# Patient Record
Sex: Male | Born: 1940 | Race: White | Hispanic: No | Marital: Married | Smoking: Never smoker
Health system: Southern US, Community
[De-identification: ages and names within clinical notes are randomized; demographics above are authoritative.]

## PROBLEM LIST (undated history)

## (undated) DIAGNOSIS — I2510 Atherosclerotic heart disease of native coronary artery without angina pectoris: Secondary | ICD-10-CM

## (undated) DIAGNOSIS — C4491 Basal cell carcinoma of skin, unspecified: Secondary | ICD-10-CM

## (undated) DIAGNOSIS — K469 Unspecified abdominal hernia without obstruction or gangrene: Secondary | ICD-10-CM

## (undated) DIAGNOSIS — E785 Hyperlipidemia, unspecified: Secondary | ICD-10-CM

## (undated) DIAGNOSIS — I10 Essential (primary) hypertension: Secondary | ICD-10-CM

## (undated) DIAGNOSIS — K649 Unspecified hemorrhoids: Secondary | ICD-10-CM

## (undated) DIAGNOSIS — M1990 Unspecified osteoarthritis, unspecified site: Secondary | ICD-10-CM

## (undated) DIAGNOSIS — E119 Type 2 diabetes mellitus without complications: Secondary | ICD-10-CM

## (undated) HISTORY — PX: COLONOSCOPY: SHX174

## (undated) HISTORY — PX: HERNIA REPAIR: SHX51

## (undated) HISTORY — PX: BASAL CELL CARCINOMA EXCISION: SHX1214

## (undated) HISTORY — DX: Essential (primary) hypertension: I10

## (undated) HISTORY — DX: Type 2 diabetes mellitus without complications: E11.9

## (undated) HISTORY — DX: Hyperlipidemia, unspecified: E78.5

## (undated) HISTORY — PX: HYDROCELE EXCISION / REPAIR: SUR1145

## (undated) SURGERY — CORONARY ARTERY BYPASS GRAFTING (CABG)
Anesthesia: General | Site: Chest

---

## 2009-09-24 ENCOUNTER — Ambulatory Visit: Payer: Self-pay | Admitting: Gastroenterology

## 2014-10-17 ENCOUNTER — Other Ambulatory Visit: Payer: Self-pay | Admitting: Family Medicine

## 2015-01-14 DIAGNOSIS — I10 Essential (primary) hypertension: Secondary | ICD-10-CM | POA: Insufficient documentation

## 2015-01-14 DIAGNOSIS — E1169 Type 2 diabetes mellitus with other specified complication: Secondary | ICD-10-CM | POA: Insufficient documentation

## 2015-01-22 ENCOUNTER — Other Ambulatory Visit: Payer: Self-pay | Admitting: Family Medicine

## 2015-01-22 ENCOUNTER — Ambulatory Visit (INDEPENDENT_AMBULATORY_CARE_PROVIDER_SITE_OTHER): Payer: Medicare Other | Admitting: Family Medicine

## 2015-01-22 ENCOUNTER — Encounter: Payer: Self-pay | Admitting: Family Medicine

## 2015-01-22 VITALS — BP 160/74 | HR 64 | Temp 97.9°F | Ht 70.2 in | Wt 197.0 lb

## 2015-01-22 DIAGNOSIS — Z0000 Encounter for general adult medical examination without abnormal findings: Secondary | ICD-10-CM

## 2015-01-22 DIAGNOSIS — E119 Type 2 diabetes mellitus without complications: Secondary | ICD-10-CM

## 2015-01-22 DIAGNOSIS — I10 Essential (primary) hypertension: Secondary | ICD-10-CM

## 2015-01-22 DIAGNOSIS — E785 Hyperlipidemia, unspecified: Secondary | ICD-10-CM

## 2015-01-22 DIAGNOSIS — Z23 Encounter for immunization: Secondary | ICD-10-CM

## 2015-01-22 LAB — MICROSCOPIC EXAMINATION: Renal Epithel, UA: NONE SEEN /hpf

## 2015-01-22 LAB — URINALYSIS, ROUTINE W REFLEX MICROSCOPIC
BILIRUBIN UA: NEGATIVE
Glucose, UA: NEGATIVE
Leukocytes, UA: NEGATIVE
Nitrite, UA: NEGATIVE
PROTEIN UA: NEGATIVE
Specific Gravity, UA: 1.03 (ref 1.005–1.030)
UUROB: 0.2 mg/dL (ref 0.2–1.0)
pH, UA: 5 (ref 5.0–7.5)

## 2015-01-22 LAB — BAYER DCA HB A1C WAIVED: HB A1C: 6.7 % (ref <7.0)

## 2015-01-22 MED ORDER — PIOGLITAZONE HCL 45 MG PO TABS: 45.0000 mg | ORAL_TABLET | Freq: Every day | ORAL | Status: DC

## 2015-01-22 MED ORDER — GLIPIZIDE-METFORMIN HCL 2.5-500 MG PO TABS
2.0000 | ORAL_TABLET | Freq: Two times a day (BID) | ORAL | Status: DC
Start: 2015-01-22 — End: 2015-08-02

## 2015-01-22 MED ORDER — EZETIMIBE-SIMVASTATIN 10-40 MG PO TABS: 1.0000 | ORAL_TABLET | Freq: Every day | ORAL | Status: DC

## 2015-01-22 MED ORDER — QUINAPRIL HCL 40 MG PO TABS: 40.0000 mg | ORAL_TABLET | Freq: Every day | ORAL | Status: DC

## 2015-01-22 MED ORDER — AMLODIPINE BESYLATE 5 MG PO TABS: 5.0000 mg | ORAL_TABLET | Freq: Every day | ORAL | Status: DC

## 2015-01-22 NOTE — Assessment & Plan Note (Signed)
Poor control will increase amlodipine from 2.5 to 5 recheck 1 month blood pressure

## 2015-01-22 NOTE — Progress Notes (Signed)
BP 160/74 mmHg  Pulse 64  Temp(Src) 97.9 F (36.6 C)  Ht 5' 10.2" (1.783 m)  Wt 197 lb (89.359 kg)  BMI 28.11 kg/m2  SpO2 99%   Subjective:    Patient ID: Adam Frost, male    DOB: 1940-11-01, 74 y.o.   MRN: [id number]  HPI: Adam Frost is a 74 y.o. male  Chief Complaint  Patient presents with  . Annual Exam   patient also follow-up diabetes which is been doing well no complaints no hypoglycemic spells not checking blood sugars at home. Hypercholesterol doing well no side effects from medications takes faithfully Blood pressure stopped metoprolol pulse is come up some has not been checking blood pressure at home the blood pressure noted to be high here. Takes his other medicines faithfully without problems.  Note some hemorrhoids intermittently with some bright red blood with bleeding very slight  Relevant past medical, surgical, family and social history reviewed and updated as indicated. Interim medical history since our last visit reviewed. Allergies and medications reviewed and updated.  Review of Systems  Constitutional: Negative.   HENT: Negative.   Eyes: Negative.   Respiratory: Negative.   Cardiovascular: Negative.   Gastrointestinal: Negative.   Endocrine: Negative.   Genitourinary: Negative.   Musculoskeletal: Negative.   Skin: Negative.   Allergic/Immunologic: Negative.   Neurological: Negative.   Hematological: Negative.   Psychiatric/Behavioral: Negative.     Per HPI unless specifically indicated above     Objective:    BP 160/74 mmHg  Pulse 64  Temp(Src) 97.9 F (36.6 C)  Ht 5' 10.2" (1.783 m)  Wt 197 lb (89.359 kg)  BMI 28.11 kg/m2  SpO2 99%  Wt Readings from Last 3 Encounters:  01/22/15 197 lb (89.359 kg)  10/01/14 197 lb (89.359 kg)    Physical Exam  Constitutional: He is oriented to person, place, and time. He appears well-developed and well-nourished.  HENT:  Head: Normocephalic and atraumatic.  Right Ear: External ear normal.   Left Ear: External ear normal.  Eyes: Conjunctivae and EOM are normal. Pupils are equal, round, and reactive to light.  Neck: Normal range of motion. Neck supple.  Cardiovascular: Normal rate, regular rhythm, normal heart sounds and intact distal pulses.   Pulmonary/Chest: Effort normal and breath sounds normal.  Abdominal: Soft. Bowel sounds are normal. There is no splenomegaly or hepatomegaly.  Genitourinary: Rectum normal and penis normal. Prostate is enlarged.  Inflamed hemorrhoidal skin tag with bleeding point identified externally  Musculoskeletal: Normal range of motion.  Neurological: He is alert and oriented to person, place, and time. He has normal reflexes.  Skin: No rash noted. No erythema.  Psychiatric: He has a normal mood and affect. His behavior is normal. Judgment and thought content normal.    No results found for this or any previous visit.    Assessment & Plan:   Problem List Items Addressed This Visit      Cardiovascular and Mediastinum   Hypertension    Poor control will increase amlodipine from 2.5 to 5 recheck 1 month blood pressure      Relevant Medications   quinapril (ACCUPRIL) 40 MG tablet   ezetimibe-simvastatin (VYTORIN) 10-40 MG per tablet   amLODipine (NORVASC) 5 MG tablet   Other Relevant Orders   CBC with Differential/Platelet   Comprehensive metabolic panel   TSH   Urinalysis, Routine w reflex microscopic (not at [facility])     Endocrine   Diabetes mellitus without complication   Relevant Medications  quinapril (ACCUPRIL) 40 MG tablet   pioglitazone (ACTOS) 45 MG tablet   glipiZIDE-metformin (METAGLIP) 2.5-500 MG per tablet   Other Relevant Orders   Bayer DCA Hb A1c Waived     Other   Hyperlipidemia    The current medical regimen is effective;  continue present plan and medications.       Relevant Medications   quinapril (ACCUPRIL) 40 MG tablet   ezetimibe-simvastatin (VYTORIN) 10-40 MG per tablet   amLODipine (NORVASC) 5 MG tablet    Other Relevant Orders   Lipid panel   Comprehensive metabolic panel   TSH    Other Visit Diagnoses    Immunization due    -  Primary    Relevant Orders    Flu Vaccine QUAD 36+ mos PF IM (Fluarix & Fluzone Quad PF) (Completed)    PE (physical exam), annual        Relevant Orders    Lipid panel    CBC with Differential/Platelet    Comprehensive metabolic panel    TSH    Urinalysis, Routine w reflex microscopic (not at [facility])    PSA        Follow up plan: Return in about 4 weeks (around 02/19/2015), or if symptoms worsen or fail to improve, for BP check BMP.

## 2015-01-22 NOTE — Assessment & Plan Note (Signed)
The current medical regimen is effective;  continue present plan and medications.  

## 2015-01-23 ENCOUNTER — Encounter: Payer: Self-pay | Admitting: Family Medicine

## 2015-01-23 LAB — CBC WITH DIFFERENTIAL/PLATELET
BASOS: 1 %
Basophils Absolute: 0 10*3/uL (ref 0.0–0.2)
EOS (ABSOLUTE): 0.1 10*3/uL (ref 0.0–0.4)
EOS: 2 %
HEMATOCRIT: 44.1 % (ref 37.5–51.0)
Hemoglobin: 14.5 g/dL (ref 12.6–17.7)
Immature Grans (Abs): 0 10*3/uL (ref 0.0–0.1)
Immature Granulocytes: 0 %
LYMPHS ABS: 0.7 10*3/uL (ref 0.7–3.1)
Lymphs: 20 %
MCH: 30.7 pg (ref 26.6–33.0)
MCHC: 32.9 g/dL (ref 31.5–35.7)
MCV: 93 fL (ref 79–97)
MONOS ABS: 0.3 10*3/uL (ref 0.1–0.9)
Monocytes: 10 %
Neutrophils Absolute: 2.2 10*3/uL (ref 1.4–7.0)
Neutrophils: 67 %
Platelets: 151 10*3/uL (ref 150–379)
RBC: 4.73 x10E6/uL (ref 4.14–5.80)
RDW: 14.2 % (ref 12.3–15.4)
WBC: 3.3 10*3/uL — ABNORMAL LOW (ref 3.4–10.8)

## 2015-01-23 LAB — COMPREHENSIVE METABOLIC PANEL
ALK PHOS: 73 IU/L (ref 39–117)
ALT: 13 IU/L (ref 0–44)
AST: 20 IU/L (ref 0–40)
Albumin/Globulin Ratio: 1.6 (ref 1.1–2.5)
Albumin: 4.1 g/dL (ref 3.5–4.8)
BUN/Creatinine Ratio: 14 (ref 10–22)
BUN: 13 mg/dL (ref 8–27)
Bilirubin Total: 0.9 mg/dL (ref 0.0–1.2)
CO2: 25 mmol/L (ref 18–29)
Calcium: 9.4 mg/dL (ref 8.6–10.2)
Chloride: 99 mmol/L (ref 97–108)
Creatinine, Ser: 0.93 mg/dL (ref 0.76–1.27)
GFR calc Af Amer: 92 mL/min/{1.73_m2} (ref >59)
GFR, EST NON AFRICAN AMERICAN: 79 mL/min/{1.73_m2} (ref >59)
GLOBULIN, TOTAL: 2.5 g/dL (ref 1.5–4.5)
Glucose: 119 mg/dL — ABNORMAL HIGH (ref 65–99)
Potassium: 4 mmol/L (ref 3.5–5.2)
SODIUM: 140 mmol/L (ref 134–144)
Total Protein: 6.6 g/dL (ref 6.0–8.5)

## 2015-01-23 LAB — LIPID PANEL
CHOLESTEROL TOTAL: 137 mg/dL (ref 100–199)
Chol/HDL Ratio: 3 ratio units (ref 0.0–5.0)
HDL: 45 mg/dL (ref >39)
LDL Calculated: 76 mg/dL (ref 0–99)
Triglycerides: 78 mg/dL (ref 0–149)
VLDL CHOLESTEROL CAL: 16 mg/dL (ref 5–40)

## 2015-01-23 LAB — TSH: TSH: 2.17 u[IU]/mL (ref 0.450–4.500)

## 2015-01-23 LAB — PSA: PROSTATE SPECIFIC AG, SERUM: 0.4 ng/mL (ref 0.0–4.0)

## 2015-02-17 ENCOUNTER — Encounter: Payer: Self-pay | Admitting: Family Medicine

## 2015-02-17 ENCOUNTER — Ambulatory Visit (INDEPENDENT_AMBULATORY_CARE_PROVIDER_SITE_OTHER): Payer: Medicare Other | Admitting: Family Medicine

## 2015-02-17 VITALS — BP 103/62 | HR 65 | Temp 98.7°F | Ht 71.0 in | Wt 198.0 lb

## 2015-02-17 DIAGNOSIS — Z1211 Encounter for screening for malignant neoplasm of colon: Secondary | ICD-10-CM

## 2015-02-17 DIAGNOSIS — I10 Essential (primary) hypertension: Secondary | ICD-10-CM

## 2015-02-17 LAB — MICROALBUMIN, URINE WAIVED
Creatinine, Urine Waived: 300 mg/dL (ref 10–300)
Microalb, Ur Waived: 150 mg/L — ABNORMAL HIGH (ref 0–19)

## 2015-02-17 NOTE — Progress Notes (Signed)
BP 103/62 mmHg  Pulse 65  Temp(Src) 98.7 F (37.1 C)  Ht 5\' 11"  (1.803 m)  Wt 198 lb (89.812 kg)  BMI 27.63 kg/m2  SpO2 99%   Subjective:    Patient ID: Adam Frost, male    DOB: 1940-08-08, 74 y.o.   MRN: [id number]  HPI: Adam Frost is a 74 y.o. male  Chief Complaint  Patient presents with  . Hypertension   patient doing well with change of amlodipine from 2.5 mg a day to 5 mg a day.  No low blood pressure spells, no lightheadedness or dizzy. Taking medications faithfully no ankle swelling. Diabetes remains doing well with no complaints or problems no change with change in medicine. Lipids also remained doing well with no complaints or problems with medicines. Relevant past medical, surgical, family and social history reviewed and updated as indicated. Interim medical history since our last visit reviewed. Allergies and medications reviewed and updated.  Review of Systems  Constitutional: Negative.   Respiratory: Negative.   Cardiovascular: Negative.     Per HPI unless specifically indicated above     Objective:    BP 103/62 mmHg  Pulse 65  Temp(Src) 98.7 F (37.1 C)  Ht 5\' 11"  (1.803 m)  Wt 198 lb (89.812 kg)  BMI 27.63 kg/m2  SpO2 99%  Wt Readings from Last 3 Encounters:  02/17/15 198 lb (89.812 kg)  01/22/15 197 lb (89.359 kg)  10/01/14 197 lb (89.359 kg)    Physical Exam  Constitutional: He is oriented to person, place, and time. He appears well-developed and well-nourished. No distress.  HENT:  Head: Normocephalic and atraumatic.  Right Ear: Hearing normal.  Left Ear: Hearing normal.  Nose: Nose normal.  Eyes: Conjunctivae and lids are normal. Right eye exhibits no discharge. Left eye exhibits no discharge. No scleral icterus.  Cardiovascular: Normal rate, regular rhythm and normal heart sounds.   Pulmonary/Chest: Effort normal and breath sounds normal. No respiratory distress.  Musculoskeletal: Normal range of motion.  Neurological: He is  alert and oriented to person, place, and time.  Skin: Skin is intact. No rash noted.  Psychiatric: He has a normal mood and affect. His speech is normal and behavior is normal. Judgment and thought content normal. Cognition and memory are normal.    Results for orders placed or performed in visit on 01/22/15  Microscopic Examination  Result Value Ref Range   WBC, UA 0-5 0 -  5 /hpf   RBC, UA 0-2 0 -  2 /hpf   Epithelial Cells (non renal) 0-10 0 - 10 /hpf   Renal Epithel, UA None seen None seen /hpf   Casts Present (A) None seen /lpf   Cast Type Granular casts N/A   Mucus, UA Present Not Estab.   Bacteria, UA Few None seen/Few  Bayer DCA Hb A1c Waived  Result Value Ref Range   Bayer DCA Hb A1c Waived 6.7 <7.0 %  Lipid panel  Result Value Ref Range   Cholesterol, Total 137 100 - 199 mg/dL   Triglycerides 78 0 - 149 mg/dL   HDL 45 >39 mg/dL   VLDL Cholesterol Cal 16 5 - 40 mg/dL   LDL Calculated 76 0 - 99 mg/dL   Chol/HDL Ratio 3.0 0.0 - 5.0 ratio units  CBC with Differential/Platelet  Result Value Ref Range   WBC 3.3 (L) 3.4 - 10.8 x10E3/uL   RBC 4.73 4.14 - 5.80 x10E6/uL   Hemoglobin 14.5 12.6 - 17.7 g/dL  Hematocrit 44.1 37.5 - 51.0 %   MCV 93 79 - 97 fL   MCH 30.7 26.6 - 33.0 pg   MCHC 32.9 31.5 - 35.7 g/dL   RDW 14.2 12.3 - 15.4 %   Platelets 151 150 - 379 x10E3/uL   Neutrophils 67 %   Lymphs 20 %   Monocytes 10 %   Eos 2 %   Basos 1 %   Neutrophils Absolute 2.2 1.4 - 7.0 x10E3/uL   Lymphocytes Absolute 0.7 0.7 - 3.1 x10E3/uL   Monocytes Absolute 0.3 0.1 - 0.9 x10E3/uL   EOS (ABSOLUTE) 0.1 0.0 - 0.4 x10E3/uL   Basophils Absolute 0.0 0.0 - 0.2 x10E3/uL   Immature Granulocytes 0 %   Immature Grans (Abs) 0.0 0.0 - 0.1 x10E3/uL  Comprehensive metabolic panel  Result Value Ref Range   Glucose 119 (H) 65 - 99 mg/dL   BUN 13 8 - 27 mg/dL   Creatinine, Ser 0.93 0.76 - 1.27 mg/dL   GFR calc non Af Amer 79 >59 mL/min/1.73   GFR calc Af Amer 92 >59 mL/min/1.73    BUN/Creatinine Ratio 14 10 - 22   Sodium 140 134 - 144 mmol/L   Potassium 4.0 3.5 - 5.2 mmol/L   Chloride 99 97 - 108 mmol/L   CO2 25 18 - 29 mmol/L   Calcium 9.4 8.6 - 10.2 mg/dL   Total Protein 6.6 6.0 - 8.5 g/dL   Albumin 4.1 3.5 - 4.8 g/dL   Globulin, Total 2.5 1.5 - 4.5 g/dL   Albumin/Globulin Ratio 1.6 1.1 - 2.5   Bilirubin Total 0.9 0.0 - 1.2 mg/dL   Alkaline Phosphatase 73 39 - 117 IU/L   AST 20 0 - 40 IU/L   ALT 13 0 - 44 IU/L  TSH  Result Value Ref Range   TSH 2.170 0.450 - 4.500 uIU/mL  Urinalysis, Routine w reflex microscopic (not at [facility])  Result Value Ref Range   Specific Gravity, UA 1.030 1.005 - 1.030   pH, UA 5.0 5.0 - 7.5   Color, UA Yellow Yellow   Appearance Ur Clear Clear   Leukocytes, UA Negative Negative   Protein, UA Negative Negative/Trace   Glucose, UA Negative Negative   Ketones, UA Trace (A) Negative   RBC, UA Trace (A) Negative   Bilirubin, UA Negative Negative   Urobilinogen, Ur 0.2 0.2 - 1.0 mg/dL   Nitrite, UA Negative Negative   Microscopic Examination See below:   PSA  Result Value Ref Range   Prostate Specific Ag, Serum 0.4 0.0 - 4.0 ng/mL      Assessment & Plan:   Problem List Items Addressed This Visit      Cardiovascular and Mediastinum   Essential hypertension    The current medical regimen is effective;  continue present plan and medications.        Other Visit Diagnoses    Essential hypertension, benign    -  Primary    Relevant Orders    Basic metabolic panel    Microalbumin, Urine Waived    Colon cancer screening        Relevant Orders    Ambulatory referral to Gastroenterology        Follow up plan: Return in about 2 months (around 04/19/2015), or if symptoms worsen or fail to improve, for Recheck medication medical problems and hemoglobin A1c.

## 2015-02-17 NOTE — Assessment & Plan Note (Signed)
The current medical regimen is effective;  continue present plan and medications.  

## 2015-02-18 ENCOUNTER — Encounter: Payer: Self-pay | Admitting: Family Medicine

## 2015-02-18 LAB — BASIC METABOLIC PANEL
BUN/Creatinine Ratio: 15 (ref 10–22)
BUN: 15 mg/dL (ref 8–27)
CALCIUM: 9.6 mg/dL (ref 8.6–10.2)
CO2: 24 mmol/L (ref 18–29)
CREATININE: 1 mg/dL (ref 0.76–1.27)
Chloride: 101 mmol/L (ref 97–106)
GFR calc Af Amer: 84 mL/min/{1.73_m2} (ref >59)
GFR, EST NON AFRICAN AMERICAN: 73 mL/min/{1.73_m2} (ref >59)
Glucose: 180 mg/dL — ABNORMAL HIGH (ref 65–99)
Potassium: 4 mmol/L (ref 3.5–5.2)
Sodium: 139 mmol/L (ref 136–144)

## 2015-02-25 ENCOUNTER — Telehealth: Payer: Self-pay

## 2015-02-25 NOTE — Telephone Encounter (Signed)
Called patient in regards to colocancer screening. No answer.

## 2015-04-04 DIAGNOSIS — E785 Hyperlipidemia, unspecified: Secondary | ICD-10-CM | POA: Insufficient documentation

## 2015-04-16 ENCOUNTER — Encounter: Payer: Self-pay | Admitting: *Deleted

## 2015-04-17 ENCOUNTER — Ambulatory Visit
Admission: RE | Admit: 2015-04-17 | Discharge: 2015-04-17 | Disposition: A | Discharge status: Home / Self Care | Payer: Medicare Other | Source: Ambulatory Visit | Attending: Gastroenterology | Admitting: Gastroenterology

## 2015-04-17 ENCOUNTER — Encounter: Payer: Self-pay | Admitting: *Deleted

## 2015-04-17 ENCOUNTER — Encounter
Admission: RE | Disposition: A | Discharge status: Home / Self Care | Payer: Self-pay | Source: Ambulatory Visit | Attending: Gastroenterology

## 2015-04-17 DIAGNOSIS — Z1211 Encounter for screening for malignant neoplasm of colon: Secondary | ICD-10-CM | POA: Insufficient documentation

## 2015-04-17 DIAGNOSIS — D128 Benign neoplasm of rectum: Secondary | ICD-10-CM | POA: Insufficient documentation

## 2015-04-17 DIAGNOSIS — I10 Essential (primary) hypertension: Secondary | ICD-10-CM | POA: Diagnosis not present

## 2015-04-17 DIAGNOSIS — Z79899 Other long term (current) drug therapy: Secondary | ICD-10-CM | POA: Insufficient documentation

## 2015-04-17 DIAGNOSIS — Z7982 Long term (current) use of aspirin: Secondary | ICD-10-CM | POA: Insufficient documentation

## 2015-04-17 DIAGNOSIS — E119 Type 2 diabetes mellitus without complications: Secondary | ICD-10-CM | POA: Diagnosis not present

## 2015-04-17 DIAGNOSIS — Z800 Family history of malignant neoplasm of digestive organs: Secondary | ICD-10-CM | POA: Insufficient documentation

## 2015-04-17 DIAGNOSIS — E785 Hyperlipidemia, unspecified: Secondary | ICD-10-CM | POA: Diagnosis not present

## 2015-04-17 HISTORY — PX: COLONOSCOPY WITH PROPOFOL: SHX5780

## 2015-04-17 HISTORY — DX: Unspecified abdominal hernia without obstruction or gangrene: K46.9

## 2015-04-17 HISTORY — DX: Unspecified hemorrhoids: K64.9

## 2015-04-17 LAB — GLUCOSE, CAPILLARY: GLUCOSE-CAPILLARY: 163 mg/dL — AB (ref 65–99)

## 2015-04-17 SURGERY — COLONOSCOPY WITH PROPOFOL
Anesthesia: General

## 2015-04-17 MED ORDER — SODIUM CHLORIDE 0.9 % IV SOLN: INTRAVENOUS | Status: DC

## 2015-04-17 MED ORDER — SODIUM CHLORIDE 0.9 % IV SOLN
INTRAVENOUS | Status: DC
Start: 2015-04-17 — End: 2015-04-17
  Administered 2015-04-17: 08:00:00 via INTRAVENOUS

## 2015-04-17 MED ORDER — FENTANYL CITRATE (PF) 100 MCG/2ML IJ SOLN
INTRAMUSCULAR | Status: AC
  Filled 2015-04-17: qty 4

## 2015-04-17 MED ORDER — MIDAZOLAM HCL 5 MG/5ML IJ SOLN
INTRAMUSCULAR | Status: DC | PRN
  Administered 2015-04-17 (×3): 2 mg via INTRAVENOUS
  Administered 2015-04-17: 1 mg via INTRAVENOUS

## 2015-04-17 MED ORDER — PROMETHAZINE HCL 25 MG/ML IJ SOLN
INTRAMUSCULAR | Status: AC
  Filled 2015-04-17: qty 1

## 2015-04-17 MED ORDER — MIDAZOLAM HCL 5 MG/5ML IJ SOLN
INTRAMUSCULAR | Status: AC
  Filled 2015-04-17: qty 10

## 2015-04-17 MED ORDER — FENTANYL CITRATE (PF) 100 MCG/2ML IJ SOLN
INTRAMUSCULAR | Status: DC | PRN
  Administered 2015-04-17 (×2): 50 ug via INTRAVENOUS

## 2015-04-17 NOTE — Op Note (Signed)
[facility] Patient Name: Adam Frost Procedure Date: 04/17/2015 8:12 AM MRN: [id number] Account #: [id number] Date of Birth: 1940-12-26 Admit Type: Outpatient Age: 74 Room: [facility] ENDO ROOM 4 Gender: Male Note Status: Finalized Procedure:         Colonoscopy Indications:       Screening patient at increased risk: Family history of                     colorectal cancer in multiple 1st-degree relatives Providers:         Lupita Dawn. Candace Cruise, MD Referring MD:      Guadalupe Maple, MD (Referring MD) Medicines:         Midazolam 7 mg IV, Fentanyl 100 micrograms IV Complications:     No immediate complications. Procedure:         Pre-Anesthesia Assessment:                    - Prior to the procedure, a History and Physical was                     performed, and patient medications, allergies and                     sensitivities were reviewed. The patient's tolerance of                     previous anesthesia was reviewed.                    - The risks and benefits of the procedure and the sedation                     options and risks were discussed with the patient. All                     questions were answered and informed consent was obtained.                    - After reviewing the risks and benefits, the patient was                     deemed in satisfactory condition to undergo the procedure.                    After obtaining informed consent, the colonoscope was                     passed under direct vision. Throughout the procedure, the                     patient's blood pressure, pulse, and oxygen saturations                     were monitored continuously. The Olympus CF-Q160AL                     colonoscope (S#. [id number]) was introduced through the anus                     and advanced to the the cecum, identified by appendiceal                     orifice and ileocecal valve. The colonoscopy was performed  with difficulty  due to restricted mobility of the colon.                     Successful completion of the procedure was aided by using                     manual pressure. The patient tolerated the procedure well.                     The quality of the bowel preparation was fair. Findings:      A small polyp was found in the rectum. The polyp was sessile. The polyp       was removed with a cold snare. Resection and retrieval were complete.      The exam was otherwise without abnormality. Impression:        - One small polyp in the rectum. Resected and retrieved.                    - The examination was otherwise normal. Recommendation:    - Discharge patient to home.                    - Await pathology results.                    - Repeat colonoscopy in 5 years for surveillance based on                     pathology results.                    - The findings and recommendations were discussed with the                     patient. Procedure Code(s): --- Professional ---                    [phone], Colonoscopy, flexible; with removal of tumor(s),                     polyp(s), or other lesion(s) by snare technique Diagnosis Code(s): --- Professional ---                    Z80.0, Family history of malignant neoplasm of digestive                     organs                    K62.1, Rectal polyp CPT copyright 2014 American Medical Association. All rights reserved. The codes documented in this report are preliminary and upon coder review may  be revised to meet current compliance requirements. Hulen Luster, MD 04/17/2015 8:40:43 AM This report has been signed electronically. Number of Addenda: 0 Note Initiated On: 04/17/2015 8:12 AM Scope Withdrawal Time: 0 hours 11 minutes 34 seconds  Total Procedure Duration: 0 hours 17 minutes 47 seconds       [facility]

## 2015-04-17 NOTE — H&P (Signed)
    Primary Care Physician:  Golden Pop, MD Primary Gastroenterologist:  Dr. Candace Cruise  Pre-Procedure History & Physical: HPI:  Adam Frost is a 73 y.o. male is here for an colonoscopy.   Past Medical History  Diagnosis Date  . Diabetes mellitus without complication ([place])   . Hyperlipidemia   . Abdominal hernia   . Hemorrhoid   . Hypertension     Past Surgical History  Procedure Laterality Date  . Hernia repair    . Hydrocele excision / repair    . Colonoscopy      Prior to Admission medications   Medication Sig Start Date End Date Taking? Authorizing Provider  amLODipine (NORVASC) 5 MG tablet Take 1 tablet (5 mg total) by mouth daily. 01/22/15  Yes Guadalupe Maple, MD  ezetimibe-simvastatin (VYTORIN) 10-40 MG per tablet Take 1 tablet by mouth daily. 01/22/15  Yes Guadalupe Maple, MD  glipiZIDE-metformin (METAGLIP) 2.5-500 MG per tablet Take 2 tablets by mouth 2 (two) times daily before a meal. 01/22/15  Yes Guadalupe Maple, MD  pioglitazone (ACTOS) 45 MG tablet Take 1 tablet (45 mg total) by mouth daily. 01/22/15  Yes Guadalupe Maple, MD  quinapril (ACCUPRIL) 40 MG tablet Take 1 tablet (40 mg total) by mouth at bedtime. 01/22/15  Yes Guadalupe Maple, MD  aspirin EC 81 MG tablet Take 81 mg by mouth daily.    Historical Provider, MD    Allergies as of 04/09/2015  . (No Known Allergies)    Family History  Problem Relation Age of Onset  . Heart disease Mother   . Cancer Brother     colon    Social History   Social History  . Marital Status: Married    Spouse Name: N/A  . Number of Children: N/A  . Years of Education: N/A   Occupational History  . Not on file.   Social History Main Topics  . Smoking status: Never Smoker   . Smokeless tobacco: Never Used  . Alcohol Use: No  . Drug Use: No  . Sexual Activity: Not on file   Other Topics Concern  . Not on file   Social History Narrative    Review of Systems: See HPI, otherwise negative ROS  Physical Exam: BP  142/63 mmHg  Pulse 57  Temp(Src) 97.9 F (36.6 C) (Tympanic)  Resp 12  Ht 5\' 9"  (1.753 m)  Wt 90.719 kg (200 lb)  BMI 29.52 kg/m2  SpO2 100% General:   Alert,  pleasant and cooperative in NAD Head:  Normocephalic and atraumatic. Neck:  Supple; no masses or thyromegaly. Lungs:  Clear throughout to auscultation.    Heart:  Regular rate and rhythm. Abdomen:  Soft, nontender and nondistended. Normal bowel sounds, without guarding, and without rebound.   Neurologic:  Alert and  oriented x4;  grossly normal neurologically.  Impression/Plan: Adam Frost is here for an colonoscopy to be performed for family hx of colon cancer.  Risks, benefits, limitations, and alternatives regarding  colonoscopy have been reviewed with the patient.  Questions have been answered.  All parties agreeable.   Jashae Wiggs, Lupita Dawn, MD  04/17/2015, 8:13 AM

## 2015-04-18 LAB — SURGICAL PATHOLOGY

## 2015-04-19 ENCOUNTER — Encounter: Payer: Self-pay | Admitting: Gastroenterology

## 2015-04-24 ENCOUNTER — Encounter: Payer: Self-pay | Admitting: Family Medicine

## 2015-04-24 ENCOUNTER — Ambulatory Visit (INDEPENDENT_AMBULATORY_CARE_PROVIDER_SITE_OTHER): Payer: Medicare Other | Admitting: Family Medicine

## 2015-04-24 VITALS — BP 134/77 | HR 51 | Temp 97.5°F | Ht 70.2 in | Wt 200.0 lb

## 2015-04-24 DIAGNOSIS — I10 Essential (primary) hypertension: Secondary | ICD-10-CM

## 2015-04-24 DIAGNOSIS — E785 Hyperlipidemia, unspecified: Secondary | ICD-10-CM

## 2015-04-24 DIAGNOSIS — Z23 Encounter for immunization: Secondary | ICD-10-CM | POA: Diagnosis not present

## 2015-04-24 DIAGNOSIS — E119 Type 2 diabetes mellitus without complications: Secondary | ICD-10-CM | POA: Diagnosis not present

## 2015-04-24 LAB — BAYER DCA HB A1C WAIVED: HB A1C: 6.6 % (ref <7.0)

## 2015-04-24 NOTE — Assessment & Plan Note (Signed)
The current medical regimen is effective;  continue present plan and medications.  

## 2015-04-24 NOTE — Progress Notes (Signed)
BP 134/77 mmHg  Pulse 51  Temp(Src) 97.5 F (36.4 C)  Ht 5' 10.2" (1.783 m)  Wt 200 lb (90.719 kg)  BMI 28.54 kg/m2  SpO2 100%   Subjective:    Patient ID: Adam Frost, male    DOB: Oct 18, 1940, 74 y.o.   MRN: [id number]  HPI: Adam Frost is a 74 y.o. male  Chief Complaint  Patient presents with  . Diabetes  . Hyperlipidemia  . Hypertension   She doing well no hypoglycemic spells no fluid retention no side effects from medications Blood pressure cholesterol doing well again with no side effects and taking medicines faithfully  Relevant past medical, surgical, family and social history reviewed and updated as indicated. Interim medical history since our last visit reviewed. Allergies and medications reviewed and updated.  Review of Systems  Constitutional: Negative.   Respiratory: Negative.   Cardiovascular: Negative.     Per HPI unless specifically indicated above     Objective:    BP 134/77 mmHg  Pulse 51  Temp(Src) 97.5 F (36.4 C)  Ht 5' 10.2" (1.783 m)  Wt 200 lb (90.719 kg)  BMI 28.54 kg/m2  SpO2 100%  Wt Readings from Last 3 Encounters:  04/24/15 200 lb (90.719 kg)  04/17/15 200 lb (90.719 kg)  02/17/15 198 lb (89.812 kg)    Physical Exam  Constitutional: He is oriented to person, place, and time. He appears well-developed and well-nourished. No distress.  HENT:  Head: Normocephalic and atraumatic.  Right Ear: Hearing normal.  Left Ear: Hearing normal.  Nose: Nose normal.  Eyes: Conjunctivae and lids are normal. Right eye exhibits no discharge. Left eye exhibits no discharge. No scleral icterus.  Cardiovascular: Normal rate, regular rhythm and normal heart sounds.   Pulmonary/Chest: Effort normal and breath sounds normal. No respiratory distress.  Musculoskeletal: Normal range of motion.  Neurological: He is alert and oriented to person, place, and time.  Skin: Skin is intact. No rash noted.  Psychiatric: He has a normal mood and affect. His  speech is normal and behavior is normal. Judgment and thought content normal. Cognition and memory are normal.    Results for orders placed or performed during the hospital encounter of 04/17/15  Glucose, capillary  Result Value Ref Range   Glucose-Capillary 163 (H) 65 - 99 mg/dL   Comment 1 Document in Chart   Surgical pathology  Result Value Ref Range   SURGICAL PATHOLOGY      Surgical Pathology CASE: [id number] PATIENT: Adam Frost Surgical Pathology Report     SPECIMEN SUBMITTED: A. Rectum polyp, cold snare  CLINICAL HISTORY: None provided  PRE-OPERATIVE DIAGNOSIS: FM HX colon CA  POST-OPERATIVE DIAGNOSIS: Polyp     DIAGNOSIS: A. RECTUM POLYP; COLD SNARE: - TUBULAR ADENOMA. - NEGATIVE FOR HIGH-GRADE DYSPLASIA AND MALIGNANCY.   GROSS DESCRIPTION:  A. Labeled: cold snare rectal polyp  Tissue fragment(s): 3  Size: 0.1-0.6 cm  Description: pink-tan fragment  Entirely submitted in one cassette(s).    Final Diagnosis performed by Delorse Lek, MD.  Electronically signed 04/18/2015 12:17:48PM    The electronic signature indicates that the named Attending Pathologist has evaluated the specimen  Technical component performed at [facility], [address] [place], [place] [address] Lab: [phone] Dir: Darrick Penna. Evette Doffing, MD  Professional component performed at [facility], [facility], [address], [place], [place] [address] Lab: [phone] Dir: Dellia Nims. Reuel Derby, MD        Assessment & Plan:   Problem List Items  Addressed This Visit      Cardiovascular and Mediastinum   Essential hypertension    The current medical regimen is effective;  continue present plan and medications.         Endocrine   Diabetes mellitus without complication ([place]) - Primary    The current medical regimen is effective;  continue present plan and medications.       Relevant Orders   Bayer DCA Hb A1c Waived     Other   Hyperlipidemia     The current medical regimen is effective;  continue present plan and medications.        Other Visit Diagnoses    Immunization due        Relevant Orders    Pneumococcal polysaccharide vaccine 23-valent greater than or equal to 2yo subcutaneous/IM (Completed)        Follow up plan: Return in about 3 months (around 07/23/2015) for BMP, lipids, ALT, AST and hemoglobin A1c.Marland Kitchen

## 2015-06-17 ENCOUNTER — Encounter: Payer: Self-pay | Admitting: Family Medicine

## 2015-08-02 ENCOUNTER — Other Ambulatory Visit: Payer: Self-pay | Admitting: Family Medicine

## 2015-08-06 ENCOUNTER — Ambulatory Visit: Payer: Medicare Other | Admitting: Family Medicine

## 2015-08-11 ENCOUNTER — Ambulatory Visit (INDEPENDENT_AMBULATORY_CARE_PROVIDER_SITE_OTHER): Payer: Medicare Other | Admitting: Family Medicine

## 2015-08-11 ENCOUNTER — Encounter: Payer: Self-pay | Admitting: Family Medicine

## 2015-08-11 VITALS — BP 120/66 | HR 52 | Temp 98.2°F | Ht 70.2 in | Wt 198.0 lb

## 2015-08-11 DIAGNOSIS — I10 Essential (primary) hypertension: Secondary | ICD-10-CM | POA: Diagnosis not present

## 2015-08-11 DIAGNOSIS — E785 Hyperlipidemia, unspecified: Secondary | ICD-10-CM | POA: Diagnosis not present

## 2015-08-11 DIAGNOSIS — E119 Type 2 diabetes mellitus without complications: Secondary | ICD-10-CM | POA: Diagnosis not present

## 2015-08-11 LAB — LP+ALT+AST PICCOLO, WAIVED
ALT (SGPT) Piccolo, Waived: 13 U/L (ref 10–47)
AST (SGOT) PICCOLO, WAIVED: 22 U/L (ref 11–38)
CHOL/HDL RATIO PICCOLO,WAIVE: 2.8 mg/dL
CHOLESTEROL PICCOLO, WAIVED: 136 mg/dL (ref <200)
HDL CHOL PICCOLO, WAIVED: 48 mg/dL — AB (ref >59)
LDL Chol Calc Piccolo Waived: 74 mg/dL (ref <100)
TRIGLYCERIDES PICCOLO,WAIVED: 66 mg/dL (ref <150)
VLDL Chol Calc Piccolo,Waive: 13 mg/dL (ref <30)

## 2015-08-11 LAB — BAYER DCA HB A1C WAIVED: HB A1C (BAYER DCA - WAIVED): 6.7 % (ref <7.0)

## 2015-08-11 NOTE — Assessment & Plan Note (Signed)
The current medical regimen is effective;  continue present plan and medications.  

## 2015-08-11 NOTE — Progress Notes (Signed)
   BP 120/66 mmHg  Pulse 52  Temp(Src) 98.2 F (36.8 C)  Ht 5' 10.2" (1.783 m)  Wt 198 lb (89.812 kg)  BMI 28.25 kg/m2  SpO2 97%   Subjective:    Patient ID: Adam Frost, male    DOB: [birth year], 75 y.o.   MRN: [id number]  HPI: Adam Frost is a 75 y.o. male  Chief Complaint  Patient presents with  . Diabetes  . Hyperlipidemia  . Hypertension   Patient doing well with medications takes faithfully with no side effects noted low blood sugar spells. Blood pressure good control, Cholesterol good control, Diabetes good control didn't check blood sugar much but does well. Wants a new glucose meter  Relevant past medical, surgical, family and social history reviewed and updated as indicated. Interim medical history since our last visit reviewed. Allergies and medications reviewed and updated.  Review of Systems  Constitutional: Negative.   Respiratory: Negative.   Cardiovascular: Negative.     Per HPI unless specifically indicated above     Objective:    BP 120/66 mmHg  Pulse 52  Temp(Src) 98.2 F (36.8 C)  Ht 5' 10.2" (1.783 m)  Wt 198 lb (89.812 kg)  BMI 28.25 kg/m2  SpO2 97%  Wt Readings from Last 3 Encounters:  08/11/15 198 lb (89.812 kg)  04/24/15 200 lb (90.719 kg)  04/17/15 200 lb (90.719 kg)    Physical Exam  Constitutional: He is oriented to person, place, and time. He appears well-developed and well-nourished. No distress.  HENT:  Head: Normocephalic and atraumatic.  Right Ear: Hearing normal.  Left Ear: Hearing normal.  Nose: Nose normal.  Eyes: Conjunctivae and lids are normal. Right eye exhibits no discharge. Left eye exhibits no discharge. No scleral icterus.  Cardiovascular: Normal rate, regular rhythm and normal heart sounds.   Pulmonary/Chest: Effort normal and breath sounds normal. No respiratory distress.  Musculoskeletal: Normal range of motion.  Neurological: He is alert and oriented to person, place, and time.  Skin: Skin is intact.  No rash noted.  Psychiatric: He has a normal mood and affect. His speech is normal and behavior is normal. Judgment and thought content normal. Cognition and memory are normal.    Results for orders placed or performed in visit on 04/24/15  Bayer DCA Hb A1c Waived  Result Value Ref Range   Bayer DCA Hb A1c Waived 6.6 <7.0 %      Assessment & Plan:   Problem List Items Addressed This Visit      Cardiovascular and Mediastinum   Essential hypertension - Primary    The current medical regimen is effective;  continue present plan and medications.       Relevant Orders   Basic metabolic panel     Endocrine   Diabetes mellitus without complication ([place])    The current medical regimen is effective;  continue present plan and medications.       Relevant Orders   Bayer DCA Hb A1c Waived     Other   Hyperlipidemia    The current medical regimen is effective;  continue present plan and medications.       Relevant Orders   LP+ALT+AST [place], Waived       Follow up plan: Return in about 3 months (around 11/10/2015) for a1c.

## 2015-08-12 ENCOUNTER — Encounter: Payer: Self-pay | Admitting: Family Medicine

## 2015-08-12 LAB — BASIC METABOLIC PANEL
BUN/Creatinine Ratio: 16 (ref 10–24)
BUN: 14 mg/dL (ref 8–27)
CALCIUM: 9.3 mg/dL (ref 8.6–10.2)
CHLORIDE: 101 mmol/L (ref 96–106)
CO2: 24 mmol/L (ref 18–29)
Creatinine, Ser: 0.86 mg/dL (ref 0.76–1.27)
GFR calc non Af Amer: 84 mL/min/{1.73_m2} (ref >59)
GFR, EST AFRICAN AMERICAN: 97 mL/min/{1.73_m2} (ref >59)
Glucose: 99 mg/dL (ref 65–99)
POTASSIUM: 4.3 mmol/L (ref 3.5–5.2)
Sodium: 141 mmol/L (ref 134–144)

## 2015-08-28 LAB — HM DIABETES EYE EXAM

## 2015-11-11 ENCOUNTER — Encounter: Payer: Self-pay | Admitting: Family Medicine

## 2015-11-11 ENCOUNTER — Other Ambulatory Visit: Payer: Self-pay | Admitting: Family Medicine

## 2015-11-11 ENCOUNTER — Ambulatory Visit (INDEPENDENT_AMBULATORY_CARE_PROVIDER_SITE_OTHER): Payer: Medicare Other | Admitting: Family Medicine

## 2015-11-11 VITALS — BP 134/71 | HR 50 | Temp 98.0°F | Wt 200.0 lb

## 2015-11-11 DIAGNOSIS — Z23 Encounter for immunization: Secondary | ICD-10-CM | POA: Diagnosis not present

## 2015-11-11 DIAGNOSIS — E119 Type 2 diabetes mellitus without complications: Secondary | ICD-10-CM

## 2015-11-11 LAB — BAYER DCA HB A1C WAIVED: HB A1C (BAYER DCA - WAIVED): 6.6 % (ref <7.0)

## 2015-11-11 MED ORDER — GLIPIZIDE-METFORMIN HCL 2.5-500 MG PO TABS: ORAL_TABLET | ORAL | Status: DC

## 2015-11-11 NOTE — Patient Instructions (Signed)
Tdap Vaccine (Tetanus, Diphtheria and Pertussis): What You Need to Know 1. Why get vaccinated? Tetanus, diphtheria and pertussis are very serious diseases. Tdap vaccine can protect us from these diseases. And, Tdap vaccine given to pregnant women can protect newborn babies against pertussis. TETANUS (Lockjaw) is rare in the [place] today. It causes painful muscle tightening and stiffness, usually all over the body.  It can lead to tightening of muscles in the head and neck so you can't open your mouth, swallow, or sometimes even breathe. Tetanus kills about 1 out of 10 people who are infected even after receiving the best medical care. DIPHTHERIA is also rare in the [place] today. It can cause a thick coating to form in the back of the throat.  It can lead to breathing problems, heart failure, paralysis, and death. PERTUSSIS (Whooping Cough) causes severe coughing spells, which can cause difficulty breathing, vomiting and disturbed sleep.  It can also lead to weight loss, incontinence, and rib fractures. Up to 2 in 100 adolescents and 5 in 100 adults with pertussis are hospitalized or have complications, which could include pneumonia or death. These diseases are caused by bacteria. Diphtheria and pertussis are spread from person to person through secretions from coughing or sneezing. Tetanus enters the body through cuts, scratches, or wounds. Before vaccines, as many as 200,000 cases of diphtheria, 200,000 cases of pertussis, and hundreds of cases of tetanus, were reported in the [place] each year. Since vaccination began, reports of cases for tetanus and diphtheria have dropped by about 99% and for pertussis by about 80%. 2. Tdap vaccine Tdap vaccine can protect adolescents and adults from tetanus, diphtheria, and pertussis. One dose of Tdap is routinely given at age 11 or 12. People who did not get Tdap at that age should get it as soon as possible. Tdap is especially important  for healthcare professionals and anyone having close contact with a baby younger than 12 months. Pregnant women should get a dose of Tdap during every pregnancy, to protect the newborn from pertussis. Infants are most at risk for severe, life-threatening complications from pertussis. Another vaccine, called Td, protects against tetanus and diphtheria, but not pertussis. A Td booster should be given every 10 years. Tdap may be given as one of these boosters if you have never gotten Tdap before. Tdap may also be given after a severe cut or burn to prevent tetanus infection. Your doctor or the person giving you the vaccine can give you more information. Tdap may safely be given at the same time as other vaccines. 3. Some people should not get this vaccine  A person who has ever had a life-threatening allergic reaction after a previous dose of any diphtheria, tetanus or pertussis containing vaccine, OR has a severe allergy to any part of this vaccine, should not get Tdap vaccine. Tell the person giving the vaccine about any severe allergies.  Anyone who had coma or long repeated seizures within 7 days after a childhood dose of DTP or DTaP, or a previous dose of Tdap, should not get Tdap, unless a cause other than the vaccine was found. They can still get Td.  Talk to your doctor if you:  have seizures or another nervous system problem,  had severe pain or swelling after any vaccine containing diphtheria, tetanus or pertussis,  ever had a condition called Guillain-Barr Syndrome (GBS),  aren't feeling well on the day the shot is scheduled. 4. Risks With any medicine, including vaccines, there is   a chance of side effects. These are usually mild and go away on their own. Serious reactions are also possible but are rare. Most people who get Tdap vaccine do not have any problems with it. Mild problems following Tdap (Did not interfere with activities)  Pain where the shot was given (about 3 in 4  adolescents or 2 in 3 adults)  Redness or swelling where the shot was given (about 1 person in 5)  Mild fever of at least 100.4F (up to about 1 in 25 adolescents or 1 in 100 adults)  Headache (about 3 or 4 people in 10)  Tiredness (about 1 person in 3 or 4)  Nausea, vomiting, diarrhea, stomach ache (up to 1 in 4 adolescents or 1 in 10 adults)  Chills, sore joints (about 1 person in 10)  Body aches (about 1 person in 3 or 4)  Rash, swollen glands (uncommon) Moderate problems following Tdap (Interfered with activities, but did not require medical attention)  Pain where the shot was given (up to 1 in 5 or 6)  Redness or swelling where the shot was given (up to about 1 in 16 adolescents or 1 in 12 adults)  Fever over 102F (about 1 in 100 adolescents or 1 in 250 adults)  Headache (about 1 in 7 adolescents or 1 in 10 adults)  Nausea, vomiting, diarrhea, stomach ache (up to 1 or 3 people in 100)  Swelling of the entire arm where the shot was given (up to about 1 in 500). Severe problems following Tdap (Unable to perform usual activities; required medical attention)  Swelling, severe pain, bleeding and redness in the arm where the shot was given (rare). Problems that could happen after any vaccine:  People sometimes faint after a medical procedure, including vaccination. Sitting or lying down for about 15 minutes can help prevent fainting, and injuries caused by a fall. Tell your doctor if you feel dizzy, or have vision changes or ringing in the ears.  Some people get severe pain in the shoulder and have difficulty moving the arm where a shot was given. This happens very rarely.  Any medication can cause a severe allergic reaction. Such reactions from a vaccine are very rare, estimated at fewer than 1 in a million doses, and would happen within a few minutes to a few hours after the vaccination. As with any medicine, there is a very remote chance of a vaccine causing a serious  injury or death. The safety of vaccines is always being monitored. For more information, visit: www.cdc.gov/vaccinesafety/ 5. What if there is a serious problem? What should I look for?  Look for anything that concerns you, such as signs of a severe allergic reaction, very high fever, or unusual behavior.  Signs of a severe allergic reaction can include hives, swelling of the face and throat, difficulty breathing, a fast heartbeat, dizziness, and weakness. These would usually start a few minutes to a few hours after the vaccination. What should I do?  If you think it is a severe allergic reaction or other emergency that can't wait, call 9-1-1 or get the person to the nearest hospital. Otherwise, call your doctor.  Afterward, the reaction should be reported to the Vaccine Adverse Event Reporting System (VAERS). Your doctor might file this report, or you can do it yourself through the VAERS web site at www.vaers.hhs.gov, or by calling 1-800-822-7967. VAERS does not give medical advice.  6. The National Vaccine Injury Compensation Program The National Vaccine Injury Compensation Program (  VICP) is a federal program that was created to compensate people who may have been injured by certain vaccines. Persons who believe they may have been injured by a vaccine can learn about the program and about filing a claim by calling 1-800-338-2382 or visiting the VICP website at www.hrsa.gov/vaccinecompensation. There is a time limit to file a claim for compensation. 7. How can I learn more?  Ask your doctor. He or she can give you the vaccine package insert or suggest other sources of information.  Call your local or state health department.  Contact the Centers for Disease Control and Prevention (CDC):  Call 1-800-232-4636 (1-800-CDC-INFO) or  Visit CDC's website at www.cdc.gov/vaccines CDC Tdap Vaccine VIS (06/26/13)   This information is not intended to replace advice given to you by your health care  provider. Make sure you discuss any questions you have with your health care provider.   Document Released: 10/19/2011 Document Revised: 05/10/2014 Document Reviewed: 08/01/2013 Elsevier Interactive Patient Education 2016 [employer] Inc.  

## 2015-11-11 NOTE — Assessment & Plan Note (Signed)
Under good control. A1c 6.6. Continue diet and exercise. Continue medication. Refill given today. Recheck 3 months at physical with PCP.

## 2015-11-11 NOTE — Progress Notes (Signed)
BP 134/71 mmHg  Pulse 50  Temp(Src) 98 F (36.7 C)  Wt 200 lb (90.719 kg)  SpO2 98%   Subjective:    Patient ID: Adam Frost, male    DOB: 02/04/1941, 75 y.o.   MRN: [id number]  HPI: Adam Frost is a 75 y.o. male  Chief Complaint  Patient presents with  . Diabetes    Patient would like a 90 day supply of the glipizide-metformin  . Hypertension   DIABETES Hypoglycemic episodes:no Polydipsia/polyuria: no Visual disturbance: no Chest pain: no Paresthesias: no Glucose Monitoring: no  Accucheck frequency: Not Checking Taking Insulin?: no Blood Pressure Monitoring: not checking Retinal Examination: Up to Date Foot Exam: Up to Date Diabetic Education: Completed Pneumovax: Up to Date Influenza: Up to Date Aspirin: yes  Relevant past medical, surgical, family and social history reviewed and updated as indicated. Interim medical history since our last visit reviewed. Allergies and medications reviewed and updated.  Review of Systems  Constitutional: Negative.   Respiratory: Negative.   Cardiovascular: Negative.   Psychiatric/Behavioral: Negative.     Per HPI unless specifically indicated above     Objective:    BP 134/71 mmHg  Pulse 50  Temp(Src) 98 F (36.7 C)  Wt 200 lb (90.719 kg)  SpO2 98%  Wt Readings from Last 3 Encounters:  11/11/15 200 lb (90.719 kg)  08/11/15 198 lb (89.812 kg)  04/24/15 200 lb (90.719 kg)    Physical Exam  Constitutional: He is oriented to person, place, and time. He appears well-developed and well-nourished. No distress.  HENT:  Head: Normocephalic and atraumatic.  Right Ear: Hearing normal.  Left Ear: Hearing normal.  Nose: Nose normal.  Eyes: Conjunctivae and lids are normal. Right eye exhibits no discharge. Left eye exhibits no discharge. No scleral icterus.  Cardiovascular: Normal rate, regular rhythm and intact distal pulses.  Exam reveals no gallop and no friction rub.   No murmur heard. Pulmonary/Chest: Effort normal  and breath sounds normal. No respiratory distress. He has no wheezes. He has no rales. He exhibits no tenderness.  Musculoskeletal: Normal range of motion.  Neurological: He is alert and oriented to person, place, and time.  Skin: Skin is warm, dry and intact. No rash noted. He is not diaphoretic. No erythema. No pallor.  Psychiatric: He has a normal mood and affect. His speech is normal and behavior is normal. Judgment and thought content normal. Cognition and memory are normal.  Nursing note and vitals reviewed.  Diabetic Foot Exam - Simple   Simple Foot Form  Diabetic Foot exam was performed with the following findings:  Yes 11/11/2015  8:50 AM  Visual Inspection  No deformities, no ulcerations, no other skin breakdown bilaterally:  Yes  Sensation Testing  Intact to touch and monofilament testing bilaterally:  Yes  Pulse Check  Posterior Tibialis and Dorsalis pulse intact bilaterally:  Yes  Comments      Results for orders placed or performed in visit on 09/01/15  HM DIABETES EYE EXAM  Result Value Ref Range   HM Diabetic Eye Exam No Retinopathy No Retinopathy      Assessment & Plan:   Problem List Items Addressed This Visit      Endocrine   Diabetes mellitus without complication ([place]) - Primary    Under good control. A1c 6.6. Continue diet and exercise. Continue medication. Refill given today. Recheck 3 months at physical with PCP.       Relevant Medications   glipiZIDE-metformin (METAGLIP) 2.5-500 MG tablet  Other Visit Diagnoses    Need for diphtheria-tetanus-pertussis (Tdap) vaccine        Given today.     Relevant Orders    Tdap vaccine greater than or equal to 7yo IM        Follow up plan: Return in about 3 months (around 02/11/2016) for Physical with [facility].

## 2016-01-20 ENCOUNTER — Encounter (INDEPENDENT_AMBULATORY_CARE_PROVIDER_SITE_OTHER): Payer: Self-pay

## 2016-02-12 ENCOUNTER — Other Ambulatory Visit: Payer: Self-pay | Admitting: Family Medicine

## 2016-02-12 DIAGNOSIS — E119 Type 2 diabetes mellitus without complications: Secondary | ICD-10-CM

## 2016-02-17 ENCOUNTER — Encounter: Payer: Self-pay | Admitting: Family Medicine

## 2016-02-17 ENCOUNTER — Ambulatory Visit (INDEPENDENT_AMBULATORY_CARE_PROVIDER_SITE_OTHER): Payer: Medicare Other | Admitting: Family Medicine

## 2016-02-17 VITALS — BP 136/60 | HR 56 | Temp 98.1°F | Ht 70.5 in | Wt 200.0 lb

## 2016-02-17 DIAGNOSIS — E7800 Pure hypercholesterolemia, unspecified: Secondary | ICD-10-CM | POA: Diagnosis not present

## 2016-02-17 DIAGNOSIS — I10 Essential (primary) hypertension: Secondary | ICD-10-CM

## 2016-02-17 DIAGNOSIS — E782 Mixed hyperlipidemia: Secondary | ICD-10-CM

## 2016-02-17 DIAGNOSIS — E119 Type 2 diabetes mellitus without complications: Secondary | ICD-10-CM | POA: Diagnosis not present

## 2016-02-17 DIAGNOSIS — Z0000 Encounter for general adult medical examination without abnormal findings: Secondary | ICD-10-CM

## 2016-02-17 DIAGNOSIS — N400 Enlarged prostate without lower urinary tract symptoms: Secondary | ICD-10-CM | POA: Diagnosis not present

## 2016-02-17 LAB — BAYER DCA HB A1C WAIVED: HB A1C (BAYER DCA - WAIVED): 6.9 % (ref <7.0)

## 2016-02-17 MED ORDER — AMLODIPINE BESYLATE 5 MG PO TABS: 5.0000 mg | ORAL_TABLET | Freq: Every day | ORAL | 4 refills | Status: DC

## 2016-02-17 MED ORDER — QUINAPRIL HCL 40 MG PO TABS: 40.0000 mg | ORAL_TABLET | Freq: Every day | ORAL | 4 refills | Status: DC

## 2016-02-17 MED ORDER — SIMVASTATIN 40 MG PO TABS: 40.0000 mg | ORAL_TABLET | Freq: Every day | ORAL | 0 refills | Status: DC

## 2016-02-17 MED ORDER — EZETIMIBE 10 MG PO TABS: 10.0000 mg | ORAL_TABLET | Freq: Every day | ORAL | 0 refills | Status: DC

## 2016-02-17 MED ORDER — SITAGLIPTIN PHOSPHATE 100 MG PO TABS
100.0000 mg | ORAL_TABLET | Freq: Every day | ORAL | 1 refills | Status: DC
Start: 2016-02-17 — End: 2016-08-12

## 2016-02-17 MED ORDER — EZETIMIBE-SIMVASTATIN 10-40 MG PO TABS: 1.0000 | ORAL_TABLET | Freq: Every day | ORAL | 4 refills | Status: DC

## 2016-02-17 MED ORDER — GLIPIZIDE-METFORMIN HCL 2.5-500 MG PO TABS: ORAL_TABLET | ORAL | 4 refills | Status: DC

## 2016-02-17 NOTE — Progress Notes (Signed)
BP 136/60 (BP Location: Left Arm)   Pulse (!) 56   Temp 98.1 F (36.7 C)   Ht 5' 10.5" (1.791 m)   Wt 200 lb (90.7 kg)   SpO2 99%   BMI 28.29 kg/m    Subjective:    Patient ID: Adam Frost, male    DOB: 07-Oct-1940, 75 y.o.   MRN: [id number]  HPI: Adam Frost is a 75 y.o. male  Chief Complaint  Patient presents with  . Annual Exam  AWV metrics met Patient follow-up had episode of bursitis, arthritis right hip area resolved with meloxicam and time was felt to be most likely secondary to position of his foot on his lawn more. By movement and medicines has gotten better. Blood pressures been doing well initial blood pressure reading here was elevated but otherwise been doing good did have at the beach in August episodes of swollen feet but that is resolved no further issues with swelling. Diabetes no issues no low blood sugar spells or problems with medications. Cholesterol no issues with medications other than cost of Zetia. Wondering if he still needs it.  Relevant past medical, surgical, family and social history reviewed and updated as indicated. Interim medical history since our last visit reviewed. Allergies and medications reviewed and updated.  Review of Systems  Constitutional: Negative.   HENT: Negative.   Eyes: Negative.   Respiratory: Negative.   Cardiovascular: Negative.   Gastrointestinal: Negative.   Endocrine: Negative.   Genitourinary: Negative.   Musculoskeletal: Negative.   Skin: Negative.   Allergic/Immunologic: Negative.   Neurological: Negative.   Hematological: Negative.   Psychiatric/Behavioral: Negative.     Per HPI unless specifically indicated above     Objective:    BP 136/60 (BP Location: Left Arm)   Pulse (!) 56   Temp 98.1 F (36.7 C)   Ht 5' 10.5" (1.791 m)   Wt 200 lb (90.7 kg)   SpO2 99%   BMI 28.29 kg/m   Wt Readings from Last 3 Encounters:  02/17/16 200 lb (90.7 kg)  11/11/15 200 lb (90.7 kg)  08/11/15 198 lb (89.8  kg)    Physical Exam  Constitutional: He is oriented to person, place, and time. He appears well-developed and well-nourished.  HENT:  Head: Normocephalic and atraumatic.  Right Ear: External ear normal.  Left Ear: External ear normal.  Eyes: Conjunctivae and EOM are normal. Pupils are equal, round, and reactive to light.  Neck: Normal range of motion. Neck supple.  Cardiovascular: Normal rate, regular rhythm, normal heart sounds and intact distal pulses.   Pulmonary/Chest: Effort normal and breath sounds normal.  Abdominal: Soft. Bowel sounds are normal. There is no splenomegaly or hepatomegaly.  Genitourinary: Rectum normal and penis normal.  Genitourinary Comments: Prostate enlarged  Musculoskeletal: Normal range of motion.  Neurological: He is alert and oriented to person, place, and time. He has normal reflexes.  Skin: No rash noted. No erythema.  Psychiatric: He has a normal mood and affect. His behavior is normal. Judgment and thought content normal.    Results for orders placed or performed in visit on 11/11/15  Bayer DCA Hb A1c Waived  Result Value Ref Range   Bayer DCA Hb A1c Waived 6.6 <7.0 %      Assessment & Plan:   Problem List Items Addressed This Visit      Cardiovascular and Mediastinum   Essential hypertension    The current medical regimen is effective;  continue present plan and medications.  Relevant Medications   quinapril (ACCUPRIL) 40 MG tablet   amLODipine (NORVASC) 5 MG tablet   ezetimibe-simvastatin (VYTORIN) 10-40 MG tablet   ezetimibe (ZETIA) 10 MG tablet   simvastatin (ZOCOR) 40 MG tablet   Other Relevant Orders   Comprehensive metabolic panel   CBC with Differential/Platelet   TSH     Endocrine   Diabetes mellitus without complication ([place]) - Primary    Discussed diabetes Actos may be causing some fluid retention issues also will stop Actos and start Januvia 1 a day will also be sensitive to patient's pharmaceutical prescription  list if needed.       Relevant Medications   quinapril (ACCUPRIL) 40 MG tablet   glipiZIDE-metformin (METAGLIP) 2.5-500 MG tablet   simvastatin (ZOCOR) 40 MG tablet   sitaGLIPtin (JANUVIA) 100 MG tablet   Other Relevant Orders   Bayer DCA Hb A1c Waived   CBC with Differential/Platelet   TSH     Genitourinary   BPH (benign prostatic hyperplasia)   Relevant Orders   PSA     Other   Hyperlipidemia    For cholesterol discussed trial of checking off Zetia to see if needed. We'll put an order for future lipid panel      Relevant Medications   quinapril (ACCUPRIL) 40 MG tablet   amLODipine (NORVASC) 5 MG tablet   ezetimibe-simvastatin (VYTORIN) 10-40 MG tablet   ezetimibe (ZETIA) 10 MG tablet   simvastatin (ZOCOR) 40 MG tablet   Other Relevant Orders   Comprehensive metabolic panel   Lipid panel   CBC with Differential/Platelet   TSH    Other Visit Diagnoses    PE (physical exam), annual       Relevant Orders   Comprehensive metabolic panel   Lipid panel   CBC with Differential/Platelet   TSH       Follow up plan: Return in about 3 months (around 05/19/2016) for Hemoglobin A1c,   Lipids, ALT, AST.

## 2016-02-17 NOTE — Assessment & Plan Note (Signed)
The current medical regimen is effective;  continue present plan and medications.  

## 2016-02-17 NOTE — Assessment & Plan Note (Signed)
Discussed diabetes Actos may be causing some fluid retention issues also will stop Actos and start Januvia 1 a day will also be sensitive to patient's pharmaceutical prescription list if needed.

## 2016-02-17 NOTE — Assessment & Plan Note (Signed)
For cholesterol discussed trial of checking off Zetia to see if needed. We'll put an order for future lipid panel

## 2016-02-18 ENCOUNTER — Encounter: Payer: Self-pay | Admitting: Family Medicine

## 2016-02-18 LAB — CBC WITH DIFFERENTIAL/PLATELET
BASOS ABS: 0 10*3/uL (ref 0.0–0.2)
Basos: 1 %
EOS (ABSOLUTE): 0 10*3/uL (ref 0.0–0.4)
Eos: 1 %
Hematocrit: 45 % (ref 37.5–51.0)
Hemoglobin: 15.3 g/dL (ref 12.6–17.7)
IMMATURE GRANS (ABS): 0 10*3/uL (ref 0.0–0.1)
IMMATURE GRANULOCYTES: 1 %
LYMPHS: 19 %
Lymphocytes Absolute: 0.7 10*3/uL (ref 0.7–3.1)
MCH: 31.2 pg (ref 26.6–33.0)
MCHC: 34 g/dL (ref 31.5–35.7)
MCV: 92 fL (ref 79–97)
Monocytes Absolute: 0.6 10*3/uL (ref 0.1–0.9)
Monocytes: 15 %
NEUTROS PCT: 63 %
Neutrophils Absolute: 2.4 10*3/uL (ref 1.4–7.0)
PLATELETS: 140 10*3/uL — AB (ref 150–379)
RBC: 4.9 x10E6/uL (ref 4.14–5.80)
RDW: 14.9 % (ref 12.3–15.4)
WBC: 3.8 10*3/uL (ref 3.4–10.8)

## 2016-02-18 LAB — LIPID PANEL
CHOL/HDL RATIO: 3.2 ratio (ref 0.0–5.0)
Cholesterol, Total: 156 mg/dL (ref 100–199)
HDL: 49 mg/dL (ref >39)
LDL CALC: 91 mg/dL (ref 0–99)
Triglycerides: 81 mg/dL (ref 0–149)
VLDL Cholesterol Cal: 16 mg/dL (ref 5–40)

## 2016-02-18 LAB — PSA: PROSTATE SPECIFIC AG, SERUM: 0.4 ng/mL (ref 0.0–4.0)

## 2016-02-18 LAB — COMPREHENSIVE METABOLIC PANEL
A/G RATIO: 1.6 (ref 1.2–2.2)
ALT: 13 IU/L (ref 0–44)
AST: 16 IU/L (ref 0–40)
Albumin: 4.2 g/dL (ref 3.5–4.8)
Alkaline Phosphatase: 79 IU/L (ref 39–117)
BILIRUBIN TOTAL: 0.9 mg/dL (ref 0.0–1.2)
BUN/Creatinine Ratio: 17 (ref 10–24)
BUN: 15 mg/dL (ref 8–27)
CALCIUM: 9.2 mg/dL (ref 8.6–10.2)
CHLORIDE: 99 mmol/L (ref 96–106)
CO2: 25 mmol/L (ref 18–29)
Creatinine, Ser: 0.88 mg/dL (ref 0.76–1.27)
GFR calc Af Amer: 96 mL/min/{1.73_m2} (ref >59)
GFR calc non Af Amer: 83 mL/min/{1.73_m2} (ref >59)
GLUCOSE: 123 mg/dL — AB (ref 65–99)
Globulin, Total: 2.6 g/dL (ref 1.5–4.5)
POTASSIUM: 3.7 mmol/L (ref 3.5–5.2)
Sodium: 140 mmol/L (ref 134–144)
Total Protein: 6.8 g/dL (ref 6.0–8.5)

## 2016-02-18 LAB — TSH: TSH: 2.67 u[IU]/mL (ref 0.450–4.500)

## 2016-03-01 ENCOUNTER — Other Ambulatory Visit: Payer: Self-pay | Admitting: Family Medicine

## 2016-03-01 DIAGNOSIS — E785 Hyperlipidemia, unspecified: Secondary | ICD-10-CM

## 2016-03-01 DIAGNOSIS — E7800 Pure hypercholesterolemia, unspecified: Secondary | ICD-10-CM

## 2016-03-01 NOTE — Telephone Encounter (Signed)
Medication was on hold.

## 2016-03-01 NOTE — Telephone Encounter (Signed)
He was given a years supply on 10/17. If it's an issue with breaking it apart, that's fine, but he can still have 90 with 4 refills. Otherwise, the Rx should already be at the pharmacy.

## 2016-03-16 ENCOUNTER — Other Ambulatory Visit: Payer: Self-pay | Admitting: Family Medicine

## 2016-03-16 DIAGNOSIS — E7800 Pure hypercholesterolemia, unspecified: Secondary | ICD-10-CM

## 2016-03-16 NOTE — Telephone Encounter (Signed)
Routing to provider  

## 2016-03-23 ENCOUNTER — Encounter: Payer: Self-pay | Admitting: Family Medicine

## 2016-04-17 ENCOUNTER — Other Ambulatory Visit: Payer: Self-pay | Admitting: Family Medicine

## 2016-04-17 DIAGNOSIS — I10 Essential (primary) hypertension: Secondary | ICD-10-CM

## 2016-05-24 ENCOUNTER — Ambulatory Visit: Payer: Medicare Other | Admitting: Family Medicine

## 2016-05-31 ENCOUNTER — Encounter: Payer: Self-pay | Admitting: Family Medicine

## 2016-05-31 ENCOUNTER — Ambulatory Visit (INDEPENDENT_AMBULATORY_CARE_PROVIDER_SITE_OTHER): Payer: Medicare Other | Admitting: Family Medicine

## 2016-05-31 VITALS — BP 139/62 | HR 64 | Temp 98.3°F | Ht 70.0 in | Wt 201.0 lb

## 2016-05-31 DIAGNOSIS — E782 Mixed hyperlipidemia: Secondary | ICD-10-CM | POA: Diagnosis not present

## 2016-05-31 DIAGNOSIS — I10 Essential (primary) hypertension: Secondary | ICD-10-CM

## 2016-05-31 DIAGNOSIS — E119 Type 2 diabetes mellitus without complications: Secondary | ICD-10-CM

## 2016-05-31 LAB — BAYER DCA HB A1C WAIVED: HB A1C (BAYER DCA - WAIVED): 6.8 % (ref <7.0)

## 2016-05-31 LAB — LP+ALT+AST PICCOLO, WAIVED
ALT (SGPT) Piccolo, Waived: 14 U/L (ref 10–47)
AST (SGOT) Piccolo, Waived: 22 U/L (ref 11–38)
CHOL/HDL RATIO PICCOLO,WAIVE: 3.5 mg/dL
Cholesterol Piccolo, Waived: 165 mg/dL (ref <200)
HDL Chol Piccolo, Waived: 47 mg/dL — ABNORMAL LOW (ref >59)
LDL Chol Calc Piccolo Waived: 102 mg/dL — ABNORMAL HIGH (ref <100)
TRIGLYCERIDES PICCOLO,WAIVED: 82 mg/dL (ref <150)
VLDL CHOL CALC PICCOLO,WAIVE: 16 mg/dL (ref <30)

## 2016-05-31 NOTE — Assessment & Plan Note (Signed)
A1c is 6.8 we'll go ahead and reduce Januvia to 50 mg and observe rash and blood sugars

## 2016-05-31 NOTE — Progress Notes (Signed)
BP 139/62 (BP Location: Left Arm)   Pulse 64   Temp 98.3 F (36.8 C) (Oral)   Ht 5\' 10"  (1.778 m)   Wt 201 lb (91.2 kg)   BMI 28.84 kg/m    Subjective:    Patient ID: Adam Frost, male    DOB: 11/18/1940, 76 y.o.   MRN: [id number]  HPI: Adam Frost is a 76 y.o. male  Chief Complaint  Patient presents with  . Follow-up  . Hypertension  . Diabetes  . Hyperlipidemia  Patient follow-up doing well switched to Januvia and may be exacerbated some of the rash on his back. This was brought up by his dermatologist. And possibly reducing medicine may help. The rash was present prior to starting Januvia.  Blood pressure doing well no complaints Diabetes doing well no complaints noted low blood sugar spells Has been trying to stop Zetia and stopped 2 weeks ago to assess lipid panel off medications.  Relevant past medical, surgical, family and social history reviewed and updated as indicated. Interim medical history since our last visit reviewed. Allergies and medications reviewed and updated.  Review of Systems  Constitutional: Negative.   Respiratory: Negative.   Cardiovascular: Negative.     Per HPI unless specifically indicated above     Objective:    BP 139/62 (BP Location: Left Arm)   Pulse 64   Temp 98.3 F (36.8 C) (Oral)   Ht 5\' 10"  (1.778 m)   Wt 201 lb (91.2 kg)   BMI 28.84 kg/m   Wt Readings from Last 3 Encounters:  05/31/16 201 lb (91.2 kg)  02/17/16 200 lb (90.7 kg)  11/11/15 200 lb (90.7 kg)    Physical Exam  Constitutional: He is oriented to person, place, and time. He appears well-developed and well-nourished. No distress.  HENT:  Head: Normocephalic and atraumatic.  Right Ear: Hearing normal.  Left Ear: Hearing normal.  Nose: Nose normal.  Eyes: Conjunctivae and lids are normal. Right eye exhibits no discharge. Left eye exhibits no discharge. No scleral icterus.  Cardiovascular: Normal rate, regular rhythm and normal heart sounds.     Pulmonary/Chest: Effort normal and breath sounds normal. No respiratory distress.  Musculoskeletal: Normal range of motion.  Neurological: He is alert and oriented to person, place, and time.  Skin: Skin is intact. No rash noted.  Psychiatric: He has a normal mood and affect. His speech is normal and behavior is normal. Judgment and thought content normal. Cognition and memory are normal.    Results for orders placed or performed in visit on 02/17/16  Bayer DCA Hb A1c Waived  Result Value Ref Range   Bayer DCA Hb A1c Waived 6.9 <7.0 %  Comprehensive metabolic panel  Result Value Ref Range   Glucose 123 (H) 65 - 99 mg/dL   BUN 15 8 - 27 mg/dL   Creatinine, Ser 0.88 0.76 - 1.27 mg/dL   GFR calc non Af Amer 83 >59 mL/min/1.73   GFR calc Af Amer 96 >59 mL/min/1.73   BUN/Creatinine Ratio 17 10 - 24   Sodium 140 134 - 144 mmol/L   Potassium 3.7 3.5 - 5.2 mmol/L   Chloride 99 96 - 106 mmol/L   CO2 25 18 - 29 mmol/L   Calcium 9.2 8.6 - 10.2 mg/dL   Total Protein 6.8 6.0 - 8.5 g/dL   Albumin 4.2 3.5 - 4.8 g/dL   Globulin, Total 2.6 1.5 - 4.5 g/dL   Albumin/Globulin Ratio 1.6 1.2 - 2.2  Bilirubin Total 0.9 0.0 - 1.2 mg/dL   Alkaline Phosphatase 79 39 - 117 IU/L   AST 16 0 - 40 IU/L   ALT 13 0 - 44 IU/L  Lipid panel  Result Value Ref Range   Cholesterol, Total 156 100 - 199 mg/dL   Triglycerides 81 0 - 149 mg/dL   HDL 49 >39 mg/dL   VLDL Cholesterol Cal 16 5 - 40 mg/dL   LDL Calculated 91 0 - 99 mg/dL   Chol/HDL Ratio 3.2 0.0 - 5.0 ratio units  CBC with Differential/Platelet  Result Value Ref Range   WBC 3.8 3.4 - 10.8 x10E3/uL   RBC 4.90 4.14 - 5.80 x10E6/uL   Hemoglobin 15.3 12.6 - 17.7 g/dL   Hematocrit 45.0 37.5 - 51.0 %   MCV 92 79 - 97 fL   MCH 31.2 26.6 - 33.0 pg   MCHC 34.0 31.5 - 35.7 g/dL   RDW 14.9 12.3 - 15.4 %   Platelets 140 (L) 150 - 379 x10E3/uL   Neutrophils 63 Not Estab. %   Lymphs 19 Not Estab. %   Monocytes 15 Not Estab. %   Eos 1 Not Estab. %    Basos 1 Not Estab. %   Neutrophils Absolute 2.4 1.4 - 7.0 x10E3/uL   Lymphocytes Absolute 0.7 0.7 - 3.1 x10E3/uL   Monocytes Absolute 0.6 0.1 - 0.9 x10E3/uL   EOS (ABSOLUTE) 0.0 0.0 - 0.4 x10E3/uL   Basophils Absolute 0.0 0.0 - 0.2 x10E3/uL   Immature Granulocytes 1 Not Estab. %   Immature Grans (Abs) 0.0 0.0 - 0.1 x10E3/uL  TSH  Result Value Ref Range   TSH 2.670 0.450 - 4.500 uIU/mL  PSA  Result Value Ref Range   Prostate Specific Ag, Serum 0.4 0.0 - 4.0 ng/mL      Assessment & Plan:   Problem List Items Addressed This Visit      Cardiovascular and Mediastinum   Essential hypertension - Primary    The current medical regimen is effective;  continue present plan and medications.       Relevant Orders   LP+ALT+AST Piccolo, Waived (STAT)     Endocrine   Diabetes mellitus without complication ([place])    123456 is 6.8 we'll go ahead and reduce Januvia to 50 mg and observe rash and blood sugars      Relevant Orders   Bayer DCA Hb A1c Waived (STAT)   LP+ALT+AST Piccolo, Waived (STAT)     Other   Hyperlipidemia    Patient off Zetia lipids doing well on a 10 point gain in LDL will leave alone and stay off Zetia.      Relevant Orders   LP+ALT+AST Piccolo, Waived (STAT)       Follow up plan: Return in about 3 months (around 08/29/2016) for  Lipids, ALT, AST, Hemoglobin A1c.

## 2016-05-31 NOTE — Assessment & Plan Note (Signed)
Patient off Zetia lipids doing well on a 10 point gain in LDL will leave alone and stay off Zetia.

## 2016-05-31 NOTE — Assessment & Plan Note (Signed)
The current medical regimen is effective;  continue present plan and medications.  

## 2016-08-12 ENCOUNTER — Other Ambulatory Visit: Payer: Self-pay | Admitting: Family Medicine

## 2016-08-30 ENCOUNTER — Ambulatory Visit (INDEPENDENT_AMBULATORY_CARE_PROVIDER_SITE_OTHER): Payer: Medicare Other | Admitting: Family Medicine

## 2016-08-30 ENCOUNTER — Encounter: Payer: Self-pay | Admitting: Family Medicine

## 2016-08-30 VITALS — BP 137/61 | HR 59 | Temp 97.5°F | Ht 69.0 in | Wt 192.6 lb

## 2016-08-30 DIAGNOSIS — E119 Type 2 diabetes mellitus without complications: Secondary | ICD-10-CM

## 2016-08-30 DIAGNOSIS — E782 Mixed hyperlipidemia: Secondary | ICD-10-CM

## 2016-08-30 DIAGNOSIS — I10 Essential (primary) hypertension: Secondary | ICD-10-CM | POA: Diagnosis not present

## 2016-08-30 LAB — BAYER DCA HB A1C WAIVED: HB A1C (BAYER DCA - WAIVED): 7 % — ABNORMAL HIGH (ref <7.0)

## 2016-08-30 LAB — LP+ALT+AST PICCOLO, WAIVED
ALT (SGPT) Piccolo, Waived: 12 U/L (ref 10–47)
AST (SGOT) Piccolo, Waived: 26 U/L (ref 11–38)
CHOLESTEROL PICCOLO, WAIVED: 149 mg/dL (ref <200)
Chol/HDL Ratio Piccolo,Waive: 3.4 mg/dL
HDL CHOL PICCOLO, WAIVED: 44 mg/dL — AB (ref >59)
LDL CHOL CALC PICCOLO WAIVED: 89 mg/dL (ref <100)
Triglycerides Piccolo,Waived: 79 mg/dL (ref <150)
VLDL Chol Calc Piccolo,Waive: 16 mg/dL (ref <30)

## 2016-08-30 NOTE — Assessment & Plan Note (Signed)
The current medical regimen is effective;  continue present plan and medications.  

## 2016-08-30 NOTE — Progress Notes (Signed)
BP 137/61 (BP Location: Left Arm, Patient Position: Sitting, Cuff Size: Normal)   Pulse (!) 59   Temp 97.5 F (36.4 C)   Ht 5\' 9"  (1.753 m)   Wt 192 lb 9.6 oz (87.4 kg)   SpO2 97%   BMI 28.44 kg/m    Subjective:    Patient ID: Adam Frost, male    DOB: 10-24-1940, 76 y.o.   MRN: [id number]  HPI: Adam Frost is a 76 y.o. male  Chief Complaint  Patient presents with  . Diabetes  . Hyperlipidemia  Patient all in all doing well taking Januvia 100 mg half a tablet a day as felt it was causing his back to break out by dermatology. Rash is better and diabetes remains okay A1c is only gone up 2/10 of a point. Blood pressure good control no issues with medications. Cholesterol doing well no complaints takes medication faithfully.  Relevant past medical, surgical, family and social history reviewed and updated as indicated. Interim medical history since our last visit reviewed. Allergies and medications reviewed and updated.  Review of Systems  Constitutional: Negative.   Respiratory: Negative.   Cardiovascular: Negative.     Per HPI unless specifically indicated above     Objective:    BP 137/61 (BP Location: Left Arm, Patient Position: Sitting, Cuff Size: Normal)   Pulse (!) 59   Temp 97.5 F (36.4 C)   Ht 5\' 9"  (1.753 m)   Wt 192 lb 9.6 oz (87.4 kg)   SpO2 97%   BMI 28.44 kg/m   Wt Readings from Last 3 Encounters:  08/30/16 192 lb 9.6 oz (87.4 kg)  05/31/16 201 lb (91.2 kg)  02/17/16 200 lb (90.7 kg)    Physical Exam  Constitutional: He is oriented to person, place, and time. He appears well-developed and well-nourished.  HENT:  Head: Normocephalic and atraumatic.  Eyes: Conjunctivae and EOM are normal.  Neck: Normal range of motion.  Cardiovascular: Normal rate, regular rhythm and normal heart sounds.   Pulmonary/Chest: Effort normal and breath sounds normal.  Musculoskeletal: Normal range of motion.  Neurological: He is alert and oriented to person,  place, and time.  Skin: No erythema.  Psychiatric: He has a normal mood and affect. His behavior is normal. Judgment and thought content normal.    Results for orders placed or performed in visit on 05/31/16  Bayer DCA Hb A1c Waived (STAT)  Result Value Ref Range   Bayer DCA Hb A1c Waived 6.8 <7.0 %  LP+ALT+AST Piccolo, Waived (STAT)  Result Value Ref Range   ALT (SGPT) Piccolo, Waived 14 10 - 47 U/L   AST (SGOT) Piccolo, Waived 22 11 - 38 U/L   Cholesterol Piccolo, Waived 165 <200 mg/dL   HDL Chol Piccolo, Waived 47 (L) >59 mg/dL   Triglycerides Piccolo,Waived 82 <150 mg/dL   Chol/HDL Ratio Piccolo,Waive 3.5 mg/dL   LDL Chol Calc Piccolo Waived 102 (H) <100 mg/dL   VLDL Chol Calc Piccolo,Waive 16 <30 mg/dL      Assessment & Plan:   Problem List Items Addressed This Visit      Cardiovascular and Mediastinum   Essential hypertension - Primary    The current medical regimen is effective;  continue present plan and medications.       Relevant Orders   LP+ALT+AST Piccolo, Waived   Bayer DCA Hb A1c Waived     Endocrine   Diabetes mellitus without complication [facility])    The current medical regimen is effective;  continue present plan and medications.       Relevant Orders   LP+ALT+AST Piccolo, Waived   Bayer DCA Hb A1c Waived     Other   Hyperlipidemia    The current medical regimen is effective;  continue present plan and medications.       Relevant Orders   LP+ALT+AST Piccolo, Waived   Bayer DCA Hb A1c Waived       Follow up plan: Return in about 3 months (around 11/29/2016) for Hemoglobin A1c.

## 2016-10-11 LAB — HM DIABETES EYE EXAM

## 2016-11-09 ENCOUNTER — Encounter: Payer: Self-pay | Admitting: Family Medicine

## 2016-11-30 ENCOUNTER — Ambulatory Visit: Payer: Medicare Other | Admitting: Family Medicine

## 2016-12-06 ENCOUNTER — Ambulatory Visit: Payer: Medicare Other | Admitting: Family Medicine

## 2016-12-11 ENCOUNTER — Other Ambulatory Visit: Payer: Self-pay | Admitting: Family Medicine

## 2016-12-16 ENCOUNTER — Ambulatory Visit (INDEPENDENT_AMBULATORY_CARE_PROVIDER_SITE_OTHER): Payer: Medicare Other | Admitting: Family Medicine

## 2016-12-16 ENCOUNTER — Encounter: Payer: Self-pay | Admitting: Family Medicine

## 2016-12-16 VITALS — BP 127/68 | HR 54 | Temp 97.4°F | Wt 189.2 lb

## 2016-12-16 DIAGNOSIS — I10 Essential (primary) hypertension: Secondary | ICD-10-CM

## 2016-12-16 DIAGNOSIS — E782 Mixed hyperlipidemia: Secondary | ICD-10-CM

## 2016-12-16 DIAGNOSIS — E119 Type 2 diabetes mellitus without complications: Secondary | ICD-10-CM

## 2016-12-16 LAB — BAYER DCA HB A1C WAIVED: HB A1C (BAYER DCA - WAIVED): 6.9 % (ref <7.0)

## 2016-12-16 MED ORDER — SITAGLIPTIN PHOSPHATE 100 MG PO TABS: 50.0000 mg | ORAL_TABLET | Freq: Every day | ORAL | 1 refills | Status: DC

## 2016-12-16 NOTE — Assessment & Plan Note (Signed)
The current medical regimen is effective;  continue present plan and medications.  

## 2016-12-16 NOTE — Progress Notes (Signed)
   BP 127/68   Pulse (!) 54   Temp (!) 97.4 F (36.3 C)   Wt 189 lb 3.2 oz (85.8 kg)   SpO2 98%   BMI 27.94 kg/m    Subjective:    Patient ID: Adam Frost, male    DOB: 1941-04-17, 76 y.o.   MRN: [id number]  HPI: Adam Frost is a 76 y.o. male  Chief Complaint  Patient presents with  . Diabetes  . Hyperlipidemia  . Hypertension   Patient follow-up doing well diabetes no issues concerns taking Januvia a tablet a day with good diabetes control no low blood sugar spells. Having some issues with drugstore on over filling prescription. High blood pressure was strong doing well no complaints from medications taken faithfully.  Relevant past medical, surgical, family and social history reviewed and updated as indicated. Interim medical history since our last visit reviewed. Allergies and medications reviewed and updated.  Review of Systems  Constitutional: Negative.   Respiratory: Negative.   Cardiovascular: Negative.     Per HPI unless specifically indicated above     Objective:    BP 127/68   Pulse (!) 54   Temp (!) 97.4 F (36.3 C)   Wt 189 lb 3.2 oz (85.8 kg)   SpO2 98%   BMI 27.94 kg/m   Wt Readings from Last 3 Encounters:  12/16/16 189 lb 3.2 oz (85.8 kg)  08/30/16 192 lb 9.6 oz (87.4 kg)  05/31/16 201 lb (91.2 kg)    Physical Exam  Constitutional: He is oriented to person, place, and time. He appears well-developed and well-nourished.  HENT:  Head: Normocephalic and atraumatic.  Eyes: Conjunctivae and EOM are normal.  Neck: Normal range of motion.  Cardiovascular: Normal rate, regular rhythm and normal heart sounds.   Pulmonary/Chest: Effort normal and breath sounds normal.  Musculoskeletal: Normal range of motion.  Neurological: He is alert and oriented to person, place, and time.  Skin: No erythema.  Psychiatric: He has a normal mood and affect. His behavior is normal. Judgment and thought content normal.    Results for orders placed or performed  in visit on 12/16/16  Bayer DCA Hb A1c Waived  Result Value Ref Range   Bayer DCA Hb A1c Waived 6.9 <7.0 %      Assessment & Plan:   Problem List Items Addressed This Visit      Cardiovascular and Mediastinum   Essential hypertension - Primary    The current medical regimen is effective;  continue present plan and medications.       Relevant Orders   Bayer DCA Hb A1c Waived (Completed)     Endocrine   Diabetes mellitus without complication ([place])    The current medical regimen is effective;  continue present plan and medications.       Relevant Medications   sitaGLIPtin (JANUVIA) 100 MG tablet   Other Relevant Orders   Bayer DCA Hb A1c Waived (Completed)     Other   Hyperlipidemia    The current medical regimen is effective;  continue present plan and medications.           Follow up plan: Return in about 3 months (around 03/18/2017) for Physical Exam, Hemoglobin A1c.

## 2017-02-04 ENCOUNTER — Telehealth: Payer: Self-pay

## 2017-02-04 NOTE — Telephone Encounter (Signed)
Called to schedule Medicare Annual Wellness visit with patient at [facility] before his CPE with Dr.Crissman, left message for patient to call back

## 2017-02-04 NOTE — Telephone Encounter (Signed)
Patient called back, scheduled for AWV. Patient confirmed appt.

## 2017-03-02 ENCOUNTER — Other Ambulatory Visit: Payer: Self-pay | Admitting: Family Medicine

## 2017-03-02 DIAGNOSIS — E119 Type 2 diabetes mellitus without complications: Secondary | ICD-10-CM

## 2017-03-02 DIAGNOSIS — I10 Essential (primary) hypertension: Secondary | ICD-10-CM

## 2017-03-17 ENCOUNTER — Ambulatory Visit (INDEPENDENT_AMBULATORY_CARE_PROVIDER_SITE_OTHER): Payer: Medicare Other

## 2017-03-17 VITALS — BP 145/77 | HR 57 | Temp 98.1°F | Resp 15 | Ht 71.0 in | Wt 182.5 lb

## 2017-03-17 DIAGNOSIS — Z0000 Encounter for general adult medical examination without abnormal findings: Secondary | ICD-10-CM | POA: Diagnosis not present

## 2017-03-17 DIAGNOSIS — I10 Essential (primary) hypertension: Secondary | ICD-10-CM

## 2017-03-17 DIAGNOSIS — R5383 Other fatigue: Secondary | ICD-10-CM

## 2017-03-17 DIAGNOSIS — N400 Enlarged prostate without lower urinary tract symptoms: Secondary | ICD-10-CM | POA: Diagnosis not present

## 2017-03-17 DIAGNOSIS — E119 Type 2 diabetes mellitus without complications: Secondary | ICD-10-CM

## 2017-03-17 DIAGNOSIS — E785 Hyperlipidemia, unspecified: Secondary | ICD-10-CM

## 2017-03-17 LAB — URINALYSIS, ROUTINE W REFLEX MICROSCOPIC
BILIRUBIN UA: NEGATIVE
Glucose, UA: NEGATIVE
Ketones, UA: NEGATIVE
LEUKOCYTES UA: NEGATIVE
NITRITE UA: NEGATIVE
Protein, UA: NEGATIVE
RBC UA: NEGATIVE
SPEC GRAV UA: 1.025 (ref 1.005–1.030)
Urobilinogen, Ur: 0.2 mg/dL (ref 0.2–1.0)
pH, UA: 5.5 (ref 5.0–7.5)

## 2017-03-17 NOTE — Patient Instructions (Addendum)
Adam Frost , Thank you for taking time to come for your Medicare Wellness Visit. I appreciate your ongoing commitment to your health goals. Please review the following plan we discussed and let me know if I can assist you in the future.   Screening recommendations/referrals: Colonoscopy: no longer required, due in 2021 Recommended yearly ophthalmology/optometry visit for glaucoma screening and checkup Recommended yearly dental visit for hygiene and checkup  Vaccinations: Influenza vaccine: up to date  Pneumococcal vaccine: up to date  Tdap vaccine: up to date  Shingles vaccine: up to date  Advanced directives: Please bring a copy of your health care power of attorney and living will to the office at your convenience.  Conditions/risks identified: none  Next appointment: Follow up on 03/22/2017 at 9:00am with Dr.Crissman. Follow up in one year for your annual wellness exam.   Preventive Care 65 Years and Older, Male Preventive care refers to lifestyle choices and visits with your health care provider that can promote health and wellness. What does preventive care include?  A yearly physical exam. This is also called an annual well check.  Dental exams once or twice a year.  Routine eye exams. Ask your health care provider how often you should have your eyes checked.  Personal lifestyle choices, including:  Daily care of your teeth and gums.  Regular physical activity.  Eating a healthy diet.  Avoiding tobacco and drug use.  Limiting alcohol use.  Practicing safe sex.  Taking low doses of aspirin every day.  Taking vitamin and mineral supplements as recommended by your health care provider. What happens during an annual well check? The services and screenings done by your health care provider during your annual well check will depend on your age, overall health, lifestyle risk factors, and family history of disease. Counseling  Your health care provider may ask you  questions about your:  Alcohol use.  Tobacco use.  Drug use.  Emotional well-being.  Home and relationship well-being.  Sexual activity.  Eating habits.  History of falls.  Memory and ability to understand (cognition).  Work and work Statistician. Screening  You may have the following tests or measurements:  Height, weight, and BMI.  Blood pressure.  Lipid and cholesterol levels. These may be checked every 5 years, or more frequently if you are over 12 years old.  Skin check.  Lung cancer screening. You may have this screening every year starting at age 55 if you have a 30-pack-year history of smoking and currently smoke or have quit within the past 15 years.  Fecal occult blood test (FOBT) of the stool. You may have this test every year starting at age 39.  Flexible sigmoidoscopy or colonoscopy. You may have a sigmoidoscopy every 5 years or a colonoscopy every 10 years starting at age 77.  Prostate cancer screening. Recommendations will vary depending on your family history and other risks.  Hepatitis C blood test.  Hepatitis B blood test.  Sexually transmitted disease (STD) testing.  Diabetes screening. This is done by checking your blood sugar (glucose) after you have not eaten for a while (fasting). You may have this done every 1-3 years.  Abdominal aortic aneurysm (AAA) screening. You may need this if you are a current or former smoker.  Osteoporosis. You may be screened starting at age 73 if you are at high risk. Talk with your health care provider about your test results, treatment options, and if necessary, the need for more tests. Vaccines  Your health care  provider may recommend certain vaccines, such as:  Influenza vaccine. This is recommended every year.  Tetanus, diphtheria, and acellular pertussis (Tdap, Td) vaccine. You may need a Td booster every 10 years.  Zoster vaccine. You may need this after age 27.  Pneumococcal 13-valent conjugate  (PCV13) vaccine. One dose is recommended after age 54.  Pneumococcal polysaccharide (PPSV23) vaccine. One dose is recommended after age 54. Talk to your health care provider about which screenings and vaccines you need and how often you need them. This information is not intended to replace advice given to you by your health care provider. Make sure you discuss any questions you have with your health care provider. Document Released: 05/16/2015 Document Revised: 01/07/2016 Document Reviewed: 02/18/2015 Elsevier Interactive Patient Education  2017 [place] Prevention in the Home Falls can cause injuries. They can happen to people of all ages. There are many things you can do to make your home safe and to help prevent falls. What can I do on the outside of my home?  Regularly fix the edges of walkways and driveways and fix any cracks.  Remove anything that might make you trip as you walk through a door, such as a raised step or threshold.  Trim any bushes or trees on the path to your home.  Use bright outdoor lighting.  Clear any walking paths of anything that might make someone trip, such as rocks or tools.  Regularly check to see if handrails are loose or broken. Make sure that both sides of any steps have handrails.  Any raised decks and porches should have guardrails on the edges.  Have any leaves, snow, or ice cleared regularly.  Use sand or salt on walking paths during winter.  Clean up any spills in your garage right away. This includes oil or grease spills. What can I do in the bathroom?  Use night lights.  Install grab bars by the toilet and in the tub and shower. Do not use towel bars as grab bars.  Use non-skid mats or decals in the tub or shower.  If you need to sit down in the shower, use a plastic, non-slip stool.  Keep the floor dry. Clean up any water that spills on the floor as soon as it happens.  Remove soap buildup in the tub or shower  regularly.  Attach bath mats securely with double-sided non-slip rug tape.  Do not have throw rugs and other things on the floor that can make you trip. What can I do in the bedroom?  Use night lights.  Make sure that you have a light by your bed that is easy to reach.  Do not use any sheets or blankets that are too big for your bed. They should not hang down onto the floor.  Have a firm chair that has side arms. You can use this for support while you get dressed.  Do not have throw rugs and other things on the floor that can make you trip. What can I do in the kitchen?  Clean up any spills right away.  Avoid walking on wet floors.  Keep items that you use a lot in easy-to-reach places.  If you need to reach something above you, use a strong step stool that has a grab bar.  Keep electrical cords out of the way.  Do not use floor polish or wax that makes floors slippery. If you must use wax, use non-skid floor wax.  Do not have throw  rugs and other things on the floor that can make you trip. What can I do with my stairs?  Do not leave any items on the stairs.  Make sure that there are handrails on both sides of the stairs and use them. Fix handrails that are broken or loose. Make sure that handrails are as long as the stairways.  Check any carpeting to make sure that it is firmly attached to the stairs. Fix any carpet that is loose or worn.  Avoid having throw rugs at the top or bottom of the stairs. If you do have throw rugs, attach them to the floor with carpet tape.  Make sure that you have a light switch at the top of the stairs and the bottom of the stairs. If you do not have them, ask someone to add them for you. What else can I do to help prevent falls?  Wear shoes that:  Do not have high heels.  Have rubber bottoms.  Are comfortable and fit you well.  Are closed at the toe. Do not wear sandals.  If you use a stepladder:  Make sure that it is fully  opened. Do not climb a closed stepladder.  Make sure that both sides of the stepladder are locked into place.  Ask someone to hold it for you, if possible.  Clearly mark and make sure that you can see:  Any grab bars or handrails.  First and last steps.  Where the edge of each step is.  Use tools that help you move around (mobility aids) if they are needed. These include:  Canes.  Walkers.  Scooters.  Crutches.  Turn on the lights when you go into a dark area. Replace any light bulbs as soon as they burn out.  Set up your furniture so you have a clear path. Avoid moving your furniture around.  If any of your floors are uneven, fix them.  If there are any pets around you, be aware of where they are.  Review your medicines with your doctor. Some medicines can make you feel dizzy. This can increase your chance of falling. Ask your doctor what other things that you can do to help prevent falls. This information is not intended to replace advice given to you by your health care provider. Make sure you discuss any questions you have with your health care provider. Document Released: 02/13/2009 Document Revised: 09/25/2015 Document Reviewed: 05/24/2014 Elsevier Interactive Patient Education  2017 [employer].

## 2017-03-17 NOTE — Progress Notes (Signed)
Subjective:   Adam Frost is a 76 y.o. male who presents for Medicare Annual/Subsequent preventive examination.  Review of Systems:  Cardiac Risk Factors include: hypertension;advanced age (>90men, >20 women);male gender;diabetes mellitus;dyslipidemia     Objective:    Vitals: BP (!) 145/77 (BP Location: Left Arm, Patient Position: Sitting)   Pulse (!) 57   Temp 98.1 F (36.7 C) (Temporal)   Resp 15   Ht 5\' 11"  (1.803 m)   Wt 182 lb 8 oz (82.8 kg)   BMI 25.45 kg/m   Body mass index is 25.45 kg/m.  Tobacco Social History   Tobacco Use  Smoking Status Never Smoker  Smokeless Tobacco Never Used     Counseling given: Not Answered   Past Medical History:  Diagnosis Date  . Abdominal hernia   . Diabetes mellitus without complication ([place])   . Hemorrhoid   . Hyperlipidemia   . Hypertension    Past Surgical History:  Procedure Laterality Date  . COLONOSCOPY    . COLONOSCOPY WITH PROPOFOL N/A 04/17/2015   Procedure: COLONOSCOPY WITH PROPOFOL;  Surgeon: Hulen Luster, MD;  Location: [facility] ENDOSCOPY;  Service: Gastroenterology;  Laterality: N/A;  . HERNIA REPAIR    . HYDROCELE EXCISION / REPAIR     Family History  Problem Relation Age of Onset  . Heart disease Mother   . Colon cancer Father   . Colon cancer Brother    Social History   Substance and Sexual Activity  Sexual Activity Not on file    Outpatient Encounter Medications as of 03/17/2017  Medication Sig  . amLODipine (NORVASC) 5 MG tablet TAKE 1 TABLET (5 MG TOTAL) BY MOUTH DAILY.  Marland Kitchen aspirin EC 81 MG tablet Take 81 mg by mouth daily.  Marland Kitchen glipiZIDE-metformin (METAGLIP) 2.5-500 MG tablet TAKE 2 TABLETS BY MOUTH TWICE A DAY BEFORE A MEAL  . quinapril (ACCUPRIL) 40 MG tablet TAKE 1 TABLET (40 MG TOTAL) BY MOUTH AT BEDTIME.  . simvastatin (ZOCOR) 40 MG tablet TAKE 1 TABLET (40 MG TOTAL) BY MOUTH AT BEDTIME.  . sitaGLIPtin (JANUVIA) 100 MG tablet Take 0.5 tablets (50 mg total) by mouth daily.  Marland Kitchen triamcinolone  cream (KENALOG) 0.1 % APPLY TWICE DAILY TO AFFECTED RASH AS NEEDED   No facility-administered encounter medications on file as of 03/17/2017.     Activities of Daily Living In your present state of health, do you have any difficulty performing the following activities: 03/17/2017  Hearing? N  Vision? N  Difficulty concentrating or making decisions? N  Walking or climbing stairs? N  Dressing or bathing? N  Doing errands, shopping? N  Preparing Food and eating ? N  Using the Toilet? N  In the past six months, have you accidently leaked urine? N  Do you have problems with loss of bowel control? N  Managing your Medications? N  Managing your Finances? N  Housekeeping or managing your Housekeeping? N  Some recent data might be hidden    Patient Care Team: Guadalupe Maple, MD as PCP - General (Family Medicine) Dingeldein, Remo Lipps, MD (Ophthalmology)   Assessment:     Exercise Activities and Dietary recommendations Current Exercise Habits: The patient does not participate in regular exercise at present, Exercise limited by: None identified  Goals    None     Fall Risk Fall Risk  03/17/2017 02/17/2016 01/22/2015  Falls in the past year? No No No   Depression Screen PHQ 2/9 Scores 03/17/2017 02/17/2016 01/22/2015  PHQ - 2 Score  0 0 1  PHQ- 9 Score 0 - -    Cognitive Function        Immunization History  Administered Date(s) Administered  . Influenza, High Dose Seasonal PF 02/08/2017  . Influenza,inj,Quad PF,6+ Mos 01/22/2015  . Influenza-Unspecified 12/23/2015  . Pneumococcal Conjugate-13 11/06/2013  . Pneumococcal Polysaccharide-23 04/24/2015  . Pneumococcal-Unspecified 05/03/1996, 10/31/2001  . Tdap 11/11/2015  . Zoster 01/13/2006   Screening Tests Health Maintenance  Topic Date Due  . FOOT EXAM  05/31/2017  . HEMOGLOBIN A1C  06/18/2017  . OPHTHALMOLOGY EXAM  10/11/2017  . COLONOSCOPY  04/16/2020  . TETANUS/TDAP  11/10/2025  . INFLUENZA VACCINE  Completed    . PNA vac Low Risk Adult  Completed      Plan:     I have personally reviewed and addressed the Medicare Annual Wellness questionnaire and have noted the following in the patient's chart:  A. Medical and social history B. Use of alcohol, tobacco or illicit drugs  C. Current medications and supplements D. Functional ability and status E.  Nutritional status F.  Physical activity G. Advance directives H. List of other physicians I.  Hospitalizations, surgeries, and ER visits in previous 12 months J.  [place] such as hearing and vision if needed, cognitive and depression L. Referrals and appointments   In addition, I have reviewed and discussed with patient certain preventive protocols, quality metrics, and best practice recommendations. A written personalized care plan for preventive services as well as general preventive health recommendations were provided to patient.   Signed,  Tyler Aas, LPN Nurse Health Advisor   Nurse Notes: none

## 2017-03-18 LAB — COMPREHENSIVE METABOLIC PANEL
ALT: 11 IU/L (ref 0–44)
AST: 14 IU/L (ref 0–40)
Albumin/Globulin Ratio: 2 (ref 1.2–2.2)
Albumin: 4.5 g/dL (ref 3.5–4.8)
Alkaline Phosphatase: 80 IU/L (ref 39–117)
BUN/Creatinine Ratio: 16 (ref 10–24)
BUN: 13 mg/dL (ref 8–27)
Bilirubin Total: 0.6 mg/dL (ref 0.0–1.2)
CALCIUM: 9.9 mg/dL (ref 8.6–10.2)
CO2: 24 mmol/L (ref 20–29)
CREATININE: 0.82 mg/dL (ref 0.76–1.27)
Chloride: 101 mmol/L (ref 96–106)
GFR calc Af Amer: 98 mL/min/{1.73_m2} (ref >59)
GFR, EST NON AFRICAN AMERICAN: 85 mL/min/{1.73_m2} (ref >59)
GLUCOSE: 109 mg/dL — AB (ref 65–99)
Globulin, Total: 2.3 g/dL (ref 1.5–4.5)
POTASSIUM: 4.5 mmol/L (ref 3.5–5.2)
Sodium: 141 mmol/L (ref 134–144)
TOTAL PROTEIN: 6.8 g/dL (ref 6.0–8.5)

## 2017-03-18 LAB — CBC
Hematocrit: 43.8 % (ref 37.5–51.0)
Hemoglobin: 14.6 g/dL (ref 13.0–17.7)
MCH: 30.8 pg (ref 26.6–33.0)
MCHC: 33.3 g/dL (ref 31.5–35.7)
MCV: 92 fL (ref 79–97)
PLATELETS: 201 10*3/uL (ref 150–379)
RBC: 4.74 x10E6/uL (ref 4.14–5.80)
RDW: 13.8 % (ref 12.3–15.4)
WBC: 5.1 10*3/uL (ref 3.4–10.8)

## 2017-03-18 LAB — PSA: Prostate Specific Ag, Serum: 0.5 ng/mL (ref 0.0–4.0)

## 2017-03-18 LAB — TSH: TSH: 2.12 u[IU]/mL (ref 0.450–4.500)

## 2017-03-22 ENCOUNTER — Ambulatory Visit: Payer: Medicare Other | Admitting: Family Medicine

## 2017-03-22 ENCOUNTER — Encounter: Payer: Self-pay | Admitting: Family Medicine

## 2017-03-22 ENCOUNTER — Other Ambulatory Visit: Payer: Self-pay

## 2017-03-22 VITALS — BP 128/60 | HR 61 | Wt 182.0 lb

## 2017-03-22 DIAGNOSIS — Z0000 Encounter for general adult medical examination without abnormal findings: Secondary | ICD-10-CM

## 2017-03-22 DIAGNOSIS — E782 Mixed hyperlipidemia: Secondary | ICD-10-CM

## 2017-03-22 DIAGNOSIS — I10 Essential (primary) hypertension: Secondary | ICD-10-CM

## 2017-03-22 DIAGNOSIS — E119 Type 2 diabetes mellitus without complications: Secondary | ICD-10-CM

## 2017-03-22 DIAGNOSIS — Z1329 Encounter for screening for other suspected endocrine disorder: Secondary | ICD-10-CM

## 2017-03-22 DIAGNOSIS — E7800 Pure hypercholesterolemia, unspecified: Secondary | ICD-10-CM

## 2017-03-22 DIAGNOSIS — Z7189 Other specified counseling: Secondary | ICD-10-CM

## 2017-03-22 DIAGNOSIS — E785 Hyperlipidemia, unspecified: Secondary | ICD-10-CM

## 2017-03-22 DIAGNOSIS — N400 Enlarged prostate without lower urinary tract symptoms: Secondary | ICD-10-CM

## 2017-03-22 MED ORDER — GLIPIZIDE-METFORMIN HCL 2.5-500 MG PO TABS: 2.0000 | ORAL_TABLET | Freq: Two times a day (BID) | ORAL | 4 refills | Status: DC

## 2017-03-22 MED ORDER — AMLODIPINE BESYLATE 5 MG PO TABS: 5.0000 mg | ORAL_TABLET | Freq: Every day | ORAL | 4 refills | Status: DC

## 2017-03-22 MED ORDER — SIMVASTATIN 40 MG PO TABS: 40.0000 mg | ORAL_TABLET | Freq: Every day | ORAL | 4 refills | Status: DC

## 2017-03-22 MED ORDER — SITAGLIPTIN PHOSPHATE 50 MG PO TABS: 50.0000 mg | ORAL_TABLET | Freq: Every day | ORAL | 4 refills | Status: DC

## 2017-03-22 MED ORDER — QUINAPRIL HCL 40 MG PO TABS: 40.0000 mg | ORAL_TABLET | Freq: Every day | ORAL | 4 refills | Status: DC

## 2017-03-22 NOTE — Assessment & Plan Note (Signed)
A voluntary discussion about advance care planning including the explanation and discussion of advance directives was extensively discussed  with the patient.  Explanation about the health care proxy and Living will was reviewed and packet with forms with explanation of how to fill them out was given.    

## 2017-03-22 NOTE — Progress Notes (Signed)
BP 128/60 (BP Location: Left Arm)   Pulse 61   Wt 182 lb (82.6 kg)   SpO2 98%   BMI 25.38 kg/m    Subjective:    Patient ID: Adam Frost, male    DOB: [birth year], 76 y.o.   MRN: [id number]  HPI: Adam Frost is a 76 y.o. male  Chief Complaint  Patient presents with  . Annual Exam   Patient follow-up hypertension doing well no complaints from medications and takes faithfully. Diabetes also good with no low blood sugar spells no issues or concerns takes medications faithfully. Same with cholesterol takes faithfullthout side effects or problems ittle bit itchy on back is helped with triamcinolone uses a little bit sparingly.  Relevant past medical, surgical, family and social history reviewed and updated as indicated. Interim medical history since our last visit reviewed. Allergies and medications reviewed and updated.  Review of Systems  Constitutional: Negative.   HENT: Negative.   Eyes: Negative.   Respiratory: Negative.   Cardiovascular: Negative.   Gastrointestinal: Negative.   Endocrine: Negative.   Genitourinary: Negative.   Musculoskeletal: Negative.   Skin: Negative.   Allergic/Immunologic: Negative.   Neurological: Negative.   Hematological: Negative.   Psychiatric/Behavioral: Negative.     Per HPI unless specifically indicated above     Objective:    BP 128/60 (BP Location: Left Arm)   Pulse 61   Wt 182 lb (82.6 kg)   SpO2 98%   BMI 25.38 kg/m   Wt Readings from Last 3 Encounters:  03/22/17 182 lb (82.6 kg)  03/17/17 182 lb 8 oz (82.8 kg)  12/16/16 189 lb 3.2 oz (85.8 kg)    Physical Exam  Constitutional: He is oriented to person, place, and time. He appears well-developed and well-nourished.  HENT:  Head: Normocephalic and atraumatic.  Right Ear: External ear normal.  Left Ear: External ear normal.  Eyes: Conjunctivae and EOM are normal. Pupils are equal, round, and reactive to light.  Neck: Normal range of motion. Neck supple.    Cardiovascular: Normal rate, regular rhythm, normal heart sounds and intact distal pulses.  Pulmonary/Chest: Effort normal and breath sounds normal.  Abdominal: Soft. Bowel sounds are normal. There is no splenomegaly or hepatomegaly.  Genitourinary: Rectum normal and penis normal.  Genitourinary Comments: BPH changes  Musculoskeletal: Normal range of motion.  Neurological: He is alert and oriented to person, place, and time. He has normal reflexes.  Skin: No rash noted. No erythema.  Psychiatric: He has a normal mood and affect. His behavior is normal. Judgment and thought content normal.    Results for orders placed or performed in visit on 03/17/17  CBC  Result Value Ref Range   WBC 5.1 3.4 - 10.8 x10E3/uL   RBC 4.74 4.14 - 5.80 x10E6/uL   Hemoglobin 14.6 13.0 - 17.7 g/dL   Hematocrit 43.8 37.5 - 51.0 %   MCV 92 79 - 97 fL   MCH 30.8 26.6 - 33.0 pg   MCHC 33.3 31.5 - 35.7 g/dL   RDW 13.8 12.3 - 15.4 %   Platelets 201 150 - 379 x10E3/uL  Comp Met (CMET)  Result Value Ref Range   Glucose 109 (H) 65 - 99 mg/dL   BUN 13 8 - 27 mg/dL   Creatinine, Ser 0.82 0.76 - 1.27 mg/dL   GFR calc non Af Amer 85 >59 mL/min/1.73   GFR calc Af Amer 98 >59 mL/min/1.73   BUN/Creatinine Ratio 16 10 - 24   Sodium  141 134 - 144 mmol/L   Potassium 4.5 3.5 - 5.2 mmol/L   Chloride 101 96 - 106 mmol/L   CO2 24 20 - 29 mmol/L   Calcium 9.9 8.6 - 10.2 mg/dL   Total Protein 6.8 6.0 - 8.5 g/dL   Albumin 4.5 3.5 - 4.8 g/dL   Globulin, Total 2.3 1.5 - 4.5 g/dL   Albumin/Globulin Ratio 2.0 1.2 - 2.2   Bilirubin Total 0.6 0.0 - 1.2 mg/dL   Alkaline Phosphatase 80 39 - 117 IU/L   AST 14 0 - 40 IU/L   ALT 11 0 - 44 IU/L  PSA  Result Value Ref Range   Prostate Specific Ag, Serum 0.5 0.0 - 4.0 ng/mL  TSH  Result Value Ref Range   TSH 2.120 0.450 - 4.500 uIU/mL  Urinalysis, Routine w reflex microscopic  Result Value Ref Range   Specific Gravity, UA 1.025 1.005 - 1.030   pH, UA 5.5 5.0 - 7.5   Color,  UA Orange Yellow   Appearance Ur Hazy (A) Clear   Leukocytes, UA Negative Negative   Protein, UA Negative Negative/Trace   Glucose, UA Negative Negative   Ketones, UA Negative Negative   RBC, UA Negative Negative   Bilirubin, UA Negative Negative   Urobilinogen, Ur 0.2 0.2 - 1.0 mg/dL   Nitrite, UA Negative Negative      Assessment & Plan:   Problem List Items Addressed This Visit      Cardiovascular and Mediastinum   Essential hypertension - Primary    The current medical regimen is effective;  continue present plan and medications.         Endocrine   Diabetes mellitus without complication ([place])    The current medical regimen is effective;  continue present plan and medications.         Genitourinary   BPH (benign prostatic hyperplasia)    stable        Other   Hyperlipidemia    The current medical regimen is effective;  continue present plan and medications.       Advanced care planning/counseling discussion    A voluntary discussion about advance care planning including the explanation and discussion of advance directives was extensively discussed  with the patient.  Explanation about the health care proxy and Living will was reviewed and packet with forms with explanation of how to fill them out was given.        Other Visit Diagnoses    Thyroid disorder screen       PE (physical exam), annual           Follow up plan: Return in about 6 months (around 09/19/2017) for Hemoglobin A1c,  Lipids, ALT, AST.

## 2017-03-22 NOTE — Assessment & Plan Note (Signed)
The current medical regimen is effective;  continue present plan and medications.  

## 2017-03-22 NOTE — Assessment & Plan Note (Signed)
stable °

## 2017-03-28 ENCOUNTER — Encounter: Payer: Self-pay | Admitting: Family Medicine

## 2017-03-30 LAB — LIPID PANEL
CHOLESTEROL TOTAL: 149 mg/dL (ref 100–199)
Chol/HDL Ratio: 3.4 ratio (ref 0.0–5.0)
HDL: 44 mg/dL (ref >39)
LDL CALC: 92 mg/dL (ref 0–99)
TRIGLYCERIDES: 63 mg/dL (ref 0–149)
VLDL CHOLESTEROL CAL: 13 mg/dL (ref 5–40)

## 2017-03-30 LAB — HGB A1C W/O EAG: Hgb A1c MFr Bld: 6.2 % — ABNORMAL HIGH (ref 4.8–5.6)

## 2017-03-30 LAB — SPECIMEN STATUS REPORT

## 2017-06-28 ENCOUNTER — Encounter: Payer: Self-pay | Admitting: Family Medicine

## 2017-09-20 ENCOUNTER — Ambulatory Visit: Payer: Medicare Other | Admitting: Family Medicine

## 2017-10-11 ENCOUNTER — Ambulatory Visit (INDEPENDENT_AMBULATORY_CARE_PROVIDER_SITE_OTHER): Payer: Medicare Other | Admitting: Family Medicine

## 2017-10-11 ENCOUNTER — Encounter: Payer: Self-pay | Admitting: Family Medicine

## 2017-10-11 VITALS — BP 132/60 | HR 60 | Ht 70.0 in | Wt 180.0 lb

## 2017-10-11 DIAGNOSIS — E119 Type 2 diabetes mellitus without complications: Secondary | ICD-10-CM

## 2017-10-11 DIAGNOSIS — E7800 Pure hypercholesterolemia, unspecified: Secondary | ICD-10-CM | POA: Diagnosis not present

## 2017-10-11 DIAGNOSIS — I10 Essential (primary) hypertension: Secondary | ICD-10-CM | POA: Diagnosis not present

## 2017-10-11 LAB — LP+ALT+AST PICCOLO, WAIVED
ALT (SGPT) PICCOLO, WAIVED: 18 U/L (ref 10–47)
AST (SGOT) PICCOLO, WAIVED: 19 U/L (ref 11–38)
CHOL/HDL RATIO PICCOLO,WAIVE: 3.1 mg/dL
Cholesterol Piccolo, Waived: 155 mg/dL (ref <200)
HDL Chol Piccolo, Waived: 51 mg/dL — ABNORMAL LOW (ref >59)
LDL Chol Calc Piccolo Waived: 92 mg/dL (ref <100)
Triglycerides Piccolo,Waived: 63 mg/dL (ref <150)
VLDL Chol Calc Piccolo,Waive: 13 mg/dL (ref <30)

## 2017-10-11 LAB — BAYER DCA HB A1C WAIVED: HB A1C: 6.3 % (ref <7.0)

## 2017-10-11 NOTE — Assessment & Plan Note (Signed)
The current medical regimen is effective;  continue present plan and medications.  

## 2017-10-11 NOTE — Progress Notes (Signed)
BP 132/60 (BP Location: Left Arm)   Pulse 60   Ht 5' 10"  (1.778 m)   Wt 180 lb (81.6 kg)   SpO2 98%   BMI 25.83 kg/m    Subjective:    Patient ID: Adam Frost, male    DOB: 1940/11/28, 77 y.o.   MRN: [id number]  HPI: Adam Frost is a 77 y.o. male  Recheck DM, htn, lipids All in all doing well no real complaints or concerns.  Taking medications without problems good control of blood pressure cholesterol diabetes no low blood sugar spells are problems.  Relevant past medical, surgical, family and social history reviewed and updated as indicated. Interim medical history since our last visit reviewed. Allergies and medications reviewed and updated.  Review of Systems  Constitutional: Negative.   Respiratory: Negative.   Cardiovascular: Negative.     Per HPI unless specifically indicated above     Objective:    BP 132/60 (BP Location: Left Arm)   Pulse 60   Ht 5' 10"  (1.778 m)   Wt 180 lb (81.6 kg)   SpO2 98%   BMI 25.83 kg/m   Wt Readings from Last 3 Encounters:  10/11/17 180 lb (81.6 kg)  03/22/17 182 lb (82.6 kg)  03/17/17 182 lb 8 oz (82.8 kg)    Physical Exam  Constitutional: He is oriented to person, place, and time. He appears well-developed and well-nourished.  HENT:  Head: Normocephalic and atraumatic.  Eyes: Conjunctivae and EOM are normal.  Neck: Normal range of motion.  Cardiovascular: Normal rate, regular rhythm and normal heart sounds.  Pulmonary/Chest: Effort normal and breath sounds normal.  Musculoskeletal: Normal range of motion.  Neurological: He is alert and oriented to person, place, and time.  Skin: No erythema.  Psychiatric: He has a normal mood and affect. His behavior is normal. Judgment and thought content normal.    Results for orders placed or performed in visit on 03/17/17  CBC  Result Value Ref Range   WBC 5.1 3.4 - 10.8 x10E3/uL   RBC 4.74 4.14 - 5.80 x10E6/uL   Hemoglobin 14.6 13.0 - 17.7 g/dL   Hematocrit 43.8 37.5 -  51.0 %   MCV 92 79 - 97 fL   MCH 30.8 26.6 - 33.0 pg   MCHC 33.3 31.5 - 35.7 g/dL   RDW 13.8 12.3 - 15.4 %   Platelets 201 150 - 379 x10E3/uL  Comp Met (CMET)  Result Value Ref Range   Glucose 109 (H) 65 - 99 mg/dL   BUN 13 8 - 27 mg/dL   Creatinine, Ser 0.82 0.76 - 1.27 mg/dL   GFR calc non Af Amer 85 >59 mL/min/1.73   GFR calc Af Amer 98 >59 mL/min/1.73   BUN/Creatinine Ratio 16 10 - 24   Sodium 141 134 - 144 mmol/L   Potassium 4.5 3.5 - 5.2 mmol/L   Chloride 101 96 - 106 mmol/L   CO2 24 20 - 29 mmol/L   Calcium 9.9 8.6 - 10.2 mg/dL   Total Protein 6.8 6.0 - 8.5 g/dL   Albumin 4.5 3.5 - 4.8 g/dL   Globulin, Total 2.3 1.5 - 4.5 g/dL   Albumin/Globulin Ratio 2.0 1.2 - 2.2   Bilirubin Total 0.6 0.0 - 1.2 mg/dL   Alkaline Phosphatase 80 39 - 117 IU/L   AST 14 0 - 40 IU/L   ALT 11 0 - 44 IU/L  PSA  Result Value Ref Range   Prostate Specific Ag, Serum 0.5 0.0 -  4.0 ng/mL  TSH  Result Value Ref Range   TSH 2.120 0.450 - 4.500 uIU/mL  Urinalysis, Routine w reflex microscopic  Result Value Ref Range   Specific Gravity, UA 1.025 1.005 - 1.030   pH, UA 5.5 5.0 - 7.5   Color, UA Orange Yellow   Appearance Ur Hazy (A) Clear   Leukocytes, UA Negative Negative   Protein, UA Negative Negative/Trace   Glucose, UA Negative Negative   Ketones, UA Negative Negative   RBC, UA Negative Negative   Bilirubin, UA Negative Negative   Urobilinogen, Ur 0.2 0.2 - 1.0 mg/dL   Nitrite, UA Negative Negative  Lipid panel  Result Value Ref Range   Cholesterol, Total 149 100 - 199 mg/dL   Triglycerides 63 0 - 149 mg/dL   HDL 44 >39 mg/dL   VLDL Cholesterol Cal 13 5 - 40 mg/dL   LDL Calculated 92 0 - 99 mg/dL   Chol/HDL Ratio 3.4 0.0 - 5.0 ratio  Hgb A1c w/o eAG  Result Value Ref Range   Hgb A1c MFr Bld 6.2 (H) 4.8 - 5.6 %  Specimen status report  Result Value Ref Range   specimen status report Comment       Assessment & Plan:   Problem List Items Addressed This Visit       Cardiovascular and Mediastinum   Essential hypertension - Primary    The current medical regimen is effective;  continue present plan and medications.       Relevant Orders   Bayer DCA Hb A1c Waived   LP+ALT+AST [place], [place]     Endocrine   Diabetes mellitus without complication ([place])    The current medical regimen is effective;  continue present plan and medications.       Relevant Orders   Bayer DCA Hb A1c Waived   LP+ALT+AST [place], Waived     Other   Hyperlipidemia    The current medical regimen is effective;  continue present plan and medications.       Relevant Orders   Bayer DCA Hb A1c Waived   LP+ALT+AST [place], Waived       Follow up plan: Return in about 6 months (around 04/12/2018) for Physical Exam, Hemoglobin A1c.

## 2018-03-20 ENCOUNTER — Ambulatory Visit: Payer: Medicare Other

## 2018-04-10 LAB — HM DIABETES EYE EXAM

## 2018-04-13 ENCOUNTER — Ambulatory Visit (INDEPENDENT_AMBULATORY_CARE_PROVIDER_SITE_OTHER): Payer: Medicare Other

## 2018-04-13 ENCOUNTER — Other Ambulatory Visit: Payer: Self-pay | Admitting: Family Medicine

## 2018-04-13 VITALS — BP 152/60 | HR 51 | Temp 97.7°F | Ht 70.0 in | Wt 184.0 lb

## 2018-04-13 DIAGNOSIS — E119 Type 2 diabetes mellitus without complications: Secondary | ICD-10-CM

## 2018-04-13 DIAGNOSIS — E7800 Pure hypercholesterolemia, unspecified: Secondary | ICD-10-CM

## 2018-04-13 DIAGNOSIS — I10 Essential (primary) hypertension: Secondary | ICD-10-CM | POA: Diagnosis not present

## 2018-04-13 DIAGNOSIS — Z0000 Encounter for general adult medical examination without abnormal findings: Secondary | ICD-10-CM

## 2018-04-13 DIAGNOSIS — N400 Enlarged prostate without lower urinary tract symptoms: Secondary | ICD-10-CM | POA: Diagnosis not present

## 2018-04-13 MED ORDER — ZOSTER VAC RECOMB ADJUVANTED 50 MCG/0.5ML IM SUSR: 0.5000 mL | Freq: Once | INTRAMUSCULAR | 1 refills | Status: AC

## 2018-04-13 NOTE — Patient Instructions (Addendum)
Mr. Adam Frost , Thank you for taking time to come for your Medicare Wellness Visit. I appreciate your ongoing commitment to your health goals. Please review the following plan we discussed and let me know if I can assist you in the future.   Screening recommendations/referrals: Colonoscopy excluded, over age 79 Recommended yearly ophthalmology/optometry visit for glaucoma screening and checkup Recommended yearly dental visit for hygiene and checkup  Vaccinations: Influenza vaccine up to date Pneumococcal vaccine up to date, completed Tdap vaccine up to date, due 11/10/2025 Shingles vaccine due, ordered to pharmacy    Advanced directives: Please bring [place] a copy of living will  Conditions/risks identified: none  Next appointment: Dr. Jeananne Rama 04/20/2018 @ 8am             Medicare Wellness Visit 04/16/2019 @ 8:15am  Preventive Care 79 Years and Older, Male Preventive care refers to lifestyle choices and visits with your health care provider that can promote health and wellness. What does preventive care include?  A yearly physical exam. This is also called an annual well check.  Dental exams once or twice a year.  Routine eye exams. Ask your health care provider how often you should have your eyes checked.  Personal lifestyle choices, including:  Daily care of your teeth and gums.  Regular physical activity.  Eating a healthy diet.  Avoiding tobacco and drug use.  Limiting alcohol use.  Practicing safe sex.  Taking low doses of aspirin every day.  Taking vitamin and mineral supplements as recommended by your health care provider. What happens during an annual well check? The services and screenings done by your health care provider during your annual well check will depend on your age, overall health, lifestyle risk factors, and family history of disease. Counseling  Your health care provider may ask you questions about your:  Alcohol use.  Tobacco use.  Drug  use.  Emotional well-being.  Home and relationship well-being.  Sexual activity.  Eating habits.  History of falls.  Memory and ability to understand (cognition).  Work and work Statistician. Screening  You may have the following tests or measurements:  Height, weight, and BMI.  Blood pressure.  Lipid and cholesterol levels. These may be checked every 5 years, or more frequently if you are over 79 years old.  Skin check.  Lung cancer screening. You may have this screening every year starting at age 79 if you have a 30-pack-year history of smoking and currently smoke or have quit within the past 15 years.  Fecal occult blood test (FOBT) of the stool. You may have this test every year starting at age 79.  Flexible sigmoidoscopy or colonoscopy. You may have a sigmoidoscopy every 5 years or a colonoscopy every 10 years starting at age 79.  Prostate cancer screening. Recommendations will vary depending on your family history and other risks.  Hepatitis C blood test.  Hepatitis B blood test.  Sexually transmitted disease (STD) testing.  Diabetes screening. This is done by checking your blood sugar (glucose) after you have not eaten for a while (fasting). You may have this done every 1-3 years.  Abdominal aortic aneurysm (AAA) screening. You may need this if you are a current or former smoker.  Osteoporosis. You may be screened starting at age 79 if you are at high risk. Talk with your health care provider about your test results, treatment options, and if necessary, the need for more tests. Vaccines  Your health care provider may recommend certain vaccines, such as:  Influenza vaccine. This is recommended every year.  Tetanus, diphtheria, and acellular pertussis (Tdap, Td) vaccine. You may need a Td booster every 10 years.  Zoster vaccine. You may need this after age 17.  Pneumococcal 13-valent conjugate (PCV13) vaccine. One dose is recommended after age  66.  Pneumococcal polysaccharide (PPSV23) vaccine. One dose is recommended after age 72. Talk to your health care provider about which screenings and vaccines you need and how often you need them. This information is not intended to replace advice given to you by your health care provider. Make sure you discuss any questions you have with your health care provider. Document Released: 05/16/2015 Document Revised: 01/07/2016 Document Reviewed: 02/18/2015 Elsevier Interactive Patient Education  2017 [place] Prevention in the Home Falls can cause injuries. They can happen to people of all ages. There are many things you can do to make your home safe and to help prevent falls. What can I do on the outside of my home?  Regularly fix the edges of walkways and driveways and fix any cracks.  Remove anything that might make you trip as you walk through a door, such as a raised step or threshold.  Trim any bushes or trees on the path to your home.  Use bright outdoor lighting.  Clear any walking paths of anything that might make someone trip, such as rocks or tools.  Regularly check to see if handrails are loose or broken. Make sure that both sides of any steps have handrails.  Any raised decks and porches should have guardrails on the edges.  Have any leaves, snow, or ice cleared regularly.  Use sand or salt on walking paths during winter.  Clean up any spills in your garage right away. This includes oil or grease spills. What can I do in the bathroom?  Use night lights.  Install grab bars by the toilet and in the tub and shower. Do not use towel bars as grab bars.  Use non-skid mats or decals in the tub or shower.  If you need to sit down in the shower, use a plastic, non-slip stool.  Keep the floor dry. Clean up any water that spills on the floor as soon as it happens.  Remove soap buildup in the tub or shower regularly.  Attach bath mats securely with double-sided  non-slip rug tape.  Do not have throw rugs and other things on the floor that can make you trip. What can I do in the bedroom?  Use night lights.  Make sure that you have a light by your bed that is easy to reach.  Do not use any sheets or blankets that are too big for your bed. They should not hang down onto the floor.  Have a firm chair that has side arms. You can use this for support while you get dressed.  Do not have throw rugs and other things on the floor that can make you trip. What can I do in the kitchen?  Clean up any spills right away.  Avoid walking on wet floors.  Keep items that you use a lot in easy-to-reach places.  If you need to reach something above you, use a strong step stool that has a grab bar.  Keep electrical cords out of the way.  Do not use floor polish or wax that makes floors slippery. If you must use wax, use non-skid floor wax.  Do not have throw rugs and other things on the floor that  can make you trip. What can I do with my stairs?  Do not leave any items on the stairs.  Make sure that there are handrails on both sides of the stairs and use them. Fix handrails that are broken or loose. Make sure that handrails are as long as the stairways.  Check any carpeting to make sure that it is firmly attached to the stairs. Fix any carpet that is loose or worn.  Avoid having throw rugs at the top or bottom of the stairs. If you do have throw rugs, attach them to the floor with carpet tape.  Make sure that you have a light switch at the top of the stairs and the bottom of the stairs. If you do not have them, ask someone to add them for you. What else can I do to help prevent falls?  Wear shoes that:  Do not have high heels.  Have rubber bottoms.  Are comfortable and fit you well.  Are closed at the toe. Do not wear sandals.  If you use a stepladder:  Make sure that it is fully opened. Do not climb a closed stepladder.  Make sure that both  sides of the stepladder are locked into place.  Ask someone to hold it for you, if possible.  Clearly mark and make sure that you can see:  Any grab bars or handrails.  First and last steps.  Where the edge of each step is.  Use tools that help you move around (mobility aids) if they are needed. These include:  Canes.  Walkers.  Scooters.  Crutches.  Turn on the lights when you go into a dark area. Replace any light bulbs as soon as they burn out.  Set up your furniture so you have a clear path. Avoid moving your furniture around.  If any of your floors are uneven, fix them.  If there are any pets around you, be aware of where they are.  Review your medicines with your doctor. Some medicines can make you feel dizzy. This can increase your chance of falling. Ask your doctor what other things that you can do to help prevent falls. This information is not intended to replace advice given to you by your health care provider. Make sure you discuss any questions you have with your health care provider. Document Released: 02/13/2009 Document Revised: 09/25/2015 Document Reviewed: 05/24/2014 Elsevier Interactive Patient Education  2017 [employer].

## 2018-04-13 NOTE — Progress Notes (Signed)
Subjective:   Adam Frost is a 77 y.o. male who presents for Medicare Annual/Subsequent preventive examination.       Objective:    Vitals: BP (!) 152/60 (BP Location: Left Arm, Patient Position: Sitting)   Pulse (!) 51   Temp 97.7 F (36.5 C) (Oral)   Ht 5\' 10"  (1.778 m)   Wt 184 lb (83.5 kg)   BMI 26.40 kg/m   Body mass index is 26.4 kg/m.  Advanced Directives 04/13/2018 03/17/2017  Does Patient Have a Medical Advance Directive? Yes Yes  Type of Advance Directive Living will [place];Living will  Does patient want to make changes to medical advance directive? No - Patient declined -  Copy of [place] in Chart? - No - copy requested    Tobacco Social History   Tobacco Use  Smoking Status Never Smoker  Smokeless Tobacco Never Used     Counseling given: Not Answered   Clinical Intake:  Pre-visit preparation completed: No  Pain : No/denies pain     Diabetes: Yes CBG done?: No Did pt. bring in CBG monitor from home?: No  How often do you need to have someone help you when you read instructions, pamphlets, or other written materials from your doctor or pharmacy?: 1 - Never What is the last grade level you completed in school?: HS  Interpreter Needed?: No  Information entered by :: Tyson Dense, RN  Past Medical History:  Diagnosis Date  . Abdominal hernia   . Diabetes mellitus without complication ([place])   . Hemorrhoid   . Hyperlipidemia   . Hypertension    Past Surgical History:  Procedure Laterality Date  . COLONOSCOPY    . COLONOSCOPY WITH PROPOFOL N/A 04/17/2015   Procedure: COLONOSCOPY WITH PROPOFOL;  Surgeon: Hulen Luster, MD;  Location: [facility] ENDOSCOPY;  Service: Gastroenterology;  Laterality: N/A;  . HERNIA REPAIR    . HYDROCELE EXCISION / REPAIR     Family History  Problem Relation Age of Onset  . Heart disease Mother   . Colon cancer Father   . Colon cancer Brother    Social History    Socioeconomic History  . Marital status: Married    Spouse name: Not on file  . Number of children: Not on file  . Years of education: 52  . Highest education level: 12th grade  Occupational History  . Not on file  Social Needs  . Financial resource strain: Not hard at all  . Food insecurity:    Worry: Never true    Inability: Never true  . Transportation needs:    Medical: No    Non-medical: No  Tobacco Use  . Smoking status: Never Smoker  . Smokeless tobacco: Never Used  Substance and Sexual Activity  . Alcohol use: No  . Drug use: No  . Sexual activity: Not on file  Lifestyle  . Physical activity:    Days per week: 0 days    Minutes per session: 0 min  . Stress: Not at all  Relationships  . Social connections:    Talks on phone: Three times a week    Gets together: Twice a week    Attends religious service: More than 4 times per year    Active member of club or organization: Yes    Attends meetings of clubs or organizations: 1 to 4 times per year    Relationship status: Married  Other Topics Concern  . Not on file  Social History Narrative  . Not on file    Outpatient Encounter Medications as of 04/13/2018  Medication Sig  . amLODipine (NORVASC) 5 MG tablet Take 1 tablet (5 mg total) by mouth daily.  Marland Kitchen aspirin EC 81 MG tablet Take 81 mg by mouth daily.  . calcium citrate (CALCITRATE - DOSED IN MG ELEMENTAL CALCIUM) 950 MG tablet Take 1,000 mg of elemental calcium by mouth daily.  Marland Kitchen glipiZIDE-metformin (METAGLIP) 2.5-500 MG tablet Take 2 tablets by mouth 2 (two) times daily before a meal.  . quinapril (ACCUPRIL) 40 MG tablet Take 1 tablet (40 mg total) by mouth at bedtime.  . simvastatin (ZOCOR) 40 MG tablet Take 1 tablet (40 mg total) by mouth at bedtime.  . sitaGLIPtin (JANUVIA) 50 MG tablet Take 1 tablet (50 mg total) by mouth daily.  Marland Kitchen triamcinolone cream (KENALOG) 0.1 % APPLY TWICE DAILY TO AFFECTED RASH AS NEEDED  . Zoster Vaccine Adjuvanted [facility])  injection Inject 0.5 mLs into the muscle once for 1 dose.  . [DISCONTINUED] Zoster Vaccine Adjuvanted [facility]) injection Inject 0.5 mLs into the muscle once.   No facility-administered encounter medications on file as of 04/13/2018.     Activities of Daily Living In your present state of health, do you have any difficulty performing the following activities: 04/13/2018  Hearing? N  Vision? N  Difficulty concentrating or making decisions? N  Walking or climbing stairs? N  Dressing or bathing? N  Doing errands, shopping? N  Preparing Food and eating ? N  Using the Toilet? N  In the past six months, have you accidently leaked urine? N  Do you have problems with loss of bowel control? N  Managing your Medications? N  Managing your Finances? N  Housekeeping or managing your Housekeeping? N  Some recent data might be hidden    Patient Care Team: Guadalupe Maple, MD as PCP - General (Family Medicine) Dingeldein, Remo Lipps, MD (Ophthalmology)   Assessment:   This is a routine wellness examination for [facility].  Exercise Activities and Dietary recommendations Current Exercise Habits: The patient does not participate in regular exercise at present, Exercise limited by: None identified  Goals   None     Fall Risk Fall Risk  04/13/2018 10/11/2017 03/17/2017 02/17/2016 01/22/2015  Falls in the past year? 0 No No No No  Number falls in past yr: 0 - - - -  Injury with Fall? 0 - - - -   Is the patient's home free of loose throw rugs in walkways, pet beds, electrical cords, etc?   yes      Grab bars in the bathroom? no      Handrails on the stairs?   yes      Adequate lighting?   yes   Depression Screen PHQ 2/9 Scores 04/13/2018 10/11/2017 03/17/2017 02/17/2016  PHQ - 2 Score 0 0 0 0  PHQ- 9 Score - - 0 -    Cognitive Function     6CIT Screen 04/13/2018  What Year? 0 points  What month? 0 points  What time? 0 points  Count back from 20 0 points  Months in reverse 0 points    Repeat phrase 0 points  Total Score 0    Immunization History  Administered Date(s) Administered  . Influenza, High Dose Seasonal PF 02/08/2017  . Influenza,inj,Quad PF,6+ Mos 01/22/2015  . Influenza-Unspecified 12/23/2015, 02/01/2018  . Pneumococcal Conjugate-13 11/06/2013  . Pneumococcal Polysaccharide-23 04/24/2015  . Pneumococcal-Unspecified 05/03/1996, 10/31/2001  . Tdap 11/11/2015  .  Zoster 01/13/2006    Qualifies for Shingles Vaccine? Yes, educated and ordered to pharmacy  Screening Tests Health Maintenance  Topic Date Due  . HEMOGLOBIN A1C  04/12/2018  . FOOT EXAM  10/12/2018  . OPHTHALMOLOGY EXAM  04/11/2019  . COLONOSCOPY  04/16/2020  . TETANUS/TDAP  11/10/2025  . INFLUENZA VACCINE  Completed  . PNA vac Low Risk Adult  Completed   Cancer Screenings: Lung: Low Dose CT Chest recommended if Age 21-80 years, 30 pack-year currently smoking OR have quit w/in 15years. Patient does not qualify. Colorectal: up to date  Additional Screenings:  Hepatitis C Screening:declined       Plan:    I have personally reviewed and addressed the Medicare Annual Wellness questionnaire and have noted the following in the patient's chart:  A. Medical and social history B. Use of alcohol, tobacco or illicit drugs  C. Current medications and supplements D. Functional ability and status E.  Nutritional status F.  Physical activity G. Advance directives H. List of other physicians I.  Hospitalizations, surgeries, and ER visits in previous 12 months J.  [place] to include hearing, vision, cognitive, depression L. Referrals and appointments - none  In addition, I have reviewed and discussed with patient certain preventive protocols, quality metrics, and best practice recommendations. A written personalized care plan for preventive services as well as general preventive health recommendations were provided to patient.  See attached scanned questionnaire for additional  information.   Signed,   Tyson Dense, RN Nurse Health Advisor  Patient Concerns: None

## 2018-04-14 LAB — PSA: Prostate Specific Ag, Serum: 0.4 ng/mL (ref 0.0–4.0)

## 2018-04-14 LAB — COMPREHENSIVE METABOLIC PANEL
ALK PHOS: 76 IU/L (ref 39–117)
ALT: 10 IU/L (ref 0–44)
AST: 14 IU/L (ref 0–40)
Albumin/Globulin Ratio: 1.8 (ref 1.2–2.2)
Albumin: 4.3 g/dL (ref 3.5–4.8)
BUN/Creatinine Ratio: 15 (ref 10–24)
BUN: 15 mg/dL (ref 8–27)
Bilirubin Total: 0.8 mg/dL (ref 0.0–1.2)
CO2: 24 mmol/L (ref 20–29)
Calcium: 9.7 mg/dL (ref 8.6–10.2)
Chloride: 100 mmol/L (ref 96–106)
Creatinine, Ser: 1.02 mg/dL (ref 0.76–1.27)
GFR calc Af Amer: 80 mL/min/{1.73_m2} (ref >59)
GFR calc non Af Amer: 70 mL/min/{1.73_m2} (ref >59)
Globulin, Total: 2.4 g/dL (ref 1.5–4.5)
Glucose: 68 mg/dL (ref 65–99)
Potassium: 3.9 mmol/L (ref 3.5–5.2)
Sodium: 142 mmol/L (ref 134–144)
Total Protein: 6.7 g/dL (ref 6.0–8.5)

## 2018-04-14 LAB — HEMOGLOBIN A1C
ESTIMATED AVERAGE GLUCOSE: 134 mg/dL
Hgb A1c MFr Bld: 6.3 % — ABNORMAL HIGH (ref 4.8–5.6)

## 2018-04-14 LAB — CBC WITH DIFFERENTIAL/PLATELET
Basophils Absolute: 0.1 10*3/uL (ref 0.0–0.2)
Basos: 2 %
EOS (ABSOLUTE): 0.2 10*3/uL (ref 0.0–0.4)
Eos: 4 %
Hematocrit: 42 % (ref 37.5–51.0)
Hemoglobin: 14.4 g/dL (ref 13.0–17.7)
Immature Grans (Abs): 0 10*3/uL (ref 0.0–0.1)
Immature Granulocytes: 0 %
Lymphocytes Absolute: 0.9 10*3/uL (ref 0.7–3.1)
Lymphs: 21 %
MCH: 30.6 pg (ref 26.6–33.0)
MCHC: 34.3 g/dL (ref 31.5–35.7)
MCV: 89 fL (ref 79–97)
MONOS ABS: 0.4 10*3/uL (ref 0.1–0.9)
Monocytes: 9 %
Neutrophils Absolute: 2.8 10*3/uL (ref 1.4–7.0)
Neutrophils: 64 %
Platelets: 169 10*3/uL (ref 150–450)
RBC: 4.71 x10E6/uL (ref 4.14–5.80)
RDW: 12.1 % — AB (ref 12.3–15.4)
WBC: 4.3 10*3/uL (ref 3.4–10.8)

## 2018-04-14 LAB — LIPID PANEL
Chol/HDL Ratio: 3.3 ratio (ref 0.0–5.0)
Cholesterol, Total: 164 mg/dL (ref 100–199)
HDL: 49 mg/dL (ref >39)
LDL Calculated: 101 mg/dL — ABNORMAL HIGH (ref 0–99)
Triglycerides: 72 mg/dL (ref 0–149)
VLDL Cholesterol Cal: 14 mg/dL (ref 5–40)

## 2018-04-17 ENCOUNTER — Encounter: Payer: Self-pay | Admitting: Family Medicine

## 2018-04-20 ENCOUNTER — Encounter: Payer: Self-pay | Admitting: Family Medicine

## 2018-04-20 ENCOUNTER — Ambulatory Visit (INDEPENDENT_AMBULATORY_CARE_PROVIDER_SITE_OTHER): Payer: Medicare Other | Admitting: Family Medicine

## 2018-04-20 DIAGNOSIS — Z7189 Other specified counseling: Secondary | ICD-10-CM

## 2018-04-20 DIAGNOSIS — E1169 Type 2 diabetes mellitus with other specified complication: Secondary | ICD-10-CM

## 2018-04-20 DIAGNOSIS — E119 Type 2 diabetes mellitus without complications: Secondary | ICD-10-CM

## 2018-04-20 DIAGNOSIS — E782 Mixed hyperlipidemia: Secondary | ICD-10-CM | POA: Diagnosis not present

## 2018-04-20 DIAGNOSIS — I10 Essential (primary) hypertension: Secondary | ICD-10-CM | POA: Diagnosis not present

## 2018-04-20 DIAGNOSIS — N400 Enlarged prostate without lower urinary tract symptoms: Secondary | ICD-10-CM

## 2018-04-20 MED ORDER — AMLODIPINE BESYLATE 5 MG PO TABS: 5.0000 mg | ORAL_TABLET | Freq: Every day | ORAL | 4 refills | Status: DC

## 2018-04-20 MED ORDER — SITAGLIPTIN PHOSPHATE 50 MG PO TABS: 50.0000 mg | ORAL_TABLET | Freq: Every day | ORAL | 4 refills | Status: DC

## 2018-04-20 MED ORDER — HYDROCHLOROTHIAZIDE 12.5 MG PO TABS: 12.5000 mg | ORAL_TABLET | Freq: Every day | ORAL | 1 refills | Status: DC

## 2018-04-20 MED ORDER — ATORVASTATIN CALCIUM 40 MG PO TABS: 40.0000 mg | ORAL_TABLET | Freq: Every day | ORAL | 4 refills | Status: AC

## 2018-04-20 MED ORDER — GLIPIZIDE-METFORMIN HCL 2.5-500 MG PO TABS: 2.0000 | ORAL_TABLET | Freq: Two times a day (BID) | ORAL | 4 refills | Status: DC

## 2018-04-20 MED ORDER — QUINAPRIL HCL 40 MG PO TABS: 40.0000 mg | ORAL_TABLET | Freq: Every day | ORAL | 4 refills | Status: DC

## 2018-04-20 NOTE — Assessment & Plan Note (Signed)
Hypertension poor control here and at home will gently increase medications by using HCTZ 12.5 once a day discuss lightheaded dizziness following with patient will recheck blood pressure this winter.

## 2018-04-20 NOTE — Assessment & Plan Note (Signed)
Discuss hyper hypercholesterol and medication will change from simvastatin to atorvastatin

## 2018-04-20 NOTE — Assessment & Plan Note (Signed)
A voluntary discussion about advanced care planning including explanation and discussion of advanced directives was extentively discussed with the patient.  Explained about the healthcare proxy and living will was reviewed and packet with forms with expiration of how to fill them out was given.  Time spent: Encounter 16+ min individuals present: Patient 

## 2018-04-20 NOTE — Assessment & Plan Note (Signed)
The current medical regimen is effective;  continue present plan and medications.  

## 2018-04-20 NOTE — Progress Notes (Signed)
BP (!) 164/75 (BP Location: Left Arm, Patient Position: Sitting, Cuff Size: Normal)   Pulse (!) 59   Temp 97.9 F (36.6 C)   Ht 5' 9.57" (1.767 m)   Wt 184 lb (83.5 kg)   SpO2 98%   BMI 26.73 kg/m    Subjective:    Patient ID: Adam Frost, male    DOB: [birth year], 77 y.o.   MRN: [id number]  HPI: Adam Frost is a 77 y.o. male  Chief Complaint  Patient presents with  . Medicare Wellness   Patient all in all doing well except for home checks blood pressures have been elevated. Has been taking medications faithfully. Patient also no issues with diabetes medications no low spells or high spells.  Also taking cholesterol without problems. Relevant past medical, surgical, family and social history reviewed and updated as indicated. Interim medical history since our last visit reviewed. Allergies and medications reviewed and updated.  Review of Systems  Constitutional: Negative.   HENT: Negative.   Eyes: Negative.   Respiratory: Negative.   Cardiovascular: Negative.   Gastrointestinal: Negative.   Endocrine: Negative.   Genitourinary: Negative.   Musculoskeletal: Negative.   Skin: Negative.   Allergic/Immunologic: Negative.   Neurological: Negative.   Hematological: Negative.   Psychiatric/Behavioral: Negative.     Per HPI unless specifically indicated above     Objective:    BP (!) 164/75 (BP Location: Left Arm, Patient Position: Sitting, Cuff Size: Normal)   Pulse (!) 59   Temp 97.9 F (36.6 C)   Ht 5' 9.57" (1.767 m)   Wt 184 lb (83.5 kg)   SpO2 98%   BMI 26.73 kg/m   Wt Readings from Last 3 Encounters:  04/20/18 184 lb (83.5 kg)  04/13/18 184 lb (83.5 kg)  10/11/17 180 lb (81.6 kg)    Physical Exam Constitutional:      Appearance: He is well-developed.  HENT:     Head: Normocephalic and atraumatic.     Right Ear: External ear normal.     Left Ear: External ear normal.  Eyes:     Conjunctiva/sclera: Conjunctivae normal.     Pupils: Pupils are  equal, round, and reactive to light.  Neck:     Musculoskeletal: Normal range of motion and neck supple.  Cardiovascular:     Rate and Rhythm: Normal rate and regular rhythm.     Heart sounds: Normal heart sounds.  Pulmonary:     Effort: Pulmonary effort is normal.     Breath sounds: Normal breath sounds.  Abdominal:     General: Bowel sounds are normal.     Palpations: Abdomen is soft. There is no hepatomegaly or splenomegaly.  Genitourinary:    Penis: Normal.      Rectum: Normal.     Comments: BPH changes Musculoskeletal: Normal range of motion.  Skin:    Findings: No erythema or rash.  Neurological:     Mental Status: He is alert and oriented to person, place, and time.     Deep Tendon Reflexes: Reflexes are normal and symmetric.  Psychiatric:        Behavior: Behavior normal.        Thought Content: Thought content normal.        Judgment: Judgment normal.     Results for orders placed or performed in visit on 04/13/18  CBC with Differential/Platelet  Result Value Ref Range   WBC 4.3 3.4 - 10.8 x10E3/uL   RBC 4.71 4.14 - 5.80 x10E6/uL  Hemoglobin 14.4 13.0 - 17.7 g/dL   Hematocrit 42.0 37.5 - 51.0 %   MCV 89 79 - 97 fL   MCH 30.6 26.6 - 33.0 pg   MCHC 34.3 31.5 - 35.7 g/dL   RDW 12.1 (L) 12.3 - 15.4 %   Platelets 169 150 - 450 x10E3/uL   Neutrophils 64 Not Estab. %   Lymphs 21 Not Estab. %   Monocytes 9 Not Estab. %   Eos 4 Not Estab. %   Basos 2 Not Estab. %   Neutrophils Absolute 2.8 1.4 - 7.0 x10E3/uL   Lymphocytes Absolute 0.9 0.7 - 3.1 x10E3/uL   Monocytes Absolute 0.4 0.1 - 0.9 x10E3/uL   EOS (ABSOLUTE) 0.2 0.0 - 0.4 x10E3/uL   Basophils Absolute 0.1 0.0 - 0.2 x10E3/uL   Immature Granulocytes 0 Not Estab. %   Immature Grans (Abs) 0.0 0.0 - 0.1 x10E3/uL  Comprehensive metabolic panel  Result Value Ref Range   Glucose 68 65 - 99 mg/dL   BUN 15 8 - 27 mg/dL   Creatinine, Ser 1.02 0.76 - 1.27 mg/dL   GFR calc non Af Amer 70 >59 mL/min/1.73   GFR calc  Af Amer 80 >59 mL/min/1.73   BUN/Creatinine Ratio 15 10 - 24   Sodium 142 134 - 144 mmol/L   Potassium 3.9 3.5 - 5.2 mmol/L   Chloride 100 96 - 106 mmol/L   CO2 24 20 - 29 mmol/L   Calcium 9.7 8.6 - 10.2 mg/dL   Total Protein 6.7 6.0 - 8.5 g/dL   Albumin 4.3 3.5 - 4.8 g/dL   Globulin, Total 2.4 1.5 - 4.5 g/dL   Albumin/Globulin Ratio 1.8 1.2 - 2.2   Bilirubin Total 0.8 0.0 - 1.2 mg/dL   Alkaline Phosphatase 76 39 - 117 IU/L   AST 14 0 - 40 IU/L   ALT 10 0 - 44 IU/L  Lipid panel  Result Value Ref Range   Cholesterol, Total 164 100 - 199 mg/dL   Triglycerides 72 0 - 149 mg/dL   HDL 49 >39 mg/dL   VLDL Cholesterol Cal 14 5 - 40 mg/dL   LDL Calculated 101 (H) 0 - 99 mg/dL   Chol/HDL Ratio 3.3 0.0 - 5.0 ratio  Hemoglobin A1c  Result Value Ref Range   Hgb A1c MFr Bld 6.3 (H) 4.8 - 5.6 %   Est. average glucose Bld gHb Est-mCnc 134 mg/dL  PSA  Result Value Ref Range   Prostate Specific Ag, Serum 0.4 0.0 - 4.0 ng/mL      Assessment & Plan:   Problem List Items Addressed This Visit      Cardiovascular and Mediastinum   Essential hypertension    Hypertension poor control here and at home will gently increase medications by using HCTZ 12.5 once a day discuss lightheaded dizziness following with patient will recheck blood pressure this winter.      Relevant Medications   quinapril (ACCUPRIL) 40 MG tablet   amLODipine (NORVASC) 5 MG tablet   atorvastatin (LIPITOR) 40 MG tablet   hydrochlorothiazide (HYDRODIURIL) 12.5 MG tablet     Endocrine   Diabetes mellitus associated with hormonal etiology ([place])    .Marland KitchenThe current medical regimen is effective;  continue present plan and medications.       Relevant Medications   glipiZIDE-metformin (METAGLIP) 2.5-500 MG tablet   quinapril (ACCUPRIL) 40 MG tablet   sitaGLIPtin (JANUVIA) 50 MG tablet   atorvastatin (LIPITOR) 40 MG tablet     Genitourinary   BPH (  benign prostatic hyperplasia)    The current medical regimen is effective;   continue present plan and medications.         Other   Hyperlipidemia    Discuss hyper hypercholesterol and medication will change from simvastatin to atorvastatin      Relevant Medications   quinapril (ACCUPRIL) 40 MG tablet   amLODipine (NORVASC) 5 MG tablet   atorvastatin (LIPITOR) 40 MG tablet   hydrochlorothiazide (HYDRODIURIL) 12.5 MG tablet   Advanced care planning/counseling discussion    A voluntary discussion about advanced care planning including explanation and discussion of advanced directives was extentively discussed with the patient.  Explained about the healthcare proxy and living will was reviewed and packet with forms with expiration of how to fill them out was given.  Time spent: Encounter 16+ min individuals present: Patient          Follow up plan: Return in about 3 months (around 07/20/2018) for BMP BP check,  Lipids, ALT, AST, BMP.

## 2018-05-19 ENCOUNTER — Other Ambulatory Visit: Payer: Self-pay | Admitting: Family Medicine

## 2018-05-19 DIAGNOSIS — E7800 Pure hypercholesterolemia, unspecified: Secondary | ICD-10-CM

## 2018-08-01 ENCOUNTER — Ambulatory Visit (INDEPENDENT_AMBULATORY_CARE_PROVIDER_SITE_OTHER): Payer: Medicare Other | Admitting: Family Medicine

## 2018-08-01 ENCOUNTER — Encounter: Payer: Self-pay | Admitting: Family Medicine

## 2018-08-01 ENCOUNTER — Other Ambulatory Visit: Payer: Self-pay

## 2018-08-01 DIAGNOSIS — E1169 Type 2 diabetes mellitus with other specified complication: Secondary | ICD-10-CM

## 2018-08-01 DIAGNOSIS — I10 Essential (primary) hypertension: Secondary | ICD-10-CM | POA: Diagnosis not present

## 2018-08-01 DIAGNOSIS — E782 Mixed hyperlipidemia: Secondary | ICD-10-CM

## 2018-08-01 MED ORDER — HYDROCHLOROTHIAZIDE 12.5 MG PO TABS: 12.5000 mg | ORAL_TABLET | Freq: Every day | ORAL | 1 refills | Status: DC

## 2018-08-01 NOTE — Assessment & Plan Note (Signed)
The current medical regimen is effective;  continue present plan and medications.  

## 2018-08-01 NOTE — Progress Notes (Signed)
BP 136/61 Comment: pt reported  Pulse (!) 54 Comment: patient reported   Subjective:    Patient ID: Adam Frost, male    DOB: [birth year], 78 y.o.   MRN: [id number]  HPI: Adam Frost is a 78 y.o. male  Chief Complaint  Patient presents with  . Diabetes  . Hyperlipidemia  . Hypertension  Telemedicine using audio and telecommunications for a synchronous communication visit. Today's visit due to COVID-19 isolation precautions I connected with Adam Frost and verified that I am speaking with the correct person using two identifiers.   I discussed the limitations, risks, security and privacy concerns of performing an evaluation and management service by telephone and the availability of in person appointments. I also discussed with the patient that there may be a patient responsible charge related to this service. The patient expressed understanding and agreed to proceed. The patient's location is home. I am at home.  Patient all in all doing well checking home blood pressures and doing well with no problems.  Has good control is taking the new medication hydrochlorothiazide without problems or side effects. Blood sugar doing well with no complaints of low blood sugar spells or feeling bad. Atorvastatin working well for cholesterol also without problems. Has been able to work vigorously in his garden without problems.  Relevant past medical, surgical, family and social history reviewed and updated as indicated. Interim medical history since our last visit reviewed. Allergies and medications reviewed and updated.  Review of Systems  Constitutional: Negative.   Respiratory: Negative.   Cardiovascular: Negative.     Per HPI unless specifically indicated above     Objective:    BP 136/61 Comment: pt reported  Pulse (!) 54 Comment: patient reported  Wt Readings from Last 3 Encounters:  04/20/18 184 lb (83.5 kg)  04/13/18 184 lb (83.5 kg)  10/11/17 180 lb (81.6 kg)     Physical Exam none Results for orders placed or performed in visit on 04/13/18  CBC with Differential/Platelet  Result Value Ref Range   WBC 4.3 3.4 - 10.8 x10E3/uL   RBC 4.71 4.14 - 5.80 x10E6/uL   Hemoglobin 14.4 13.0 - 17.7 g/dL   Hematocrit 42.0 37.5 - 51.0 %   MCV 89 79 - 97 fL   MCH 30.6 26.6 - 33.0 pg   MCHC 34.3 31.5 - 35.7 g/dL   RDW 12.1 (L) 12.3 - 15.4 %   Platelets 169 150 - 450 x10E3/uL   Neutrophils 64 Not Estab. %   Lymphs 21 Not Estab. %   Monocytes 9 Not Estab. %   Eos 4 Not Estab. %   Basos 2 Not Estab. %   Neutrophils Absolute 2.8 1.4 - 7.0 x10E3/uL   Lymphocytes Absolute 0.9 0.7 - 3.1 x10E3/uL   Monocytes Absolute 0.4 0.1 - 0.9 x10E3/uL   EOS (ABSOLUTE) 0.2 0.0 - 0.4 x10E3/uL   Basophils Absolute 0.1 0.0 - 0.2 x10E3/uL   Immature Granulocytes 0 Not Estab. %   Immature Grans (Abs) 0.0 0.0 - 0.1 x10E3/uL  Comprehensive metabolic panel  Result Value Ref Range   Glucose 68 65 - 99 mg/dL   BUN 15 8 - 27 mg/dL   Creatinine, Ser 1.02 0.76 - 1.27 mg/dL   GFR calc non Af Amer 70 >59 mL/min/1.73   GFR calc Af Amer 80 >59 mL/min/1.73   BUN/Creatinine Ratio 15 10 - 24   Sodium 142 134 - 144 mmol/L   Potassium 3.9 3.5 - 5.2 mmol/L  Chloride 100 96 - 106 mmol/L   CO2 24 20 - 29 mmol/L   Calcium 9.7 8.6 - 10.2 mg/dL   Total Protein 6.7 6.0 - 8.5 g/dL   Albumin 4.3 3.5 - 4.8 g/dL   Globulin, Total 2.4 1.5 - 4.5 g/dL   Albumin/Globulin Ratio 1.8 1.2 - 2.2   Bilirubin Total 0.8 0.0 - 1.2 mg/dL   Alkaline Phosphatase 76 39 - 117 IU/L   AST 14 0 - 40 IU/L   ALT 10 0 - 44 IU/L  Lipid panel  Result Value Ref Range   Cholesterol, Total 164 100 - 199 mg/dL   Triglycerides 72 0 - 149 mg/dL   HDL 49 >39 mg/dL   VLDL Cholesterol Cal 14 5 - 40 mg/dL   LDL Calculated 101 (H) 0 - 99 mg/dL   Chol/HDL Ratio 3.3 0.0 - 5.0 ratio  Hemoglobin A1c  Result Value Ref Range   Hgb A1c MFr Bld 6.3 (H) 4.8 - 5.6 %   Est. average glucose Bld gHb Est-mCnc 134 mg/dL  PSA  Result  Value Ref Range   Prostate Specific Ag, Serum 0.4 0.0 - 4.0 ng/mL      Assessment & Plan:   Problem List Items Addressed This Visit      Cardiovascular and Mediastinum   Essential hypertension    The current medical regimen is effective;  continue present plan and medications.       Relevant Medications   hydrochlorothiazide (HYDRODIURIL) 12.5 MG tablet     Endocrine   Diabetes mellitus associated with hormonal etiology ([place])    The current medical regimen is effective;  continue present plan and medications.         Other   Hyperlipidemia    The current medical regimen is effective;  continue present plan and medications.       Relevant Medications   hydrochlorothiazide (HYDRODIURIL) 12.5 MG tablet      I discussed the assessment and treatment plan with the patient. The patient was provided an opportunity to ask questions and all were answered. The patient agreed with the plan and demonstrated an understanding of the instructions.   The patient was advised to call back or seek an in-person evaluation if the symptoms worsen or if the condition fails to improve as anticipated.   I provided 21+ minutes of time during this encounter. Follow up plan: Return in about 3 months (around 10/31/2018) for Hemoglobin A1c, BMP,  Lipids, ALT, AST.

## 2018-10-02 ENCOUNTER — Emergency Department (HOSPITAL_COMMUNITY): Payer: Medicare Other

## 2018-10-02 ENCOUNTER — Emergency Department (HOSPITAL_COMMUNITY)
Admission: EM | Admit: 2018-10-02 | Discharge: 2018-10-02 | Disposition: A | Discharge status: Home / Self Care | Payer: Medicare Other | Attending: Emergency Medicine | Admitting: Emergency Medicine

## 2018-10-02 ENCOUNTER — Encounter (HOSPITAL_COMMUNITY): Payer: Self-pay | Admitting: Emergency Medicine

## 2018-10-02 ENCOUNTER — Other Ambulatory Visit: Payer: Self-pay

## 2018-10-02 DIAGNOSIS — S40022A Contusion of left upper arm, initial encounter: Secondary | ICD-10-CM | POA: Insufficient documentation

## 2018-10-02 DIAGNOSIS — S0502XA Injury of conjunctiva and corneal abrasion without foreign body, left eye, initial encounter: Secondary | ICD-10-CM | POA: Diagnosis not present

## 2018-10-02 DIAGNOSIS — S40811A Abrasion of right upper arm, initial encounter: Secondary | ICD-10-CM | POA: Diagnosis not present

## 2018-10-02 DIAGNOSIS — M542 Cervicalgia: Secondary | ICD-10-CM | POA: Insufficient documentation

## 2018-10-02 DIAGNOSIS — Z23 Encounter for immunization: Secondary | ICD-10-CM | POA: Diagnosis not present

## 2018-10-02 DIAGNOSIS — S41111A Laceration without foreign body of right upper arm, initial encounter: Secondary | ICD-10-CM | POA: Diagnosis not present

## 2018-10-02 DIAGNOSIS — S0101XA Laceration without foreign body of scalp, initial encounter: Secondary | ICD-10-CM | POA: Insufficient documentation

## 2018-10-02 DIAGNOSIS — S41112A Laceration without foreign body of left upper arm, initial encounter: Secondary | ICD-10-CM | POA: Diagnosis not present

## 2018-10-02 DIAGNOSIS — I10 Essential (primary) hypertension: Secondary | ICD-10-CM | POA: Insufficient documentation

## 2018-10-02 DIAGNOSIS — Y999 Unspecified external cause status: Secondary | ICD-10-CM | POA: Diagnosis not present

## 2018-10-02 DIAGNOSIS — E119 Type 2 diabetes mellitus without complications: Secondary | ICD-10-CM | POA: Diagnosis not present

## 2018-10-02 DIAGNOSIS — T07XXXA Unspecified multiple injuries, initial encounter: Secondary | ICD-10-CM | POA: Diagnosis present

## 2018-10-02 DIAGNOSIS — Y939 Activity, unspecified: Secondary | ICD-10-CM | POA: Insufficient documentation

## 2018-10-02 DIAGNOSIS — Y929 Unspecified place or not applicable: Secondary | ICD-10-CM | POA: Insufficient documentation

## 2018-10-02 DIAGNOSIS — S40812A Abrasion of left upper arm, initial encounter: Secondary | ICD-10-CM | POA: Insufficient documentation

## 2018-10-02 LAB — COMPREHENSIVE METABOLIC PANEL
ALT: 35 U/L (ref 0–44)
AST: 32 U/L (ref 15–41)
Albumin: 4 g/dL (ref 3.5–5.0)
Alkaline Phosphatase: 100 U/L (ref 38–126)
Anion gap: 10 (ref 5–15)
BUN: 25 mg/dL — ABNORMAL HIGH (ref 8–23)
CO2: 25 mmol/L (ref 22–32)
Calcium: 9.4 mg/dL (ref 8.9–10.3)
Chloride: 104 mmol/L (ref 98–111)
Creatinine, Ser: 1.41 mg/dL — ABNORMAL HIGH (ref 0.61–1.24)
GFR calc Af Amer: 55 mL/min — ABNORMAL LOW (ref >60)
GFR calc non Af Amer: 47 mL/min — ABNORMAL LOW (ref >60)
Glucose, Bld: 184 mg/dL — ABNORMAL HIGH (ref 70–99)
Potassium: 3.5 mmol/L (ref 3.5–5.1)
Sodium: 139 mmol/L (ref 135–145)
Total Bilirubin: 1.2 mg/dL (ref 0.3–1.2)
Total Protein: 7.3 g/dL (ref 6.5–8.1)

## 2018-10-02 LAB — CBC WITH DIFFERENTIAL/PLATELET
Abs Immature Granulocytes: 0.11 10*3/uL — ABNORMAL HIGH (ref 0.00–0.07)
Basophils Absolute: 0.1 10*3/uL (ref 0.0–0.1)
Basophils Relative: 0 %
Eosinophils Absolute: 0.2 10*3/uL (ref 0.0–0.5)
Eosinophils Relative: 2 %
HCT: 40.7 % (ref 39.0–52.0)
Hemoglobin: 13.5 g/dL (ref 13.0–17.0)
Immature Granulocytes: 1 %
Lymphocytes Relative: 4 %
Lymphs Abs: 0.6 10*3/uL — ABNORMAL LOW (ref 0.7–4.0)
MCH: 31 pg (ref 26.0–34.0)
MCHC: 33.2 g/dL (ref 30.0–36.0)
MCV: 93.3 fL (ref 80.0–100.0)
Monocytes Absolute: 0.8 10*3/uL (ref 0.1–1.0)
Monocytes Relative: 6 %
Neutro Abs: 12.2 10*3/uL — ABNORMAL HIGH (ref 1.7–7.7)
Neutrophils Relative %: 87 %
Platelets: 170 10*3/uL (ref 150–400)
RBC: 4.36 MIL/uL (ref 4.22–5.81)
RDW: 12.6 % (ref 11.5–15.5)
WBC: 14 10*3/uL — ABNORMAL HIGH (ref 4.0–10.5)
nRBC: 0 % (ref 0.0–0.2)

## 2018-10-02 MED ORDER — ACETAMINOPHEN 160 MG/5ML PO ELIX: 500.0000 mg | ORAL_SOLUTION | Freq: Four times a day (QID) | ORAL | 0 refills | Status: DC | PRN

## 2018-10-02 MED ORDER — FLUORESCEIN SODIUM 1 MG OP STRP
1.0000 | ORAL_STRIP | Freq: Once | OPHTHALMIC | Status: AC
  Administered 2018-10-02: 21:00:00 1 via OPHTHALMIC
  Filled 2018-10-02: qty 1

## 2018-10-02 MED ORDER — TETRACAINE HCL 0.5 % OP SOLN
2.0000 [drp] | Freq: Once | OPHTHALMIC | Status: AC
  Administered 2018-10-02: 2 [drp] via OPHTHALMIC
  Filled 2018-10-02: qty 4

## 2018-10-02 MED ORDER — ERYTHROMYCIN 5 MG/GM OP OINT: TOPICAL_OINTMENT | OPHTHALMIC | 0 refills | Status: DC

## 2018-10-02 MED ORDER — IBUPROFEN 100 MG/5ML PO SUSP: 400.0000 mg | Freq: Four times a day (QID) | ORAL | 0 refills | Status: DC | PRN

## 2018-10-02 MED ORDER — ERYTHROMYCIN 5 MG/GM OP OINT
1.0000 "application " | TOPICAL_OINTMENT | Freq: Once | OPHTHALMIC | Status: AC
  Administered 2018-10-02: 1 via OPHTHALMIC
  Filled 2018-10-02: qty 3.5

## 2018-10-02 MED ORDER — TETANUS-DIPHTH-ACELL PERTUSSIS 5-2.5-18.5 LF-MCG/0.5 IM SUSP
0.5000 mL | Freq: Once | INTRAMUSCULAR | Status: AC
  Administered 2018-10-02: 0.5 mL via INTRAMUSCULAR
  Filled 2018-10-02: qty 0.5

## 2018-10-02 MED ORDER — BACITRACIN ZINC 500 UNIT/GM EX OINT
TOPICAL_OINTMENT | Freq: Once | CUTANEOUS | Status: AC
  Administered 2018-10-02: 1 via TOPICAL
  Filled 2018-10-02: qty 0.9
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
## 2018-10-02 NOTE — ED Notes (Signed)
Pt's wife Suleiman Finigan would like to be updated. Her number is [phone]. Pt spoke with daughter at about 21.

## 2018-10-09 ENCOUNTER — Telehealth: Payer: Self-pay | Admitting: Family Medicine

## 2018-10-09 NOTE — Telephone Encounter (Signed)
Called pt to go over script screening for covid-19 no answer, left vm  °

## 2018-10-10 ENCOUNTER — Ambulatory Visit (INDEPENDENT_AMBULATORY_CARE_PROVIDER_SITE_OTHER): Payer: Medicare Other | Admitting: Nurse Practitioner

## 2018-10-10 ENCOUNTER — Encounter: Payer: Self-pay | Admitting: Nurse Practitioner

## 2018-10-10 ENCOUNTER — Other Ambulatory Visit: Payer: Self-pay

## 2018-10-10 VITALS — BP 137/69 | HR 75 | Temp 98.2°F

## 2018-10-10 DIAGNOSIS — M542 Cervicalgia: Secondary | ICD-10-CM | POA: Insufficient documentation

## 2018-10-10 DIAGNOSIS — E1169 Type 2 diabetes mellitus with other specified complication: Secondary | ICD-10-CM

## 2018-10-10 MED ORDER — MUPIROCIN 2 % EX OINT: 1.0000 "application " | TOPICAL_OINTMENT | Freq: Two times a day (BID) | CUTANEOUS | 0 refills | Status: DC

## 2018-10-10 NOTE — Assessment & Plan Note (Signed)
Chronic, stable.  Continue current medication regimen and obtain A1C today, adjust medications as needed.  Return in 3 months for f/u.

## 2018-10-10 NOTE — Assessment & Plan Note (Signed)
Acute and reports improving with negative imaging in ER.  Recommend using Tylenol as needed, along with Aspercreme and ice.  Minimally use Ibuprofen due to HTN and age.  Return for worsening or continued issues.
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
## 2018-10-11 LAB — CBC WITH DIFFERENTIAL/PLATELET
Basophils Absolute: 0.1 10*3/uL (ref 0.0–0.2)
Basos: 1 %
EOS (ABSOLUTE): 0.2 10*3/uL (ref 0.0–0.4)
Eos: 4 %
Hematocrit: 38.1 % (ref 37.5–51.0)
Hemoglobin: 13.1 g/dL (ref 13.0–17.7)
Immature Grans (Abs): 0 10*3/uL (ref 0.0–0.1)
Immature Granulocytes: 1 %
Lymphocytes Absolute: 0.8 10*3/uL (ref 0.7–3.1)
Lymphs: 12 %
MCH: 31.6 pg (ref 26.6–33.0)
MCHC: 34.4 g/dL (ref 31.5–35.7)
MCV: 92 fL (ref 79–97)
Monocytes Absolute: 0.6 10*3/uL (ref 0.1–0.9)
Monocytes: 9 %
Neutrophils Absolute: 4.7 10*3/uL (ref 1.4–7.0)
Neutrophils: 73 %
Platelets: 206 10*3/uL (ref 150–450)
RBC: 4.14 x10E6/uL (ref 4.14–5.80)
RDW: 11.9 % (ref 11.6–15.4)
WBC: 6.5 10*3/uL (ref 3.4–10.8)

## 2018-10-11 LAB — BASIC METABOLIC PANEL
BUN/Creatinine Ratio: 20 (ref 10–24)
BUN: 21 mg/dL (ref 8–27)
CO2: 24 mmol/L (ref 20–29)
Calcium: 9.4 mg/dL (ref 8.6–10.2)
Chloride: 97 mmol/L (ref 96–106)
Creatinine, Ser: 1.03 mg/dL (ref 0.76–1.27)
GFR calc Af Amer: 79 mL/min/{1.73_m2} (ref >59)
GFR calc non Af Amer: 69 mL/min/{1.73_m2} (ref >59)
Glucose: 243 mg/dL — ABNORMAL HIGH (ref 65–99)
Potassium: 4.1 mmol/L (ref 3.5–5.2)
Sodium: 137 mmol/L (ref 134–144)

## 2018-10-11 LAB — HEMOGLOBIN A1C
Est. average glucose Bld gHb Est-mCnc: 140 mg/dL
Hgb A1c MFr Bld: 6.5 % — ABNORMAL HIGH (ref 4.8–5.6)

## 2018-11-20 ENCOUNTER — Other Ambulatory Visit: Payer: Self-pay

## 2018-11-20 ENCOUNTER — Ambulatory Visit (INDEPENDENT_AMBULATORY_CARE_PROVIDER_SITE_OTHER): Payer: Medicare Other | Admitting: Family Medicine

## 2018-11-20 ENCOUNTER — Encounter: Payer: Self-pay | Admitting: Family Medicine

## 2018-11-20 VITALS — BP 120/62

## 2018-11-20 DIAGNOSIS — M542 Cervicalgia: Secondary | ICD-10-CM

## 2018-11-20 DIAGNOSIS — E1169 Type 2 diabetes mellitus with other specified complication: Secondary | ICD-10-CM

## 2018-11-20 DIAGNOSIS — I10 Essential (primary) hypertension: Secondary | ICD-10-CM

## 2018-11-20 NOTE — Assessment & Plan Note (Signed)
The current medical regimen is effective;  continue present plan and medications.  
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
## 2018-11-29 ENCOUNTER — Ambulatory Visit: Payer: Medicare Other | Attending: Family Medicine

## 2018-11-29 ENCOUNTER — Other Ambulatory Visit: Payer: Self-pay

## 2018-11-29 DIAGNOSIS — M542 Cervicalgia: Secondary | ICD-10-CM | POA: Diagnosis present

## 2018-11-29 NOTE — Patient Instructions (Addendum)
Medbridge Access Code: [id number]  Seated scapular retraction 10x3 with 5 second holds for 3x/day  Supine cervical retraction with towel 10x3 with 5 seconds
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
## 2018-12-06 ENCOUNTER — Ambulatory Visit: Payer: Medicare Other | Attending: Family Medicine

## 2018-12-06 ENCOUNTER — Other Ambulatory Visit: Payer: Self-pay

## 2018-12-06 DIAGNOSIS — M542 Cervicalgia: Secondary | ICD-10-CM | POA: Insufficient documentation

## 2018-12-06 NOTE — Patient Instructions (Signed)
Seated cervical flexion isometrics   Perform a chin tuck (get away from the "stinky sock")   Squeeze your shoulder blades together but keep them down   Thumbs underneath your chin    Gently tilt your head against your thumbs while your thumbs are not letting your head move     Hold for 5 seconds   Repeat 10 times,     Perform at least 3 sets daily.
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
## 2018-12-07 ENCOUNTER — Ambulatory Visit: Payer: Medicare Other

## 2018-12-11 ENCOUNTER — Other Ambulatory Visit: Payer: Self-pay

## 2018-12-11 ENCOUNTER — Ambulatory Visit: Payer: Medicare Other

## 2018-12-11 DIAGNOSIS — M542 Cervicalgia: Secondary | ICD-10-CM

## 2018-12-11 NOTE — Patient Instructions (Signed)
Sit on a chair  Scoot your rear end all the way back   Place a towel roll at your low back   Lean back against the towel roll
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
## 2018-12-14 ENCOUNTER — Other Ambulatory Visit: Payer: Self-pay

## 2018-12-14 ENCOUNTER — Ambulatory Visit: Payer: Medicare Other

## 2018-12-14 DIAGNOSIS — M542 Cervicalgia: Secondary | ICD-10-CM
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
## 2018-12-14 NOTE — Patient Instructions (Signed)
Pt was recommended to press his tongue at the roof of his mouth to help decrease cervical paraspinal muscle tension,   Pt was also recommended to try using a heating pad posterior neck and B UT muscles x 15 min to help decrease tension.   Pt demonstrated and verbalized understanding.

## 2018-12-18 ENCOUNTER — Ambulatory Visit: Payer: Medicare Other

## 2018-12-21 ENCOUNTER — Other Ambulatory Visit: Payer: Self-pay

## 2018-12-21 ENCOUNTER — Ambulatory Visit: Payer: Medicare Other

## 2018-12-21 DIAGNOSIS — M542 Cervicalgia: Secondary | ICD-10-CM | POA: Diagnosis not present
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
## 2018-12-21 NOTE — Patient Instructions (Addendum)
  Access Code: [email or web address]  URL: [email or web address]  Date: 12/21/2018  Prepared by: Joneen Boers   Exercises Seated Scapular Retraction - 10 reps - 3 sets - 5 seconds hold - 3x daily - 7x weekly Supine Cervical Retraction with Towel - 10 reps - 3 sets - 5 seconds hold - 1x daily - 7x weekly Single Arm Shoulder Extension with Resistance - 10 reps - 3 sets - 5 seconds hold - 1x daily - 7x weekly Seated Thoracic Lumbar Extension - 10 reps - 3 sets - 5 secons hold - 2x daily - 7x weekly

## 2018-12-25 ENCOUNTER — Other Ambulatory Visit: Payer: Self-pay

## 2018-12-25 ENCOUNTER — Ambulatory Visit: Payer: Medicare Other

## 2018-12-25 DIAGNOSIS — M542 Cervicalgia: Secondary | ICD-10-CM | POA: Diagnosis not present
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
## 2018-12-28 ENCOUNTER — Other Ambulatory Visit: Payer: Self-pay

## 2018-12-28 ENCOUNTER — Ambulatory Visit: Payer: Medicare Other

## 2018-12-28 DIAGNOSIS — M542 Cervicalgia: Secondary | ICD-10-CM | POA: Diagnosis not present
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
## 2018-12-28 NOTE — Patient Instructions (Addendum)
Access Code: [email or web address]  URL: [email or web address]  Date: 12/28/2018  Prepared by: Joneen Boers   Exercises Seated Scapular Retraction - 10 reps - 3 sets - 5 seconds hold - 3x daily - 7x weekly Supine Cervical Retraction with Towel - 10 reps - 3 sets - 5 seconds hold - 1x daily - 7x weekly Single Arm Shoulder Extension with Resistance - 10 reps - 3 sets - 5 seconds hold - 1x daily - 7x weekly Seated Thoracic Lumbar Extension - 10 reps - 3 sets - 5 secons hold - 2x daily - 7x weekly Seated Upper Trapezius Stretch - 2 reps - 1 sets - 30 hold - 1x daily - 7x weekly Seated Levator Scapulae Stretch - 2 reps - 1 sets - 30 hold - 1x daily - 7x weekly Supine Shoulder Flexion Extension Full Range AROM - 10 reps - 3 sets - 5 seconds hold - 1x daily - 7x weekly Supine Segmental Cervical Flexion - 10 reps - 3 sets - 1x daily - 7x weekly    Also gave lower trap raise at wall R UE and pressing his tongue at the roof of his mouth (throughout the day)  as part of his HEP. Pt demonstrated and verbalized understanding.

## 2019-01-01 ENCOUNTER — Other Ambulatory Visit: Payer: Self-pay

## 2019-01-01 ENCOUNTER — Ambulatory Visit: Payer: Medicare Other

## 2019-01-01 DIAGNOSIS — M542 Cervicalgia: Secondary | ICD-10-CM | POA: Diagnosis not present
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
## 2019-01-10 ENCOUNTER — Other Ambulatory Visit: Payer: Self-pay

## 2019-01-10 ENCOUNTER — Ambulatory Visit: Payer: Medicare Other | Attending: Family Medicine

## 2019-01-10 DIAGNOSIS — M542 Cervicalgia: Secondary | ICD-10-CM | POA: Insufficient documentation
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
## 2019-01-10 NOTE — Patient Instructions (Signed)
Access Code: [email or web address]  URL: [email or web address]  Date: 01/10/2019  Prepared by: Joneen Boers   Exercises Seated Scapular Retraction - 10 reps - 3 sets - 5 seconds hold - 3x daily - 7x weekly Supine Cervical Retraction with Towel - 10 reps - 3 sets - 5 seconds hold - 1x daily - 7x weekly Single Arm Shoulder Extension with Resistance - 10 reps - 3 sets - 5 seconds hold - 1x daily - 7x weekly Seated Thoracic Lumbar Extension - 10 reps - 3 sets - 5 secons hold - 2x daily - 7x weekly Seated Upper Trapezius Stretch - 2 reps - 1 sets - 30 hold - 1x daily - 7x weekly Seated Levator Scapulae Stretch - 2 reps - 1 sets - 30 hold - 1x daily - 7x weekly Supine Shoulder Flexion Extension Full Range AROM - 10 reps - 3 sets - 5 seconds hold - 1x daily - 7x weekly Supine Segmental Cervical Flexion - 10 reps - 3 sets - 1x daily - 7x weekly Single Arm Lower Trap Retraction and Depression - 10 reps - 2 sets - 5 seconds hold - 1x daily - 7x weekly

## 2019-02-15 ENCOUNTER — Other Ambulatory Visit: Payer: Self-pay

## 2019-02-15 ENCOUNTER — Ambulatory Visit: Payer: Medicare Other | Attending: Family Medicine

## 2019-02-15 DIAGNOSIS — M542 Cervicalgia: Secondary | ICD-10-CM | POA: Diagnosis present

## 2019-02-15 NOTE — Patient Instructions (Signed)
Upgraded shoulder extension with scapular retraction to blue band
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
## 2019-03-22 ENCOUNTER — Ambulatory Visit: Payer: Medicare Other | Attending: Family Medicine

## 2019-03-22 ENCOUNTER — Other Ambulatory Visit: Payer: Self-pay

## 2019-03-22 DIAGNOSIS — M542 Cervicalgia: Secondary | ICD-10-CM | POA: Diagnosis present
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
## 2019-04-09 ENCOUNTER — Other Ambulatory Visit: Payer: Self-pay

## 2019-04-09 MED ORDER — HYDROCHLOROTHIAZIDE 12.5 MG PO TABS: 12.5000 mg | ORAL_TABLET | Freq: Every day | ORAL | 0 refills | Status: DC

## 2019-04-09 NOTE — Telephone Encounter (Signed)
Patient last seen 11/20/18 and has appointment 05/25/19.

## 2019-04-11 LAB — HM DIABETES EYE EXAM

## 2019-04-13 ENCOUNTER — Other Ambulatory Visit: Payer: Self-pay

## 2019-04-13 DIAGNOSIS — Z20822 Contact with and (suspected) exposure to covid-19: Secondary | ICD-10-CM

## 2019-04-14 LAB — NOVEL CORONAVIRUS, NAA: SARS-CoV-2, NAA: NOT DETECTED

## 2019-04-16 ENCOUNTER — Ambulatory Visit (INDEPENDENT_AMBULATORY_CARE_PROVIDER_SITE_OTHER): Payer: Medicare Other

## 2019-04-16 ENCOUNTER — Other Ambulatory Visit: Payer: Self-pay

## 2019-04-16 VITALS — BP 110/68 | HR 61 | Temp 98.5°F | Resp 15 | Ht 71.0 in | Wt 176.4 lb

## 2019-04-16 DIAGNOSIS — Z0000 Encounter for general adult medical examination without abnormal findings: Secondary | ICD-10-CM | POA: Diagnosis not present

## 2019-04-16 NOTE — Progress Notes (Signed)
Subjective:   Adam Frost is a 78 y.o. male who presents for Medicare Annual/Subsequent preventive examination.  Review of Systems:   Cardiac Risk Factors include: advanced age (>51men, >59 women);dyslipidemia;diabetes mellitus;hypertension;male gender     Objective:    Vitals: BP 110/68 (BP Location: Left Arm, Patient Position: Sitting, Cuff Size: Normal)   Pulse 61   Temp 98.5 F (36.9 C) (Oral)   Resp 15   Ht 5\' 11"  (1.803 m)   Wt 176 lb 6.4 oz (80 kg)   SpO2 98%   BMI 24.60 kg/m   Body mass index is 24.6 kg/m.  Advanced Directives 04/16/2019 11/29/2018 10/02/2018 04/13/2018 03/17/2017  Does Patient Have a Medical Advance Directive? Yes Yes No;Yes Yes Yes  Type of Advance Directive Living will;Healthcare Power of [place];Living will;Out of facility DNR (pink MOST or yellow form) - Living will [place];Living will  Does patient want to make changes to medical advance directive? - No - Patient declined - No - Patient declined -  Copy of [place] in Chart? No - copy requested - - - No - copy requested    Tobacco Social History   Tobacco Use  Smoking Status Never Smoker  Smokeless Tobacco Never Used     Counseling given: Not Answered   Clinical Intake:  Pre-visit preparation completed: Yes  Pain : No/denies pain     Nutritional Status: BMI of 19-24  Normal Nutritional Risks: None Diabetes: Yes CBG done?: No Did pt. bring in CBG monitor from home?: No  How often do you need to have someone help you when you read instructions, pamphlets, or other written materials from your doctor or pharmacy?: 1 - Never  Interpreter Needed?: No  Information entered by :: Berdell Hostetler,LPN  Past Medical History:  Diagnosis Date  . Abdominal hernia   . Diabetes mellitus without complication ([place])   . Hemorrhoid   . Hyperlipidemia   . Hypertension    Past Surgical History:  Procedure Laterality Date    . COLONOSCOPY    . COLONOSCOPY WITH PROPOFOL N/A 04/17/2015   Procedure: COLONOSCOPY WITH PROPOFOL;  Surgeon: Hulen Luster, MD;  Location: [facility] ENDOSCOPY;  Service: Gastroenterology;  Laterality: N/A;  . HERNIA REPAIR    . HYDROCELE EXCISION / REPAIR     Family History  Problem Relation Age of Onset  . Heart disease Mother   . Colon cancer Father   . Colon cancer Brother    Social History   Socioeconomic History  . Marital status: Married    Spouse name: Not on file  . Number of children: Not on file  . Years of education: 39  . Highest education level: 12th grade  Occupational History  . Occupation: retired   Tobacco Use  . Smoking status: Never Smoker  . Smokeless tobacco: Never Used  Substance and Sexual Activity  . Alcohol use: No  . Drug use: No  . Sexual activity: Not on file  Other Topics Concern  . Not on file  Social History Narrative  . Not on file   Social Determinants of Health   Financial Resource Strain:   . Difficulty of Paying Living Expenses: Not on file  Food Insecurity:   . Worried About Charity fundraiser in the Last Year: Not on file  . Ran Out of Food in the Last Year: Not on file  Transportation Needs:   . Lack of Transportation (Medical): Not on file  .  Lack of Transportation (Non-Medical): Not on file  Physical Activity:   . Days of Exercise per Week: Not on file  . Minutes of Exercise per Session: Not on file  Stress:   . Feeling of Stress : Not on file  Social Connections:   . Frequency of Communication with Friends and Family: Not on file  . Frequency of Social Gatherings with Friends and Family: Not on file  . Attends Religious Services: Not on file  . Active Member of Clubs or Organizations: Not on file  . Attends Archivist Meetings: Not on file  . Marital Status: Not on file    Outpatient Encounter Medications as of 04/16/2019  Medication Sig  . amLODipine (NORVASC) 5 MG tablet Take 1 tablet (5 mg total) by mouth  daily.  Marland Kitchen aspirin EC 81 MG tablet Take 81 mg by mouth daily.  Marland Kitchen atorvastatin (LIPITOR) 40 MG tablet Take 1 tablet (40 mg total) by mouth daily.  . calcium citrate (CALCITRATE - DOSED IN MG ELEMENTAL CALCIUM) 950 MG tablet Take 1,000 mg of elemental calcium by mouth 2 (two) times daily. With vitamin d 3 1000 iu  500mg  calcium  . glipiZIDE-metformin (METAGLIP) 2.5-500 MG tablet Take 2 tablets by mouth 2 (two) times daily before a meal.  . hydrochlorothiazide (HYDRODIURIL) 12.5 MG tablet Take 1 tablet (12.5 mg total) by mouth daily.  . quinapril (ACCUPRIL) 40 MG tablet Take 1 tablet (40 mg total) by mouth at bedtime.  . sitaGLIPtin (JANUVIA) 50 MG tablet Take 1 tablet (50 mg total) by mouth daily.  Marland Kitchen triamcinolone cream (KENALOG) 0.1 % Apply 1 application topically 2 (two) times daily as needed (rash).   . [DISCONTINUED] acetaminophen (TYLENOL) 160 MG/5ML elixir Take 15.6 mLs (500 mg total) by mouth every 6 (six) hours as needed for pain. (Patient not taking: Reported on 11/29/2018)  . [DISCONTINUED] cholecalciferol (VITAMIN D3) 25 MCG (1000 UT) tablet Take 1,000 Units by mouth daily.  . [DISCONTINUED] erythromycin ophthalmic ointment Place a 1/2 inch ribbon of ointment into the lower eyelid. (Patient not taking: Reported on 11/29/2018)  . [DISCONTINUED] ibuprofen (CHILDRENS IBUPROFEN) 100 MG/5ML suspension Take 20 mLs (400 mg total) by mouth every 6 (six) hours as needed for fever. (Patient not taking: Reported on 10/10/2018)  . [DISCONTINUED] Multiple Vitamin (MULTIVITAMIN WITH MINERALS) TABS tablet Take 1 tablet by mouth daily. 2 gummies a day   No facility-administered encounter medications on file as of 04/16/2019.    Activities of Daily Living In your present state of health, do you have any difficulty performing the following activities: 04/16/2019  Hearing? N  Comment no hearing aids  Vision? N  Comment eyeglasses, sees Dr.Dingledin  Difficulty concentrating or making decisions? N  Walking  or climbing stairs? N  Dressing or bathing? N  Doing errands, shopping? N  Preparing Food and eating ? N  Using the Toilet? N  In the past six months, have you accidently leaked urine? N  Do you have problems with loss of bowel control? N  Managing your Medications? N  Managing your Finances? N  Housekeeping or managing your Housekeeping? N  Some recent data might be hidden    Patient Care Team: Guadalupe Maple, MD as PCP - General (Family Medicine) Dingeldein, Remo Lipps, MD (Ophthalmology)   Assessment:   This is a routine wellness examination for [facility].  Exercise Activities and Dietary recommendations Current Exercise Habits: Home exercise routine, Type of exercise: walking, Time (Minutes): 20, Frequency (Times/Week): 2, Weekly Exercise (Minutes/Week):  40, Intensity: Mild, Exercise limited by: None identified  Goals   None     Fall Risk: Fall Risk  04/16/2019 10/10/2018 04/13/2018 10/11/2017 03/17/2017  Falls in the past year? 0 0 0 No No  Number falls in past yr: 0 - 0 - -  Injury with Fall? 0 - 0 - -  Follow up - Falls evaluation completed - - -    FALL RISK PREVENTION PERTAINING TO THE HOME:  Any stairs in or around the home? Yes  If so, are there any without handrails? No   Home free of loose throw rugs in walkways, pet beds, electrical cords, etc? Yes  Adequate lighting in your home to reduce risk of falls? Yes   ASSISTIVE DEVICES UTILIZED TO PREVENT FALLS:  Life alert? No  Use of a cane, walker or w/c? Yes  walker as needed  Grab bars in the bathroom? Yes  Shower chair or bench in shower? Yes  Elevated toilet seat or a handicapped toilet? No   TIMED UP AND GO:  Was the test performed? Yes .  Length of time to ambulate 10 feet: 9 sec.   GAIT:  Appearance of gait: Gait steady and fast without the use of an assistive device.  Education: Fall risk prevention has been discussed.  Intervention(s) required? No  DME/home health order needed?  No   Depression  Screen PHQ 2/9 Scores 04/16/2019 04/13/2018 10/11/2017 03/17/2017  PHQ - 2 Score 0 0 0 0  PHQ- 9 Score - - - 0    Cognitive Function     6CIT Screen 04/13/2018  What Year? 0 points  What month? 0 points  What time? 0 points  Count back from 20 0 points  Months in reverse 0 points  Repeat phrase 0 points  Total Score 0    Immunization History  Administered Date(s) Administered  . Influenza, High Dose Seasonal PF 02/08/2017, 02/01/2018, 12/14/2018, 04/11/2019  . Influenza,inj,Quad PF,6+ Mos 01/22/2015  . Influenza-Unspecified 12/23/2015, 02/01/2018  . Pneumococcal Conjugate-13 11/06/2013  . Pneumococcal Polysaccharide-23 04/24/2015  . Pneumococcal-Unspecified 05/03/1996, 10/31/2001  . Tdap 11/11/2015, 10/02/2018  . Zoster 01/13/2006    Qualifies for Shingles Vaccine? Yes  Zostavax completed 2007. Due for Shingrix. Education has been provided regarding the importance of this vaccine. Pt has been advised to call insurance company to determine out of pocket expense. Advised may also receive vaccine at local pharmacy or Health Dept. Verbalized acceptance and understanding.  Tdap: up to date   Flu Vaccine: up to date   Pneumococcal Vaccine: up to date  Screening Tests Health Maintenance  Topic Date Due  . FOOT EXAM  10/12/2018  . HEMOGLOBIN A1C  04/11/2019  . OPHTHALMOLOGY EXAM  04/10/2020  . COLONOSCOPY  04/16/2020  . TETANUS/TDAP  10/01/2028  . INFLUENZA VACCINE  Completed  . PNA vac Low Risk Adult  Completed   Cancer Screenings:  Colorectal Screening: no longer required   Lung Cancer Screening: (Low Dose CT Chest recommended if Age 49-80 years, 30 pack-year currently smoking OR have quit w/in 15years.) does not qualify.    Additional Screening:  Hepatitis C Screening: does  Not qualify  Vision Screening: Recommended annual ophthalmology exams for early detection of glaucoma and other disorders of the eye. Is the patient up to date with their annual eye exam?   Yes  Who is the provider or what is the name of the office in which the pt attends annual eye exams? [facility]    Dental Screening: Recommended  annual dental exams for proper oral hygiene  Community Resource Referral:  CRR required this visit?  No        Plan:  I have personally reviewed and addressed the Medicare Annual Wellness questionnaire and have noted the following in the patient's chart:  A. Medical and social history B. Use of alcohol, tobacco or illicit drugs  C. Current medications and supplements D. Functional ability and status E.  Nutritional status F.  Physical activity G. Advance directives H. List of other physicians I.  Hospitalizations, surgeries, and ER visits in previous 12 months J.  [place] such as hearing and vision if needed, cognitive and depression L. Referrals and appointments   In addition, I have reviewed and discussed with patient certain preventive protocols, quality metrics, and best practice recommendations. A written personalized care plan for preventive services as well as general preventive health recommendations were provided to patient.   Signed,   Bevelyn Ngo, LPN  [id number] Nurse Health Advisor   Nurse Notes: none

## 2019-04-16 NOTE — Patient Instructions (Signed)
Mr. Adam Frost , Thank you for taking time to come for your Medicare Wellness Visit. I appreciate your ongoing commitment to your health goals. Please review the following plan we discussed and let me know if I can assist you in the future.   Screening recommendations/referrals: Colonoscopy: completed 2016 Recommended yearly ophthalmology/optometry visit for glaucoma screening and checkup Recommended yearly dental visit for hygiene and checkup  Vaccinations: Influenza vaccine: up to date  Pneumococcal vaccine: up to date Tdap vaccine: up to date  Shingles vaccine: shingrix eligible     Advanced directives: Please bring a copy of your health care power of attorney and living will to the office at your convenience.  Conditions/risks identified: diabetic, discussed chronic care management team.   Next appointment: Follow up in  One year for your annual wellness visit   Preventive Care 65 Years and Older, Male Preventive care refers to lifestyle choices and visits with your health care provider that can promote health and wellness. What does preventive care include?  A yearly physical exam. This is also called an annual well check.  Dental exams once or twice a year.  Routine eye exams. Ask your health care provider how often you should have your eyes checked.  Personal lifestyle choices, including:  Daily care of your teeth and gums.  Regular physical activity.  Eating a healthy diet.  Avoiding tobacco and drug use.  Limiting alcohol use.  Practicing safe sex.  Taking low doses of aspirin every day.  Taking vitamin and mineral supplements as recommended by your health care provider. What happens during an annual well check? The services and screenings done by your health care provider during your annual well check will depend on your age, overall health, lifestyle risk factors, and family history of disease. Counseling  Your health care provider may ask you questions  about your:  Alcohol use.  Tobacco use.  Drug use.  Emotional well-being.  Home and relationship well-being.  Sexual activity.  Eating habits.  History of falls.  Memory and ability to understand (cognition).  Work and work Statistician. Screening  You may have the following tests or measurements:  Height, weight, and BMI.  Blood pressure.  Lipid and cholesterol levels. These may be checked every 5 years, or more frequently if you are over 54 years old.  Skin check.  Lung cancer screening. You may have this screening every year starting at age 85 if you have a 30-pack-year history of smoking and currently smoke or have quit within the past 15 years.  Fecal occult blood test (FOBT) of the stool. You may have this test every year starting at age 59.  Flexible sigmoidoscopy or colonoscopy. You may have a sigmoidoscopy every 5 years or a colonoscopy every 10 years starting at age 57.  Prostate cancer screening. Recommendations will vary depending on your family history and other risks.  Hepatitis C blood test.  Hepatitis B blood test.  Sexually transmitted disease (STD) testing.  Diabetes screening. This is done by checking your blood sugar (glucose) after you have not eaten for a while (fasting). You may have this done every 1-3 years.  Abdominal aortic aneurysm (AAA) screening. You may need this if you are a current or former smoker.  Osteoporosis. You may be screened starting at age 62 if you are at high risk. Talk with your health care provider about your test results, treatment options, and if necessary, the need for more tests. Vaccines  Your health care provider may recommend  certain vaccines, such as:  Influenza vaccine. This is recommended every year.  Tetanus, diphtheria, and acellular pertussis (Tdap, Td) vaccine. You may need a Td booster every 10 years.  Zoster vaccine. You may need this after age 23.  Pneumococcal 13-valent conjugate (PCV13)  vaccine. One dose is recommended after age 70.  Pneumococcal polysaccharide (PPSV23) vaccine. One dose is recommended after age 30. Talk to your health care provider about which screenings and vaccines you need and how often you need them. This information is not intended to replace advice given to you by your health care provider. Make sure you discuss any questions you have with your health care provider. Document Released: 05/16/2015 Document Revised: 01/07/2016 Document Reviewed: 02/18/2015 Elsevier Interactive Patient Education  2017 [place] Prevention in the Home Falls can cause injuries. They can happen to people of all ages. There are many things you can do to make your home safe and to help prevent falls. What can I do on the outside of my home?  Regularly fix the edges of walkways and driveways and fix any cracks.  Remove anything that might make you trip as you walk through a door, such as a raised step or threshold.  Trim any bushes or trees on the path to your home.  Use bright outdoor lighting.  Clear any walking paths of anything that might make someone trip, such as rocks or tools.  Regularly check to see if handrails are loose or broken. Make sure that both sides of any steps have handrails.  Any raised decks and porches should have guardrails on the edges.  Have any leaves, snow, or ice cleared regularly.  Use sand or salt on walking paths during winter.  Clean up any spills in your garage right away. This includes oil or grease spills. What can I do in the bathroom?  Use night lights.  Install grab bars by the toilet and in the tub and shower. Do not use towel bars as grab bars.  Use non-skid mats or decals in the tub or shower.  If you need to sit down in the shower, use a plastic, non-slip stool.  Keep the floor dry. Clean up any water that spills on the floor as soon as it happens.  Remove soap buildup in the tub or shower  regularly.  Attach bath mats securely with double-sided non-slip rug tape.  Do not have throw rugs and other things on the floor that can make you trip. What can I do in the bedroom?  Use night lights.  Make sure that you have a light by your bed that is easy to reach.  Do not use any sheets or blankets that are too big for your bed. They should not hang down onto the floor.  Have a firm chair that has side arms. You can use this for support while you get dressed.  Do not have throw rugs and other things on the floor that can make you trip. What can I do in the kitchen?  Clean up any spills right away.  Avoid walking on wet floors.  Keep items that you use a lot in easy-to-reach places.  If you need to reach something above you, use a strong step stool that has a grab bar.  Keep electrical cords out of the way.  Do not use floor polish or wax that makes floors slippery. If you must use wax, use non-skid floor wax.  Do not have throw rugs and other  things on the floor that can make you trip. What can I do with my stairs?  Do not leave any items on the stairs.  Make sure that there are handrails on both sides of the stairs and use them. Fix handrails that are broken or loose. Make sure that handrails are as long as the stairways.  Check any carpeting to make sure that it is firmly attached to the stairs. Fix any carpet that is loose or worn.  Avoid having throw rugs at the top or bottom of the stairs. If you do have throw rugs, attach them to the floor with carpet tape.  Make sure that you have a light switch at the top of the stairs and the bottom of the stairs. If you do not have them, ask someone to add them for you. What else can I do to help prevent falls?  Wear shoes that:  Do not have high heels.  Have rubber bottoms.  Are comfortable and fit you well.  Are closed at the toe. Do not wear sandals.  If you use a stepladder:  Make sure that it is fully  opened. Do not climb a closed stepladder.  Make sure that both sides of the stepladder are locked into place.  Ask someone to hold it for you, if possible.  Clearly mark and make sure that you can see:  Any grab bars or handrails.  First and last steps.  Where the edge of each step is.  Use tools that help you move around (mobility aids) if they are needed. These include:  Canes.  Walkers.  Scooters.  Crutches.  Turn on the lights when you go into a dark area. Replace any light bulbs as soon as they burn out.  Set up your furniture so you have a clear path. Avoid moving your furniture around.  If any of your floors are uneven, fix them.  If there are any pets around you, be aware of where they are.  Review your medicines with your doctor. Some medicines can make you feel dizzy. This can increase your chance of falling. Ask your doctor what other things that you can do to help prevent falls. This information is not intended to replace advice given to you by your health care provider. Make sure you discuss any questions you have with your health care provider. Document Released: 02/13/2009 Document Revised: 09/25/2015 Document Reviewed: 05/24/2014 Elsevier Interactive Patient Education  2017 [employer].

## 2019-05-25 ENCOUNTER — Encounter: Payer: Self-pay | Admitting: Family Medicine

## 2020-10-22 IMAGING — CT CT CERVICAL SPINE WITHOUT CONTRAST
5 of 8 series · 12 of 33 positions shown, 13 images · non-contrast
Comparison: None.

CLINICAL DATA: 77-year-old male with head trauma.

EXAM:
CT HEAD WITHOUT CONTRAST
CT CERVICAL SPINE WITHOUT CONTRAST
TECHNIQUE: Multidetector CT imaging of the head and cervical spine was
performed following the standard protocol without intravenous
contrast. Multiplanar CT image reconstructions of the cervical spine
were also generated.

[Series 5: head bone · axial · 0.45mm/px · z∈[-64,-6]mm · 2 of 87 slices shown]
[im 29/87  bone]
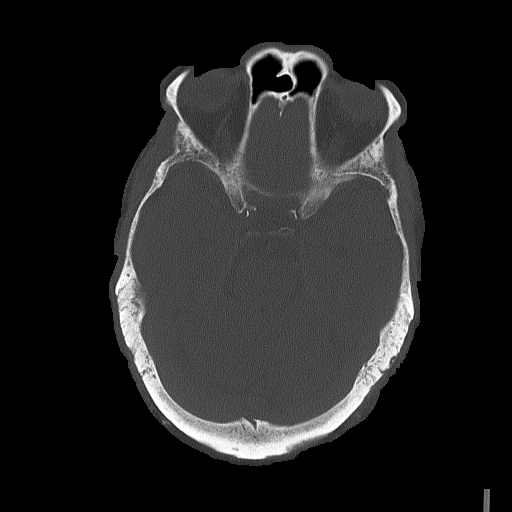
[im 58/87  bone]
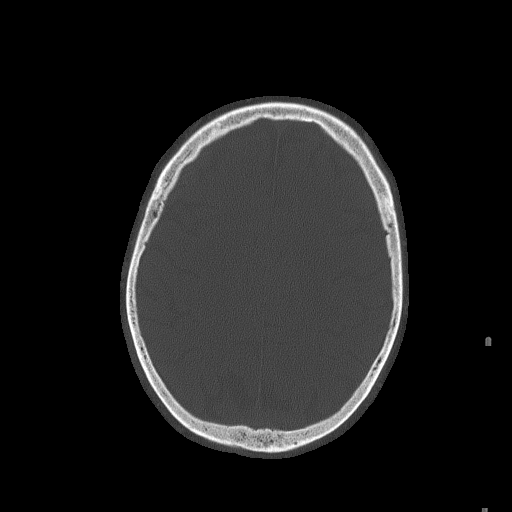

[Series 8: c_spine 2.0 st · axial · 0.37mm/px · z∈[-234,-166]mm · 2 of 103 slices shown, 3 images]
[im 35/103  soft-tissue]
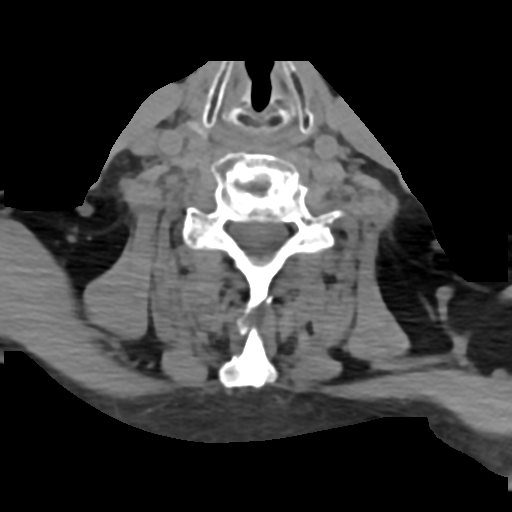
[im 35/103  bone]
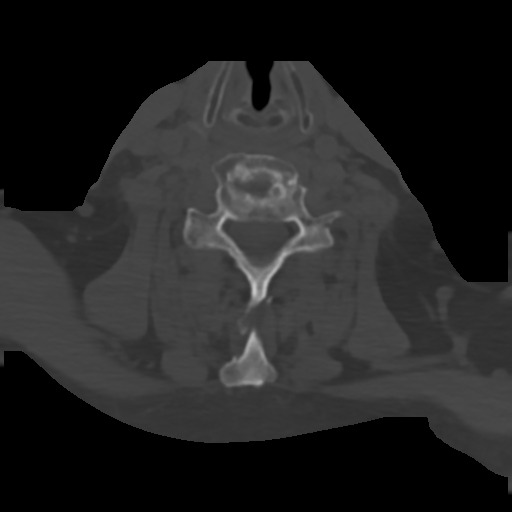
[im 69/103  bone]
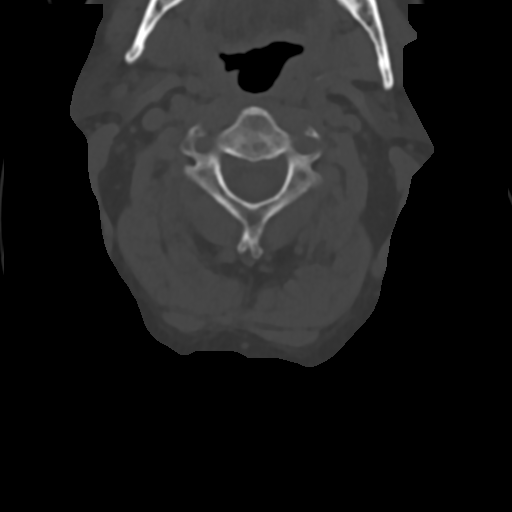

[Series 10: c_spine 2.0 sag bone · sagittal · 0.30mm/px · 5 of 61 slices shown]
[im 11/61  bone]
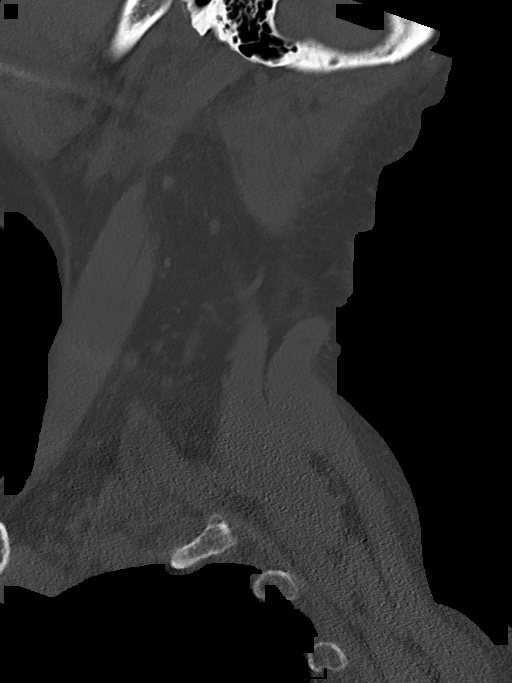
[im 21/61  bone]
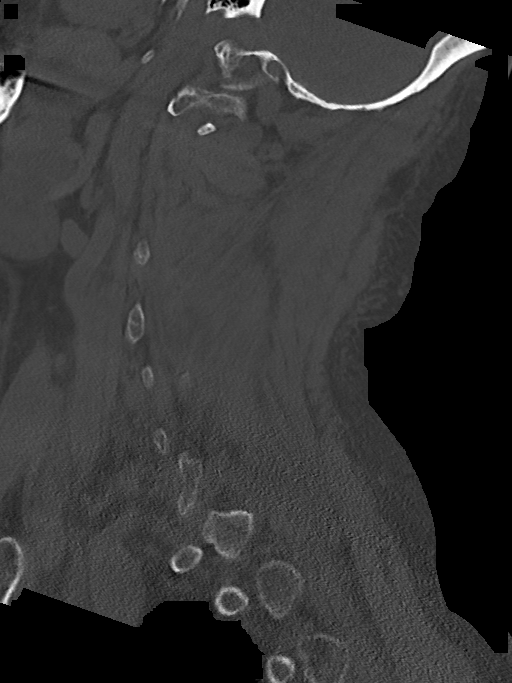
[im 31/61  bone]
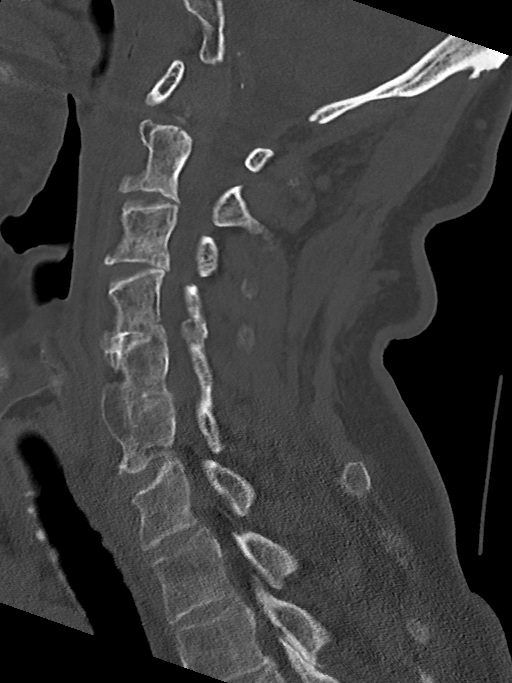
[im 41/61  bone]
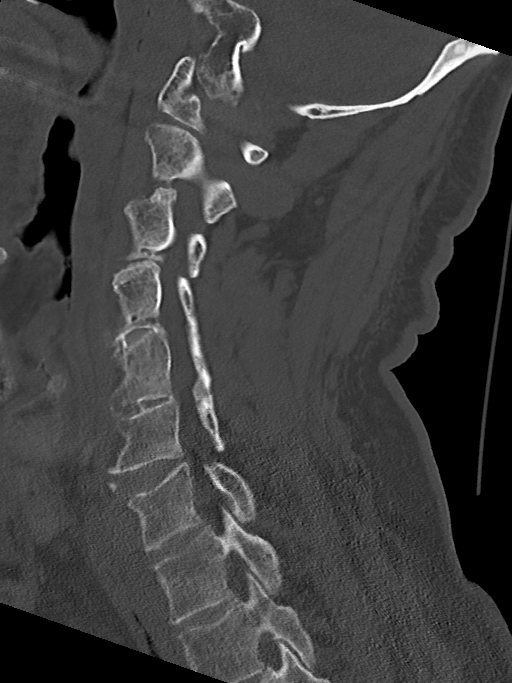
[im 51/61  bone]
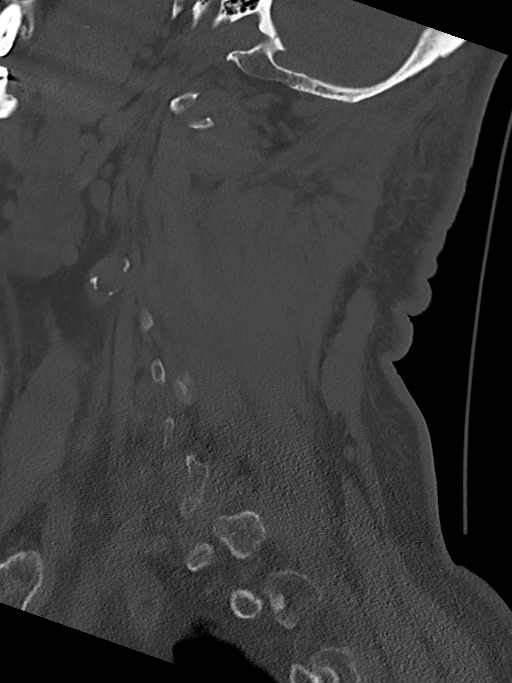

[Series 11: c_spine 2.0 cor bone · coronal · 0.30mm/px · 1 of 61 slices shown]
[im 31/61  bone]
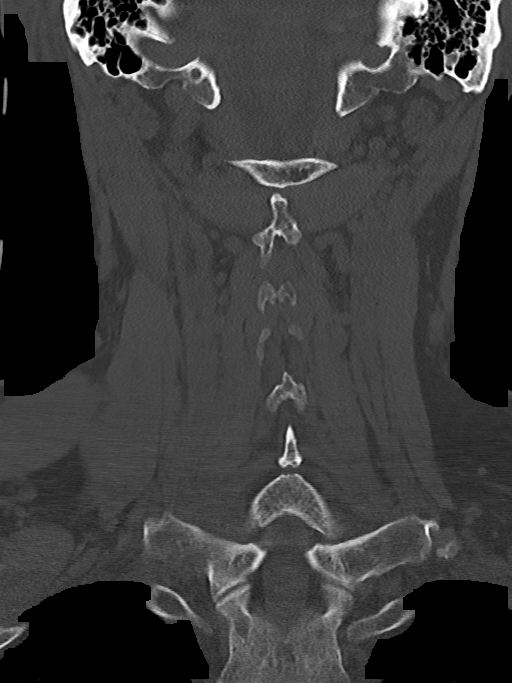

[Series 12: c_spine 2.0 orthogonals · axial · 0.21mm/px · z∈[-268,-201]mm · 2 of 102 slices shown]
[im 34/102  bone]
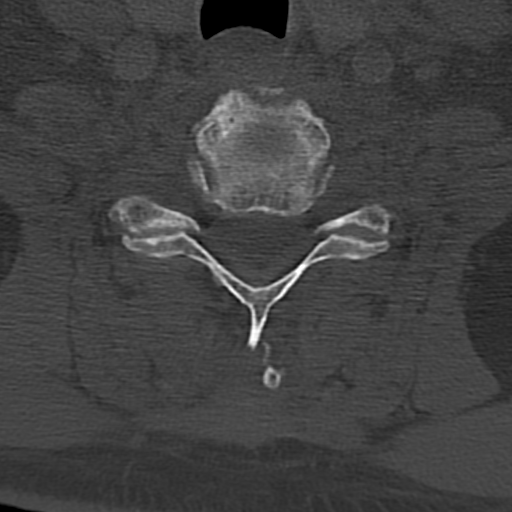
[im 68/102  bone]
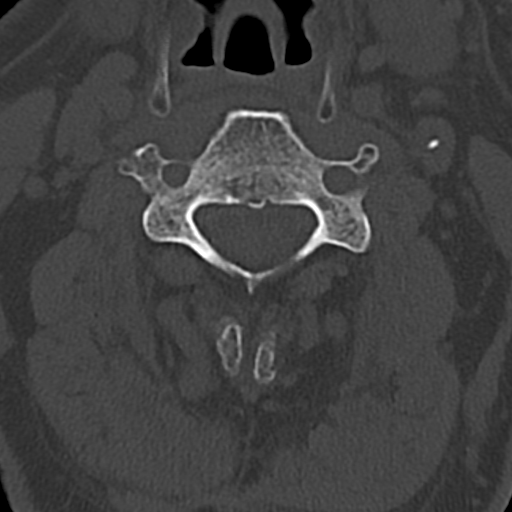

[12 of 33 positions shown; findings below may reference images not displayed]

FINDINGS: CT HEAD FINDINGS

Brain: The ventricles and sulci appropriate size for patient's age.
Mild periventricular and deep white matter chronic microvascular
ischemic changes noted. There is no acute intracranial hemorrhage.
No mass effect or midline shift. No extra-axial fluid collection.

Vascular: No hyperdense vessel or unexpected calcification.

Skull: Normal. Negative for fracture or focal lesion.

Sinuses/Orbits: The visualized paranasal sinuses and mastoid air
cells are clear.

Other: Skin staples over the vertex noted.

CT CERVICAL SPINE FINDINGS

Alignment: No acute subluxation.

Skull base and vertebrae: No acute fracture. Osteopenia.

Soft tissues and spinal canal: No prevertebral fluid or swelling. No
visible canal hematoma.

Disc levels:  No acute findings.

Upper chest: Negative.

Other: Bilateral carotid bulb calcified plaques.
IMPRESSION: 1. No acute intracranial hemorrhage.
2. No acute/traumatic cervical spine pathology.

## 2022-10-13 ENCOUNTER — Other Ambulatory Visit: Payer: Self-pay | Admitting: Family Medicine

## 2022-10-13 DIAGNOSIS — M5416 Radiculopathy, lumbar region: Secondary | ICD-10-CM

## 2022-10-29 ENCOUNTER — Ambulatory Visit
Admission: RE | Admit: 2022-10-29 | Discharge: 2022-10-29 | Disposition: A | Discharge status: Home / Self Care | Payer: Medicare PPO | Source: Ambulatory Visit | Attending: Family Medicine | Admitting: Family Medicine

## 2022-10-29 DIAGNOSIS — M5416 Radiculopathy, lumbar region: Secondary | ICD-10-CM

## 2022-12-20 ENCOUNTER — Ambulatory Visit: Payer: Medicare PPO | Attending: Family Medicine

## 2022-12-20 ENCOUNTER — Other Ambulatory Visit: Payer: Self-pay

## 2022-12-20 DIAGNOSIS — M79652 Pain in left thigh: Secondary | ICD-10-CM | POA: Insufficient documentation

## 2022-12-20 DIAGNOSIS — M6281 Muscle weakness (generalized): Secondary | ICD-10-CM | POA: Diagnosis present
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
## 2022-12-23 ENCOUNTER — Ambulatory Visit: Payer: Medicare PPO

## 2022-12-23 DIAGNOSIS — M79652 Pain in left thigh: Secondary | ICD-10-CM

## 2022-12-23 DIAGNOSIS — M6281 Muscle weakness (generalized): Secondary | ICD-10-CM
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
## 2022-12-27 ENCOUNTER — Ambulatory Visit: Payer: Medicare PPO

## 2022-12-27 DIAGNOSIS — M6281 Muscle weakness (generalized): Secondary | ICD-10-CM

## 2022-12-27 DIAGNOSIS — M79652 Pain in left thigh: Secondary | ICD-10-CM | POA: Diagnosis not present
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
## 2022-12-30 ENCOUNTER — Ambulatory Visit: Payer: Medicare PPO

## 2022-12-30 DIAGNOSIS — M6281 Muscle weakness (generalized): Secondary | ICD-10-CM

## 2022-12-30 DIAGNOSIS — M79652 Pain in left thigh: Secondary | ICD-10-CM
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
## 2023-01-06 ENCOUNTER — Ambulatory Visit: Payer: Medicare PPO | Attending: Family Medicine

## 2023-01-06 DIAGNOSIS — M6281 Muscle weakness (generalized): Secondary | ICD-10-CM | POA: Diagnosis present

## 2023-01-06 DIAGNOSIS — M79652 Pain in left thigh: Secondary | ICD-10-CM | POA: Insufficient documentation
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
## 2023-01-14 ENCOUNTER — Ambulatory Visit: Payer: Medicare PPO

## 2023-01-14 DIAGNOSIS — M79652 Pain in left thigh: Secondary | ICD-10-CM | POA: Diagnosis not present

## 2023-01-14 DIAGNOSIS — M6281 Muscle weakness (generalized): Secondary | ICD-10-CM
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
## 2023-01-19 ENCOUNTER — Ambulatory Visit: Payer: Medicare PPO

## 2023-01-19 DIAGNOSIS — M6281 Muscle weakness (generalized): Secondary | ICD-10-CM

## 2023-01-19 DIAGNOSIS — M79652 Pain in left thigh: Secondary | ICD-10-CM
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
## 2023-01-26 ENCOUNTER — Ambulatory Visit: Payer: Medicare PPO

## 2023-01-26 DIAGNOSIS — M79652 Pain in left thigh: Secondary | ICD-10-CM | POA: Diagnosis not present

## 2023-01-26 DIAGNOSIS — M6281 Muscle weakness (generalized): Secondary | ICD-10-CM
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
## 2023-01-28 ENCOUNTER — Ambulatory Visit: Payer: Medicare PPO

## 2023-01-28 DIAGNOSIS — M79652 Pain in left thigh: Secondary | ICD-10-CM

## 2023-01-28 DIAGNOSIS — M6281 Muscle weakness (generalized): Secondary | ICD-10-CM
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
## 2023-02-02 ENCOUNTER — Ambulatory Visit: Payer: Medicare PPO | Attending: Family Medicine

## 2023-02-02 DIAGNOSIS — M79652 Pain in left thigh: Secondary | ICD-10-CM | POA: Diagnosis present

## 2023-02-02 DIAGNOSIS — M6281 Muscle weakness (generalized): Secondary | ICD-10-CM | POA: Diagnosis present
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
## 2023-02-04 ENCOUNTER — Ambulatory Visit: Payer: Medicare PPO

## 2023-02-08 ENCOUNTER — Ambulatory Visit: Payer: Medicare PPO

## 2023-07-20 ENCOUNTER — Other Ambulatory Visit: Payer: Self-pay | Admitting: Cardiology

## 2023-07-20 ENCOUNTER — Telehealth (HOSPITAL_COMMUNITY): Payer: Self-pay | Admitting: *Deleted

## 2023-07-20 DIAGNOSIS — R079 Chest pain, unspecified: Secondary | ICD-10-CM

## 2023-07-20 DIAGNOSIS — R9439 Abnormal result of other cardiovascular function study: Secondary | ICD-10-CM

## 2023-07-20 DIAGNOSIS — R0609 Other forms of dyspnea: Secondary | ICD-10-CM

## 2023-07-20 NOTE — Telephone Encounter (Signed)
 Reaching out to patient to offer assistance regarding upcoming cardiac imaging study; pt verbalizes understanding of appt date/time, parking situation and where to check in, pre-test NPO status and verified current allergies; name and call back number provided for further questions should they arise  Larey Brick RN Navigator Cardiac Imaging [facility] [phone] office [phone] cell  Patient to take his daily 25mg  metoprolol succinate.

## 2023-07-21 ENCOUNTER — Ambulatory Visit
Admission: RE | Admit: 2023-07-21 | Discharge: 2023-07-21 | Disposition: A | Discharge status: Home / Self Care | Source: Ambulatory Visit | Attending: Cardiology | Admitting: Cardiology

## 2023-07-21 ENCOUNTER — Other Ambulatory Visit: Payer: Self-pay | Admitting: Cardiology

## 2023-07-21 DIAGNOSIS — R0609 Other forms of dyspnea: Secondary | ICD-10-CM | POA: Diagnosis present

## 2023-07-21 DIAGNOSIS — R9439 Abnormal result of other cardiovascular function study: Secondary | ICD-10-CM | POA: Diagnosis not present

## 2023-07-21 DIAGNOSIS — I2510 Atherosclerotic heart disease of native coronary artery without angina pectoris: Secondary | ICD-10-CM | POA: Diagnosis not present

## 2023-07-21 DIAGNOSIS — R931 Abnormal findings on diagnostic imaging of heart and coronary circulation: Secondary | ICD-10-CM | POA: Insufficient documentation

## 2023-07-21 DIAGNOSIS — R079 Chest pain, unspecified: Secondary | ICD-10-CM | POA: Insufficient documentation

## 2023-07-21 MED ORDER — NITROGLYCERIN 0.4 MG SL SUBL
0.8000 mg | SUBLINGUAL_TABLET | Freq: Once | SUBLINGUAL | Status: AC
  Administered 2023-07-21: 0.8 mg via SUBLINGUAL

## 2023-07-21 MED ORDER — SODIUM CHLORIDE 0.9 % IV BOLUS
150.0000 mL | Freq: Once | INTRAVENOUS | Status: AC
  Administered 2023-07-21: 150 mL via INTRAVENOUS

## 2023-07-21 MED ORDER — IOHEXOL 350 MG/ML SOLN
75.0000 mL | Freq: Once | INTRAVENOUS | Status: AC | PRN
  Administered 2023-07-21: 75 mL via INTRAVENOUS

## 2023-07-21 NOTE — Progress Notes (Signed)
Patient tolerated procedure well. Ambulate w/o difficulty. Denies any lightheadedness or being dizzy. Pt denies any pain at this time. Sitting in chair, drinking water provided. Pt is encouraged to drink additional water throughout the day and reason explained to patient. Patient verbalized understanding and all questions answered. ABC intact. No further needs at this time. Discharge from procedure area w/o issues.  

## 2023-08-03 ENCOUNTER — Encounter: Payer: Self-pay | Admitting: Cardiology

## 2023-08-03 ENCOUNTER — Other Ambulatory Visit: Payer: Self-pay

## 2023-08-03 ENCOUNTER — Ambulatory Visit
Admission: RE | Admit: 2023-08-03 | Discharge: 2023-08-03 | Disposition: A | Discharge status: Home / Self Care | Attending: Cardiology | Admitting: Cardiology

## 2023-08-03 ENCOUNTER — Encounter
Admission: RE | Disposition: A | Discharge status: Home / Self Care | Payer: Self-pay | Source: Home / Self Care | Attending: Cardiology

## 2023-08-03 DIAGNOSIS — I2582 Chronic total occlusion of coronary artery: Secondary | ICD-10-CM | POA: Diagnosis not present

## 2023-08-03 DIAGNOSIS — R9439 Abnormal result of other cardiovascular function study: Secondary | ICD-10-CM | POA: Diagnosis present

## 2023-08-03 DIAGNOSIS — I2510 Atherosclerotic heart disease of native coronary artery without angina pectoris: Secondary | ICD-10-CM | POA: Diagnosis not present

## 2023-08-03 DIAGNOSIS — I2584 Coronary atherosclerosis due to calcified coronary lesion: Secondary | ICD-10-CM | POA: Diagnosis not present

## 2023-08-03 DIAGNOSIS — Z01818 Encounter for other preprocedural examination: Secondary | ICD-10-CM

## 2023-08-03 HISTORY — PX: LEFT HEART CATH AND CORONARY ANGIOGRAPHY: CATH118249

## 2023-08-03 LAB — PROTIME-INR
INR: 1.1 (ref 0.8–1.2)
Prothrombin Time: 14 s (ref 11.4–15.2)

## 2023-08-03 LAB — CBC
HCT: 40.3 % (ref 39.0–52.0)
Hemoglobin: 13.6 g/dL (ref 13.0–17.0)
MCH: 30.9 pg (ref 26.0–34.0)
MCHC: 33.7 g/dL (ref 30.0–36.0)
MCV: 91.6 fL (ref 80.0–100.0)
Platelets: 158 10*3/uL (ref 150–400)
RBC: 4.4 MIL/uL (ref 4.22–5.81)
RDW: 13.2 % (ref 11.5–15.5)
WBC: 4.6 10*3/uL (ref 4.0–10.5)
nRBC: 0.4 % — ABNORMAL HIGH (ref 0.0–0.2)

## 2023-08-03 LAB — GLUCOSE, CAPILLARY: Glucose-Capillary: 171 mg/dL — ABNORMAL HIGH (ref 70–99)

## 2023-08-03 LAB — BASIC METABOLIC PANEL WITH GFR
Anion gap: 11 (ref 5–15)
BUN: 27 mg/dL — ABNORMAL HIGH (ref 8–23)
CO2: 23 mmol/L (ref 22–32)
Calcium: 9.5 mg/dL (ref 8.9–10.3)
Chloride: 104 mmol/L (ref 98–111)
Creatinine, Ser: 1.46 mg/dL — ABNORMAL HIGH (ref 0.61–1.24)
GFR, Estimated: 47 mL/min — ABNORMAL LOW (ref >60)
Glucose, Bld: 171 mg/dL — ABNORMAL HIGH (ref 70–99)
Potassium: 3.7 mmol/L (ref 3.5–5.1)
Sodium: 138 mmol/L (ref 135–145)

## 2023-08-03 SURGERY — LEFT HEART CATH AND CORONARY ANGIOGRAPHY
Anesthesia: Moderate Sedation | Laterality: Left

## 2023-08-03 MED ORDER — MIDAZOLAM HCL 2 MG/2ML IJ SOLN
INTRAMUSCULAR | Status: AC
  Filled 2023-08-03: qty 2

## 2023-08-03 MED ORDER — SODIUM CHLORIDE 0.9 % IV SOLN: 250.0000 mL | INTRAVENOUS | Status: DC | PRN

## 2023-08-03 MED ORDER — HEPARIN SODIUM (PORCINE) 1000 UNIT/ML IJ SOLN
INTRAMUSCULAR | Status: DC | PRN
  Administered 2023-08-03: 4000 [IU] via INTRAVENOUS

## 2023-08-03 MED ORDER — SODIUM CHLORIDE 0.9 % WEIGHT BASED INFUSION
3.0000 mL/kg/h | INTRAVENOUS | Status: AC
Start: 2023-08-03 — End: 2023-08-03
  Administered 2023-08-03: 3 mL/kg/h via INTRAVENOUS

## 2023-08-03 MED ORDER — HEPARIN (PORCINE) IN NACL 1000-0.9 UT/500ML-% IV SOLN
INTRAVENOUS | Status: AC
  Filled 2023-08-03: qty 1000

## 2023-08-03 MED ORDER — SODIUM CHLORIDE 0.9% FLUSH: 3.0000 mL | INTRAVENOUS | Status: DC | PRN

## 2023-08-03 MED ORDER — VERAPAMIL HCL 2.5 MG/ML IV SOLN
INTRAVENOUS | Status: DC | PRN
Start: 2023-08-03 — End: 2023-08-03
  Administered 2023-08-03: 2.5 mg via INTRAVENOUS

## 2023-08-03 MED ORDER — VERAPAMIL HCL 2.5 MG/ML IV SOLN
INTRAVENOUS | Status: AC
Start: 2023-08-03 — End: ?
  Filled 2023-08-03: qty 2

## 2023-08-03 MED ORDER — HEPARIN SODIUM (PORCINE) 1000 UNIT/ML IJ SOLN
INTRAMUSCULAR | Status: AC
  Filled 2023-08-03: qty 10

## 2023-08-03 MED ORDER — FENTANYL CITRATE (PF) 100 MCG/2ML IJ SOLN
INTRAMUSCULAR | Status: AC
  Filled 2023-08-03: qty 2

## 2023-08-03 MED ORDER — SODIUM CHLORIDE 0.9% FLUSH: 3.0000 mL | Freq: Two times a day (BID) | INTRAVENOUS | Status: DC

## 2023-08-03 MED ORDER — ACETAMINOPHEN 325 MG PO TABS: 650.0000 mg | ORAL_TABLET | ORAL | Status: DC | PRN

## 2023-08-03 MED ORDER — SODIUM CHLORIDE 0.9 % WEIGHT BASED INFUSION: 1.0000 mL/kg/h | INTRAVENOUS | Status: DC

## 2023-08-03 MED ORDER — MIDAZOLAM HCL 2 MG/2ML IJ SOLN
INTRAMUSCULAR | Status: DC | PRN
  Administered 2023-08-03: 1 mg via INTRAVENOUS

## 2023-08-03 MED ORDER — ASPIRIN 81 MG PO CHEW: 81.0000 mg | CHEWABLE_TABLET | ORAL | Status: DC

## 2023-08-03 MED ORDER — HEPARIN (PORCINE) IN NACL 2000-0.9 UNIT/L-% IV SOLN
INTRAVENOUS | Status: DC | PRN
  Administered 2023-08-03: 1000 mL

## 2023-08-03 MED ORDER — HYDRALAZINE HCL 20 MG/ML IJ SOLN: 10.0000 mg | INTRAMUSCULAR | Status: DC | PRN

## 2023-08-03 MED ORDER — FENTANYL CITRATE (PF) 100 MCG/2ML IJ SOLN
INTRAMUSCULAR | Status: DC | PRN
  Administered 2023-08-03: 25 ug via INTRAVENOUS

## 2023-08-03 MED ORDER — IOHEXOL 300 MG/ML  SOLN
INTRAMUSCULAR | Status: DC | PRN
  Administered 2023-08-03: 63 mL

## 2023-08-03 MED ORDER — LABETALOL HCL 5 MG/ML IV SOLN: 10.0000 mg | INTRAVENOUS | Status: DC | PRN

## 2023-08-03 MED ORDER — ONDANSETRON HCL 4 MG/2ML IJ SOLN
4.0000 mg | Freq: Four times a day (QID) | INTRAMUSCULAR | Status: DC | PRN
Start: 2023-08-03 — End: 2023-08-03

## 2023-08-03 MED ORDER — LIDOCAINE HCL (PF) 1 % IJ SOLN
INTRAMUSCULAR | Status: DC | PRN
  Administered 2023-08-03: 2 mL

## 2023-08-03 SURGICAL SUPPLY — 13 items
CATH 5FR JL3.5 JR4 ANG PIG MP (CATHETERS) IMPLANT
CATH INFINITI 5FR JL4 (CATHETERS) IMPLANT
DEVICE RAD TR BAND REGULAR (VASCULAR PRODUCTS) IMPLANT
DRAPE BRACHIAL (DRAPES) IMPLANT
GLIDESHEATH SLEND SS 6F .021 (SHEATH) IMPLANT
GUIDEWIRE INQWIRE 1.5J.035X260 (WIRE) IMPLANT
INQWIRE 1.5J .035X260CM (WIRE) ×2 IMPLANT
KIT ENCORE 26 ADVANTAGE (KITS) IMPLANT
KIT SYRINGE INJ CVI SPIKEX1 (MISCELLANEOUS) IMPLANT
PACK CARDIAC CATH (CUSTOM PROCEDURE TRAY) ×1 IMPLANT
PROTECTION STATION PRESSURIZED (MISCELLANEOUS) ×1 IMPLANT
SET ATX-X65L (MISCELLANEOUS) IMPLANT
STATION PROTECTION PRESSURIZED (MISCELLANEOUS) IMPLANT

## 2023-08-03 NOTE — Discharge Instructions (Signed)
 Radial Site Care Refer to this sheet in the next few weeks. These instructions provide you with information about caring for yourself after your procedure. Your health care provider may also give you more specific instructions. Your treatment has been planned according to current medical practices, but problems sometimes occur. Call your health care provider if you have any problems or questions after your procedure. What can I expect after the procedure? After your procedure, it is typical to have the following: Bruising at the radial site that usually fades within 1-2 weeks. Blood collecting in the tissue (hematoma) that may be painful to the touch. It should usually decrease in size and tenderness within 1-2 weeks.  Follow these instructions at home: Take medicines only as directed by your health care provider. If you are on a medication called Metformin please do not take for 48 hours after your procedure. Over the next 48hrs please increase your fluid intake of water and non caffeine beverages to flush the contrast dye out of your system.  You may shower 24 hours after the procedure  Leave your bandage on and gently wash the site with plain soap and water. Pat the area dry with a clean towel. Do not rub the site, because this may cause bleeding.  Remove your dressing 48hrs after your procedure and leave open to air.  Do not submerge your site in water for 7 days. This includes swimming and washing dishes.  Check your insertion site every day for redness, swelling, or drainage. Do not apply powder or lotion to the site. Do not flex or bend the affected arm for 24 hours or as directed by your health care provider. Do not push or pull heavy objects with the affected arm for 24 hours or as directed by your health care provider. Do not lift over 10 lb (4.5 kg) for 5 days after your procedure or as directed by your health care provider. Ask your health care provider when it is okay to: Return to  work or school. Resume usual physical activities or sports. Resume sexual activity. Do not drive home if you are discharged the same day as the procedure. Have someone else drive you. You may drive 48 hours after the procedure Do not operate machinery or power tools for 24 hours after the procedure. If your procedure was done as an outpatient procedure, which means that you went home the same day as your procedure, a responsible adult should be with you for the first 24 hours after you arrive home. Keep all follow-up visits as directed by your health care provider. This is important. Contact a health care provider if: You have a fever. You have chills. You have increased bleeding from the radial site. Hold pressure on the site. Get help right away if: You have unusual pain at the radial site. You have redness, warmth, or swelling at the radial site. You have drainage (other than a small amount of blood on the dressing) from the radial site. The radial site is bleeding, and the bleeding does not stop after 15 minutes of holding steady pressure on the site. Your arm or hand becomes pale, cool, tingly, or numb. This information is not intended to replace advice given to you by your health care provider. Make sure you discuss any questions you have with your health care provider. Document Released: 05/22/2010 Document Revised: 09/25/2015 Document Reviewed: 11/05/2013 Elsevier Interactive Patient Education  2018 [employer].

## 2023-08-04 ENCOUNTER — Other Ambulatory Visit: Payer: Self-pay

## 2023-08-04 ENCOUNTER — Encounter: Payer: Self-pay | Admitting: Cardiology

## 2023-08-04 ENCOUNTER — Institutional Professional Consult (permissible substitution): Admitting: Surgery

## 2023-08-04 VITALS — BP 118/62 | HR 60 | Resp 20 | Ht 70.5 in | Wt 171.4 lb

## 2023-08-04 DIAGNOSIS — I25119 Atherosclerotic heart disease of native coronary artery with unspecified angina pectoris: Secondary | ICD-10-CM

## 2023-08-04 DIAGNOSIS — I2510 Atherosclerotic heart disease of native coronary artery without angina pectoris: Secondary | ICD-10-CM | POA: Insufficient documentation

## 2023-08-04 LAB — LIPOPROTEIN A (LPA): Lipoprotein (a): 79.4 nmol/L — ABNORMAL HIGH (ref <75.0)

## 2023-08-04 NOTE — Progress Notes (Signed)
 Cardiothoracic Surgery Consultation  PCP is Adam Rud, MD Referring Provider is Adam Millard, MD  Chief Complaint  Patient presents with   Coronary Artery Disease    New patient consultation, review all studies    HPI:  The patient is an 83 year old active gentleman with a history of type 2 diabetes, hypertension, and hyperlipidemia who presented with a 51-month history of progressive exertional shortness of breath and fatigue as well as occasional episodes of substernal chest tightness.  He underwent a stress test showing inferior ST changes without any symptoms.  A cardiac CT showed a coronary calcium score of 1681 which was 75th percentile.  It suggested a high-grade stenosis in the left main coronary artery.  CT FFR across the left main was 0.74.  The distal RCA was 0.73.  A 2D echocardiogram showed an ejection fraction of 50% with inferior hypokinesis and no significant valvular abnormality.  Cardiac catheterization yesterday by Dr. Darrold Frost showed a diffusely diseased left main with 70% stenosis.  The proximal LAD had a long area of 85% stenosis.  There is about 50% mid LAD stenosis.  There was a small first diagonal branch with 75% stenosis.  The left circumflex had 95% proximal stenosis.  The RCA had a 95% distal stenosis before the posterior descending branch which was completely occluded and filled very faintly by collaterals.  There was a large posterolateral branch.  Left ventricular ejection fraction was 45%-50% visually with an LVEDP of 10.  The patient lives at home with his wife.  His wife, daughter and son are with him today.  And he stays active mowing his grass and working in his yard.  He is a lifelong non-smoker. Past Medical History:  Diagnosis Date   Abdominal hernia    Diabetes mellitus without complication (HCC)    Hemorrhoid    Hyperlipidemia    Hypertension     Past Surgical History:  Procedure Laterality Date   COLONOSCOPY     COLONOSCOPY WITH  PROPOFOL N/A 04/17/2015   Procedure: COLONOSCOPY WITH PROPOFOL;  Surgeon: Adam Cullens, MD;  Location: [facility] ENDOSCOPY;  Service: Gastroenterology;  Laterality: N/A;   HERNIA REPAIR     HYDROCELE EXCISION / REPAIR     LEFT HEART CATH AND CORONARY ANGIOGRAPHY Left 08/03/2023   Procedure: LEFT HEART CATH AND CORONARY ANGIOGRAPHY;  Surgeon: Adam Millard, MD;  Location: [facility] INVASIVE CV LAB;  Service: Cardiovascular;  Laterality: Left;    Family History  Problem Relation Age of Onset   Heart disease Mother    Colon cancer Father    Colon cancer Brother     Social History Social History   Tobacco Use   Smoking status: Never   Smokeless tobacco: Never  Vaping Use   Vaping status: Never Used  Substance Use Topics   Alcohol use: No   Drug use: No    Current Outpatient Medications  Medication Sig Dispense Refill   amLODipine (NORVASC) 5 MG tablet Take 1 tablet (5 mg total) by mouth daily. 90 tablet 4   aspirin EC 81 MG tablet Take 81 mg by mouth daily.     atorvastatin (LIPITOR) 40 MG tablet Take 1 tablet (40 mg total) by mouth daily. 90 tablet 4   benazepril (LOTENSIN) 40 MG tablet Take 40 mg by mouth daily.     glipiZIDE-metformin (METAGLIP) 2.5-500 MG tablet Take 2 tablets by mouth 2 (two) times daily before a meal. 360 tablet 4   hydrochlorothiazide (HYDRODIURIL) 12.5 MG tablet Take 1 tablet (  12.5 mg total) by mouth daily. 90 tablet 0   isosorbide mononitrate (IMDUR) 30 MG 24 hr tablet Take 30 mg by mouth daily.     metoprolol succinate (TOPROL-XL) 25 MG 24 hr tablet Take 25 mg by mouth daily.     OVER THE COUNTER MEDICATION Take 2 tablets by mouth daily. VitaFusion - Omega 3/EPA/DHEA/ALA     triamcinolone cream (KENALOG) 0.1 % Apply 1 application topically 2 (two) times daily as needed (rash).   2   No current facility-administered medications for this visit.    No Known Allergies  Review of Systems  Constitutional:  Positive for activity change, fatigue and unexpected  weight change.  HENT: Negative.    Eyes: Negative.   Respiratory:  Positive for shortness of breath.   Cardiovascular:  Positive for chest pain and leg swelling.  Gastrointestinal: Negative.   Endocrine: Negative.   Genitourinary: Negative.   Musculoskeletal: Negative.   Skin: Negative.   Allergic/Immunologic: Negative.   Neurological:  Negative for dizziness and syncope.  Hematological: Negative.   Psychiatric/Behavioral: Negative.      Ht 5' 10.5" (1.791 m)   BMI 23.89 kg/m  Physical Exam Constitutional:      Appearance: Normal appearance.  HENT:     Head: Normocephalic and atraumatic.  Eyes:     Extraocular Movements: Extraocular movements intact.     Conjunctiva/sclera: Conjunctivae normal.     Pupils: Pupils are equal, round, and reactive to light.  Neck:     Vascular: No carotid bruit.  Cardiovascular:     Rate and Rhythm: Normal rate and regular rhythm.     Pulses: Normal pulses.     Heart sounds: Normal heart sounds. No murmur heard. Pulmonary:     Effort: Pulmonary effort is normal.     Breath sounds: Normal breath sounds.  Abdominal:     General: There is no distension.     Tenderness: There is no abdominal tenderness.  Musculoskeletal:        General: No swelling.     Cervical back: Normal range of motion and neck supple.     Comments: Bilateral lower leg varicose veins.  Skin:    General: Skin is warm and dry.  Neurological:     General: No focal deficit present.     Mental Status: He is alert and oriented to person, place, and time.  Psychiatric:        Mood and Affect: Mood normal.        Behavior: Behavior normal.      Diagnostic Tests:  Physicians  Panel Physicians Referring Physician Case Authorizing Physician  Paraschos, Alexander, MD (Primary)     Procedures  LEFT HEART CATH AND CORONARY ANGIOGRAPHY   Conclusion      Dist RCA lesion is 95% stenosed.   Prox RCA lesion is 20% stenosed.   RPDA lesion is 100% stenosed.   Ost LM  to Mid LM lesion is 70% stenosed.   Ost Cx to Prox Cx lesion is 95% stenosed.   Ost LAD to Prox LAD lesion is 85% stenosed.   Mid LAD lesion is 50% stenosed.   1st Diag lesion is 75% stenosed.   There is mild left ventricular systolic dysfunction.   The left ventricular ejection fraction is 45-50% by visual estimate.   1.  Severe three-vessel coronary artery disease, with 60% stenosis left main, 85% stenosis proximal LAD, 95% stenosis ostial/proximal left circumflex, 95% stenosis in distal RCA 2.  Mildly reduced left ventricular function  with estimate LVEF 45% with inferoapical and anteroapical hypokinesis   Recommendations   CABG   Indications  Abnormal stress test [R94.39 (ICD-10-CM)]   Procedural Details  Technical Details The right wrist was prepped and draped in usual sterile manner.  Anesthesia was obtained 1% lidocaine locally.  A hydrophilic 6 French sheath was introduced to right radial artery.  Left ventriculography and angiography of right coronary artery was performed with a JR4 diagnostic catheter.  Angiography of the left coronary artery was performed with a JL 4 diagnostic catheter.  Hemostasis was achieved with TR band with 9 cc of air.  The patient received 1 mg of Versed and 25 mcg of fentanyl.  He received 4000 units of heparin.  Total contrast was 63 cc.  There were no periprocedural complications. Estimated blood loss <50 mL.   During this procedure medications were administered to achieve and maintain moderate conscious sedation while the patient's heart rate, blood pressure, and oxygen saturation were continuously monitored and I was present face-to-face 100% of this time. April Traczyk RN and Isac Sarna Lab Personnel are independent, trained observers who assisted in the monitoring of the patient's level of consciousness.   Medications (Filter: Administrations occurring from 0730 to 0838 on 08/03/23)  important  Continuous medications are totaled by the amount  administered until 08/03/23 0838.   Heparin (Porcine) in NaCl 2000-0.9 UNIT/L-% SOLN (mL)  Total volume: 1,000 mL Date/Time Rate/Dose/Volume Action   08/03/23 0754 1,000 mL Given   fentaNYL (SUBLIMAZE) injection (mcg)  Total dose: 25 mcg Date/Time Rate/Dose/Volume Action   08/03/23 0803 25 mcg Given   midazolam (VERSED) injection (mg)  Total dose: 1 mg Date/Time Rate/Dose/Volume Action   08/03/23 0803 1 mg Given   lidocaine (PF) (XYLOCAINE) 1 % injection (mL)  Total volume: 2 mL Date/Time Rate/Dose/Volume Action   08/03/23 0803 2 mL Given   verapamil (ISOPTIN) injection (mg)  Total dose: 2.5 mg Date/Time Rate/Dose/Volume Action   08/03/23 0807 2.5 mg Given   heparin sodium (porcine) injection (Units)  Total dose: 4,000 Units Date/Time Rate/Dose/Volume Action   08/03/23 0818 4,000 Units Given   iohexol (OMNIPAQUE) 300 MG/ML solution (mL)  Total volume: 63 mL Date/Time Rate/Dose/Volume Action   08/03/23 0824 63 mL Given    Sedation Time  Sedation Time Physician-1: 23 minutes 22 seconds Contrast     Administrations occurring from 0730 to 0838 on 08/03/23:  Medication Name Total Dose  iohexol (OMNIPAQUE) 300 MG/ML solution 63 mL   Radiation/Fluoro  Fluoro time: 8.2 (min) DAP: 25.3 (Gycm2) Cumulative Air Kerma: 341 (mGy) Complications  Complications documented before study signed (08/03/2023  8:41 AM)   No complications were associated with this study.  Documented by Isac Sarna - 08/03/2023  8:26 AM     Coronary Findings  Diagnostic Dominance: Right Left Main  Ost LM to Mid LM lesion is 70% stenosed. The lesion is tubular.    Left Anterior Descending  Ost LAD to Prox LAD lesion is 85% stenosed. The lesion is moderately calcified.  Mid LAD lesion is 50% stenosed. The lesion is tubular.    First Diagonal Branch  1st Diag lesion is 75% stenosed.    Left Circumflex  Ost Cx to Prox Cx lesion is 95% stenosed. The lesion is discrete.    Right Coronary Artery   Prox RCA lesion is 20% stenosed. The lesion is segmental.  Dist RCA lesion is 95% stenosed. The lesion is tubular.    Right Posterior Descending Artery  RPDA lesion is  100% stenosed.    Intervention   No interventions have been documented.   Wall Motion     The following segments are hypokinetic: mid inferior, apical anterior and apical inferior.           Left Heart  Left Ventricle The left ventricular size is in the upper limits of normal. There is mild left ventricular systolic dysfunction. The left ventricular ejection fraction is 45-50% by visual estimate. There are LV function abnormalities.   Coronary Diagrams  Diagnostic Dominance: Right  Intervention   Implants   No implant documentation for this case.   Syngo Images   Show images for CARDIAC CATHETERIZATION Images on Long Term Storage   Show images for Adam, Frost to Procedure Log  Procedure Log    Hemo Data  Flowsheet Row Most Recent Value  AO Systolic Pressure 29 mmHg  AO Diastolic Pressure 29 mmHg  AO Mean 29 mmHg  LV Systolic Pressure 123 mmHg  LV Diastolic Pressure 1 mmHg  LV EDP 10 mmHg  Arterial Occlusion Pressure Extended Systolic Pressure 116 mmHg  Arterial Occlusion Pressure Extended Diastolic Pressure 47 mmHg  Arterial Occlusion Pressure Extended Mean Pressure 72 mmHg  Left Ventricular Apex Extended Systolic Pressure 122 mmHg  LVp Diastolic Pressure 6 mmHg  Left Ventricular Apex Extended EDP Pressure 37 mmHg   Impression:  This 83 year old gentleman has high-grade left main and severe three-vessel coronary disease with mildly decreased left ventricular systolic function with inferior hypokinesis and significant symptoms of exertional fatigue, shortness of breath, and chest pressure.  I agree that the best treatment is coronary artery bypass graft surgery.  I reviewed the catheterization images with the patient and his family and answered their questions. I discussed the  operative procedure with the patient and family including alternatives, benefits and risks; including but not limited to bleeding, blood transfusion, infection, stroke, myocardial infarction, graft failure, heart block requiring a permanent pacemaker, organ dysfunction, and death.  Adam Frost understands and agrees to proceed.    Plan:  He will be scheduled for coronary artery bypass graft surgery on Monday morning 08/08/2023.  I spent 45 minutes performing this consultation and > 50% of this time was spent face to face counseling and coordinating the care of this patient's severe multivessel coronary disease.  Alleen Borne, MD [facility] [phone]

## 2023-08-04 NOTE — Pre-Procedure Instructions (Signed)
 Surgical Instructions   Your procedure is scheduled on Monday, April 7th. Report to [facility] Main Entrance "A" at 05:30 A.M., then check in with the Admitting office. Any questions or running late day of surgery: call [phone]  Questions prior to your surgery date: call [phone], Monday-Friday, 8am-4pm. If you experience any cold or flu symptoms such as cough, fever, chills, shortness of breath, etc. between now and your scheduled surgery, please notify us at the above number.     Remember:  Do not eat or drink after midnight the night before your surgery     Take these medicines the morning of surgery with A SIP OF WATER  amLODipine (NORVASC)  atorvastatin (LIPITOR)  isosorbide mononitrate (IMDUR)  metoprolol succinate (TOPROL-XL)    One week prior to surgery, STOP taking any Aleve, Naproxen, Ibuprofen, Motrin, Advil, Goody's, BC's, all herbal medications, fish oil, and non-prescription vitamins.  WHAT DO I DO ABOUT MY DIABETES MEDICATION?   Do not take glipiZIDE-metformin (METAGLIP) the evening before surgery (4/6) OR the morning of surgery (4/7).    HOW TO MANAGE YOUR DIABETES BEFORE AND AFTER SURGERY  Why is it important to control my blood sugar before and after surgery? Improving blood sugar levels before and after surgery helps healing and can limit problems. A way of improving blood sugar control is eating a healthy diet by:  Eating less sugar and carbohydrates  Increasing activity/exercise  Talking with your doctor about reaching your blood sugar goals High blood sugars (greater than 180 mg/dL) can raise your risk of infections and slow your recovery, so you will need to focus on controlling your diabetes during the weeks before surgery. Make sure that the doctor who takes care of your diabetes knows about your planned surgery including the date and location.  How do I manage my blood sugar before surgery? Check your blood sugar at least 4 times a day,  starting 2 days before surgery, to make sure that the level is not too high or low.  Check your blood sugar the morning of your surgery when you wake up and every 2 hours until you get to the Short Stay unit.  If your blood sugar is less than 70 mg/dL, you will need to treat for low blood sugar: Do not take insulin. Treat a low blood sugar (less than 70 mg/dL) with  cup of clear juice (cranberry or apple), 4 glucose tablets, OR glucose gel. Recheck blood sugar in 15 minutes after treatment (to make sure it is greater than 70 mg/dL). If your blood sugar is not greater than 70 mg/dL on recheck, call [phone] for further instructions. Report your blood sugar to the short stay nurse when you get to Short Stay.  If you are admitted to the hospital after surgery: Your blood sugar will be checked by the staff and you will probably be given insulin after surgery (instead of oral diabetes medicines) to make sure you have good blood sugar levels. The goal for blood sugar control after surgery is 80-180 mg/dL.                     Do NOT Smoke (Tobacco/Vaping) for 24 hours prior to your procedure.  If you use a CPAP at night, you may bring your mask/headgear for your overnight stay.   You will be asked to remove any contacts, glasses, piercing's, hearing aid's, dentures/partials prior to surgery. Please bring cases for these items if needed.    Patients discharged the  day of surgery will not be allowed to drive home, and someone needs to stay with them for 24 hours.  SURGICAL WAITING ROOM VISITATION Patients may have no more than 2 support people in the waiting area - these visitors may rotate.   Pre-op nurse will coordinate an appropriate time for 1 ADULT support person, who may not rotate, to accompany patient in pre-op.  Children under the age of 19 must have an adult with them who is not the patient and must remain in the main waiting area with an adult.  If the patient needs to stay at the  hospital during part of their recovery, the visitor guidelines for inpatient rooms apply.  Please refer to the [facility] website for the visitor guidelines for any additional information.   If you received a COVID test during your pre-op visit  it is requested that you wear a mask when out in public, stay away from anyone that may not be feeling well and notify your surgeon if you develop symptoms. If you have been in contact with anyone that has tested positive in the last 10 days please notify you surgeon.      Pre-operative CHG Bathing Instructions   You can play a key role in reducing the risk of infection after surgery. Your skin needs to be as free of germs as possible. You can reduce the number of germs on your skin by washing with CHG (chlorhexidine gluconate) soap before surgery. CHG is an antiseptic soap that kills germs and continues to kill germs even after washing.   DO NOT use if you have an allergy to chlorhexidine/CHG or antibacterial soaps. If your skin becomes reddened or irritated, stop using the CHG and notify one of our RNs at [phone].              TAKE A SHOWER THE NIGHT BEFORE SURGERY AND THE DAY OF SURGERY    Please keep in mind the following:  DO NOT shave, including legs and underarms, 48 hours prior to surgery.   You may shave your face before/day of surgery.  Place clean sheets on your bed the night before surgery Use a clean washcloth (not used since being washed) for each shower. DO NOT sleep with pet's night before surgery.  CHG Shower Instructions:  Wash your face and private area with normal soap. If you choose to wash your hair, wash first with your normal shampoo.  After you use shampoo/soap, rinse your hair and body thoroughly to remove shampoo/soap residue.  Turn the water OFF and apply half the bottle of CHG soap to a CLEAN washcloth.  Apply CHG soap ONLY FROM YOUR NECK DOWN TO YOUR TOES (washing for 3-5 minutes)  DO NOT use CHG soap on face,  private areas, open wounds, or sores.  Pay special attention to the area where your surgery is being performed.  If you are having back surgery, having someone wash your back for you may be helpful. Wait 2 minutes after CHG soap is applied, then you may rinse off the CHG soap.  Pat dry with a clean towel  Put on clean pajamas    Additional instructions for the day of surgery: DO NOT APPLY any lotions, deodorants, cologne, or perfumes.   Do not wear jewelry or makeup Do not wear nail polish, gel polish, artificial nails, or any other type of covering on natural nails (fingers and toes) Do not bring valuables to the hospital. [facility] is not responsible for  valuables/personal belongings. Put on clean/comfortable clothes.  Please brush your teeth.  Ask your nurse before applying any prescription medications to the skin.

## 2023-08-05 ENCOUNTER — Ambulatory Visit (HOSPITAL_COMMUNITY)
Admission: RE | Admit: 2023-08-05 | Discharge: 2023-08-05 | Disposition: A | Discharge status: Home / Self Care | Source: Ambulatory Visit | Attending: Surgery | Admitting: Surgery

## 2023-08-05 ENCOUNTER — Other Ambulatory Visit: Payer: Self-pay

## 2023-08-05 ENCOUNTER — Encounter (HOSPITAL_COMMUNITY)
Admission: RE | Admit: 2023-08-05 | Discharge: 2023-08-05 | Disposition: A | Discharge status: Home / Self Care | Source: Ambulatory Visit | Attending: Surgery | Admitting: Surgery

## 2023-08-05 ENCOUNTER — Encounter (HOSPITAL_COMMUNITY): Payer: Self-pay

## 2023-08-05 VITALS — BP 126/62 | HR 56 | Temp 97.8°F | Resp 18 | Ht 70.5 in | Wt 169.8 lb

## 2023-08-05 DIAGNOSIS — Z01818 Encounter for other preprocedural examination: Secondary | ICD-10-CM

## 2023-08-05 DIAGNOSIS — I25119 Atherosclerotic heart disease of native coronary artery with unspecified angina pectoris: Secondary | ICD-10-CM | POA: Insufficient documentation

## 2023-08-05 HISTORY — DX: Basal cell carcinoma of skin, unspecified: C44.91

## 2023-08-05 HISTORY — DX: Atherosclerotic heart disease of native coronary artery without angina pectoris: I25.10

## 2023-08-05 HISTORY — DX: Unspecified osteoarthritis, unspecified site: M19.90

## 2023-08-05 LAB — URINALYSIS, ROUTINE W REFLEX MICROSCOPIC
Bilirubin Urine: NEGATIVE
Glucose, UA: >=500 mg/dL — AB
Hgb urine dipstick: NEGATIVE
Ketones, ur: NEGATIVE mg/dL
Leukocytes,Ua: NEGATIVE
Nitrite: NEGATIVE
Protein, ur: NEGATIVE mg/dL
Specific Gravity, Urine: 1.013 (ref 1.005–1.030)
pH: 5 (ref 5.0–8.0)

## 2023-08-05 LAB — CBC
HCT: 43.6 % (ref 39.0–52.0)
Hemoglobin: 14.2 g/dL (ref 13.0–17.0)
MCH: 31.1 pg (ref 26.0–34.0)
MCHC: 32.6 g/dL (ref 30.0–36.0)
MCV: 95.4 fL (ref 80.0–100.0)
Platelets: 179 10*3/uL (ref 150–400)
RBC: 4.57 MIL/uL (ref 4.22–5.81)
RDW: 13.5 % (ref 11.5–15.5)
WBC: 5.7 10*3/uL (ref 4.0–10.5)
nRBC: 0 % (ref 0.0–0.2)

## 2023-08-05 LAB — HEMOGLOBIN A1C
Hgb A1c MFr Bld: 6.8 % — ABNORMAL HIGH (ref 4.8–5.6)
Mean Plasma Glucose: 148.46 mg/dL

## 2023-08-05 LAB — PROTIME-INR
INR: 1.1 (ref 0.8–1.2)
Prothrombin Time: 14.1 s (ref 11.4–15.2)

## 2023-08-05 LAB — COMPREHENSIVE METABOLIC PANEL WITH GFR
ALT: 16 U/L (ref 0–44)
AST: 27 U/L (ref 15–41)
Albumin: 4.2 g/dL (ref 3.5–5.0)
Alkaline Phosphatase: 76 U/L (ref 38–126)
Anion gap: 14 (ref 5–15)
BUN: 28 mg/dL — ABNORMAL HIGH (ref 8–23)
CO2: 23 mmol/L (ref 22–32)
Calcium: 9.4 mg/dL (ref 8.9–10.3)
Chloride: 100 mmol/L (ref 98–111)
Creatinine, Ser: 1.8 mg/dL — ABNORMAL HIGH (ref 0.61–1.24)
GFR, Estimated: 37 mL/min — ABNORMAL LOW (ref >60)
Glucose, Bld: 192 mg/dL — ABNORMAL HIGH (ref 70–99)
Potassium: 3.5 mmol/L (ref 3.5–5.1)
Sodium: 137 mmol/L (ref 135–145)
Total Bilirubin: 0.9 mg/dL (ref 0.0–1.2)
Total Protein: 7.3 g/dL (ref 6.5–8.1)

## 2023-08-05 LAB — APTT: aPTT: 25 s (ref 24–36)

## 2023-08-05 LAB — GLUCOSE, CAPILLARY: Glucose-Capillary: 202 mg/dL — ABNORMAL HIGH (ref 70–99)

## 2023-08-05 LAB — SURGICAL PCR SCREEN
MRSA, PCR: NEGATIVE
Staphylococcus aureus: POSITIVE — AB

## 2023-08-05 MED ORDER — TRANEXAMIC ACID 1000 MG/10ML IV SOLN
1.5000 mg/kg/h | INTRAVENOUS | Status: AC
  Administered 2023-08-08: 1.5 mg/kg/h via INTRAVENOUS
  Filled 2023-08-05: qty 25

## 2023-08-05 MED ORDER — NOREPINEPHRINE 4 MG/250ML-% IV SOLN
0.0000 ug/min | INTRAVENOUS | Status: DC
  Filled 2023-08-05: qty 250

## 2023-08-05 MED ORDER — EPINEPHRINE HCL 5 MG/250ML IV SOLN IN NS
0.0000 ug/min | INTRAVENOUS | Status: DC
  Filled 2023-08-05: qty 250

## 2023-08-05 MED ORDER — TRANEXAMIC ACID (OHS) PUMP PRIME SOLUTION
2.0000 mg/kg | INTRAVENOUS | Status: DC
  Filled 2023-08-05: qty 1.54

## 2023-08-05 MED ORDER — TRANEXAMIC ACID (OHS) BOLUS VIA INFUSION
15.0000 mg/kg | INTRAVENOUS | Status: AC
  Administered 2023-08-08: 1155 mg via INTRAVENOUS
  Filled 2023-08-05: qty 1155

## 2023-08-05 MED ORDER — CEFAZOLIN SODIUM-DEXTROSE 2-4 GM/100ML-% IV SOLN
2.0000 g | INTRAVENOUS | Status: AC
  Administered 2023-08-08: 2 g via INTRAVENOUS
  Filled 2023-08-05: qty 100

## 2023-08-05 MED ORDER — INSULIN REGULAR(HUMAN) IN NACL 100-0.9 UT/100ML-% IV SOLN
INTRAVENOUS | Status: AC
  Administered 2023-08-08: 3.6 [IU]/h via INTRAVENOUS
  Filled 2023-08-05: qty 100

## 2023-08-05 MED ORDER — POTASSIUM CHLORIDE 2 MEQ/ML IV SOLN
80.0000 meq | INTRAVENOUS | Status: DC
  Filled 2023-08-05: qty 40

## 2023-08-05 MED ORDER — PHENYLEPHRINE HCL-NACL 20-0.9 MG/250ML-% IV SOLN
30.0000 ug/min | INTRAVENOUS | Status: DC
  Filled 2023-08-05: qty 250

## 2023-08-05 MED ORDER — MAGNESIUM SULFATE 50 % IJ SOLN
40.0000 meq | INTRAMUSCULAR | Status: DC
  Filled 2023-08-05: qty 9.85

## 2023-08-05 MED ORDER — VANCOMYCIN HCL 1250 MG/250ML IV SOLN
1250.0000 mg | INTRAVENOUS | Status: DC
  Filled 2023-08-05: qty 250

## 2023-08-05 MED ORDER — NITROGLYCERIN IN D5W 200-5 MCG/ML-% IV SOLN
2.0000 ug/min | INTRAVENOUS | Status: DC
  Filled 2023-08-05: qty 250

## 2023-08-05 MED ORDER — HEPARIN 30,000 UNITS/1000 ML (OHS) CELLSAVER SOLUTION
Status: DC
  Filled 2023-08-05: qty 1000

## 2023-08-05 MED ORDER — PLASMA-LYTE A IV SOLN
INTRAVENOUS | Status: DC
  Filled 2023-08-05: qty 2.5

## 2023-08-05 MED ORDER — MILRINONE LACTATE IN DEXTROSE 20-5 MG/100ML-% IV SOLN
0.3000 ug/kg/min | INTRAVENOUS | Status: DC
  Filled 2023-08-05: qty 100

## 2023-08-05 MED ORDER — DEXMEDETOMIDINE HCL IN NACL 400 MCG/100ML IV SOLN
0.1000 ug/kg/h | INTRAVENOUS | Status: AC
  Administered 2023-08-08: .4 ug/kg/h via INTRAVENOUS
  Filled 2023-08-05: qty 100

## 2023-08-05 NOTE — Progress Notes (Signed)
 PCP - Dr. Kandyce Rud Cardiologist - Dr. Windell Norfolk  PPM/ICD - denies   Chest x-ray - 08/05/23 EKG - 08/03/23 Stress Test - 07/07/23 ECHO - 08/04/23 Cardiac Cath - 08/03/23  Sleep Study - denies   DM- pt does not check CBG at home and does not know typical fasting levels  Last dose of GLP1 agonist-  n/a   Blood Thinner Instructions: n/a Aspirin Instructions: hold ASA DOS  ERAS Protcol - no, NPO   COVID TEST- n/a   Anesthesia review: yes, cardiac hx  Patient denies shortness of breath, fever, cough and chest pain at PAT appointment   All instructions explained to the patient, with a verbal understanding of the material. Patient agrees to go over the instructions while at home for a better understanding.  The opportunity to ask questions was provided.

## 2023-08-05 NOTE — Anesthesia Preprocedure Evaluation (Addendum)
 Anesthesia Evaluation  Patient identified by MRN, date of birth, ID band Patient awake    Reviewed: Allergy & Precautions, H&P , NPO status , Patient's Chart, lab work & pertinent test results  Airway Mallampati: II  TM Distance: >3 FB Neck ROM: Full    Dental no notable dental hx. (+) Teeth Intact, Dental Advisory Given   Pulmonary neg pulmonary ROS   Pulmonary exam normal breath sounds clear to auscultation       Cardiovascular Exercise Tolerance: Good hypertension, Pt. on medications and Pt. on home beta blockers + CAD   Rhythm:Regular Rate:Normal     Neuro/Psych negative neurological ROS  negative psych ROS   GI/Hepatic negative GI ROS, Neg liver ROS,,,  Endo/Other  diabetes, Type 2, Oral Hypoglycemic Agents    Renal/GU negative Renal ROS  negative genitourinary   Musculoskeletal  (+) Arthritis , Osteoarthritis,    Abdominal   Peds  Hematology negative hematology ROS (+)   Anesthesia Other Findings   Reproductive/Obstetrics negative OB ROS                             Anesthesia Physical Anesthesia Plan  ASA: 4  Anesthesia Plan: General   Post-op Pain Management: Tylenol PO (pre-op)*   Induction: Intravenous  PONV Risk Score and Plan: 2 and Ondansetron and Dexamethasone  Airway Management Planned: Oral ETT  Additional Equipment: Arterial line, CVP, PA Cath, TEE and Ultrasound Guidance Line Placement  Intra-op Plan:   Post-operative Plan: Post-operative intubation/ventilation  Informed Consent: I have reviewed the patients History and Physical, chart, labs and discussed the procedure including the risks, benefits and alternatives for the proposed anesthesia with the patient or authorized representative who has indicated his/her understanding and acceptance.     Dental advisory given  Plan Discussed with: CRNA  Anesthesia Plan Comments: (PAT note written 08/05/2023 by  Shonna Chock, PA-C.  )       Anesthesia Quick Evaluation

## 2023-08-05 NOTE — Progress Notes (Signed)
 Anesthesia Chart Review:  Case: [id number] Date/Time: 08/08/23 0715   Procedures:      CORONARY ARTERY BYPASS GRAFTING (CABG) (Chest)     ECHOCARDIOGRAM, TRANSESOPHAGEAL   Anesthesia type: General   Diagnosis: Coronary artery disease involving native coronary artery of native heart with angina pectoris (HCC) [I25.119]   Pre-op diagnosis: CAD   [facility]OM [facility] / [facility]   Surgeons: Alleen Borne, MD       DISCUSSION: Patient is an 83 year old Frost scheduled for the above procedure.  History includes never smoker, HTN, HLD, CAD, DM2, skin cancer (BCC excision), BPH. Labs trends suggest CKD (stage 3).   Creatinine 1.80, up from 1.46 on 08/03/23. Will need monitoring post-operatively.   Anesthesia team to evaluate on the day of surgery.    VS: BP 126/62   Pulse (!) 56   Temp 36.6 C   Resp 18   Ht 5' 10.5" (1.791 m)   Wt 77 kg   SpO2 99%   BMI 24.02 kg/m   PROVIDERS: Kandyce Rud, MD is PCP  Windell Norfolk, MD is cardiologist   LABS: Preoperative labs noted.  (all labs ordered are listed, but only abnormal results are displayed)  Labs Reviewed  SURGICAL PCR SCREEN - Abnormal; Notable for the following components:      Result Value   Staphylococcus aureus POSITIVE (*)    All other components within normal limits  GLUCOSE, CAPILLARY - Abnormal; Notable for the following components:   Glucose-Capillary 202 (*)    All other components within normal limits  COMPREHENSIVE METABOLIC PANEL WITH GFR - Abnormal; Notable for the following components:   Glucose, Bld 192 (*)    BUN 28 (*)    Creatinine, Ser 1.80 (*)    GFR, Estimated Adam (*)    All other components within normal limits  URINALYSIS, ROUTINE W REFLEX MICROSCOPIC - Abnormal; Notable for the following components:   Glucose, UA >=500 (*)    Bacteria, UA RARE (*)    All other components within normal limits  HEMOGLOBIN A1C - Abnormal; Notable for the following components:   Hgb A1c MFr Bld 6.8 (*)    All  other components within normal limits  CBC  PROTIME-INR  APTT  TYPE AND SCREEN   08/05/23: BUN 28, Cr 1.80, eGFR Adam 08/03/23: BUN 27, Cr 1.46, eGFR 47 06/27/23: BUN 27, Cr 1.3, eGFR 54 12/14/22: BUN 27, Cr 1.3, eGFR 55  TSH 0.097 on 06/27/23 ([facility]). Patient asymptomatic, so Dr. Larwance Sachs monitoring for now.    IMAGES: CXR 08/05/23: FINDINGS: The heart is normal in size.The cardiomediastinal contours are stable. The lungs are clear. Mild chronic elevation of right hemidiaphragm. Pulmonary vasculature is normal. No consolidation, pleural effusion, or pneumothorax. No acute osseous abnormalities are seen. IMPRESSION: No active cardiopulmonary disease.  MRI L-spine 10/29/22: IMPRESSION: 1. Multilevel lumbar spondylosis as described. No acute osseous findings. 2. 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 3. Moderate multifactorial spinal stenosis at L4-5 with asymmetric narrowing of the left lateral recess. 4. Small central disc protrusion at L5-S1 with mild mass effect on the thecal sac and S1 nerve roots.    EKG: 08/03/23:  Sinus rhythm Abnormal R-wave progression, early transition Minimal ST depression, anterior leads Confirmed by Marcina Millard ([id number]) on 08/03/2023 4:17:46 PM   CV: US Carotid 08/05/23 (PRELIMINARY): Summary:  - Right Carotid: Velocities in the right ICA are consistent  with a 1-39% stenosis.  - Left Carotid: Velocities in the left ICA are consistent with a 1-39% stenosis.  - Vertebrals: Bilateral vertebral arteries demonstrate antegrade flow.  - Subclavians: Normal flow hemodynamics were seen in bilateral subclavian arteries.    Echo 08/04/22 ([facility]): Conclusion: NORMAL LEFT VENTRICULAR SYSTOLIC FUNCTION WITH NO LVH  ESTIMATED EF: 50%, CALC EF(2D): 50%  NORMAL LA PRESSURES WITH DIASTOLIC DYSFUNCTION (GRADE 1)  NORMAL RIGHT VENTRICULAR SYSTOLIC FUNCTION   VALVULAR REGURGITATION: No AR, TRIVIAL MR, TRIVIAL PR, TRIVIAL TR  NO VALVULAR STENOSIS  - Low normal LV systolic function, LVEF 50-55%.    Cardiac cath 08/03/23:   Dist RCA lesion is 95% stenosed.   Prox RCA lesion is 20% stenosed.   RPDA lesion is 100% stenosed.   Ost LM to Mid LM lesion is 70% stenosed.   Ost Cx to Prox Cx lesion is 95% stenosed.   Ost LAD to Prox LAD lesion is 85% stenosed.   Mid LAD lesion is 50% stenosed.   1st Diag lesion is 75% stenosed.   There is mild left ventricular systolic dysfunction.   The left ventricular ejection fraction is 45-50% by visual estimate.   1.  Severe three-vessel coronary artery disease, with 60% stenosis left main, 85% stenosis proximal LAD, 95% stenosis ostial/proximal left circumflex, 95% stenosis in distal RCA 2.  Mildly reduced left ventricular function with estimate LVEF 45% with inferoapical and anteroapical hypokinesis   Recommendations  CABG   Past Medical History:  Diagnosis Date   Abdominal hernia    Arthritis    Basal cell carcinoma    left side of face x3   Coronary artery disease    Diabetes mellitus without complication (HCC)    Hemorrhoid    Hyperlipidemia    Hypertension     Past Surgical History:  Procedure Laterality Date   BASAL CELL CARCINOMA EXCISION     x3 (left side of face)   COLONOSCOPY     COLONOSCOPY WITH PROPOFOL N/A 04/17/2015   Procedure: COLONOSCOPY WITH PROPOFOL;  Surgeon: Wallace Cullens, MD;  Location[facility] ENDOSCOPY;  Service: Gastroenterology;  Laterality: N/A;   HERNIA REPAIR Left    inguinal   HYDROCELE EXCISION / REPAIR     LEFT HEART CATH AND CORONARY ANGIOGRAPHY Left 08/03/2023   Procedure: LEFT HEART CATH AND CORONARY ANGIOGRAPHY;  Surgeon: Marcina Millard, MD;  Location: [facility] INVASIVE CV LAB;  Service: Cardiovascular;  Laterality: Left;    MEDICATIONS:  amLODipine (NORVASC) 5 MG tablet   aspirin EC 81 MG tablet   atorvastatin (LIPITOR) 40 MG tablet   benazepril (LOTENSIN)  40 MG tablet   glipiZIDE-metformin (METAGLIP) 2.5-500 MG tablet   hydrochlorothiazide (HYDRODIURIL) 12.5 MG tablet   isosorbide mononitrate (IMDUR) 30 MG 24 hr tablet   metoprolol succinate (TOPROL-XL) 25 MG 24 hr tablet   OVER THE COUNTER MEDICATION   triamcinolone cream (KENALOG) 0.1 %   No current facility-administered medications for this encounter.    [START ON 08/08/2023] ceFAZolin (ANCEF) IVPB 2g/100 mL premix   [START ON 08/08/2023] ceFAZolin (ANCEF) IVPB 2g/100 mL premix   [START ON 08/08/2023] dexmedetomidine (PRECEDEX) 400 MCG/100ML (4 mcg/mL) infusion   [START ON 08/08/2023] EPINEPHrine (ADRENALIN) 5 mg in NS 250 mL (0.02 mg/mL) premix infusion   [START ON 08/08/2023] heparin 30,000 units/NS 1000 mL solution for CELLSAVER   [START ON 08/08/2023] heparin sodium (porcine) 2,500 Units, papaverine 30 mg in electrolyte-A (PLASMALYTE-A PH 7.4) 500 mL irrigation   [START ON 08/08/2023] insulin regular,  human (MYXREDLIN) 100 units/ 100 mL infusion   [START ON 08/08/2023] magnesium sulfate (IV Push/IM) injection 40 mEq   [START ON 08/08/2023] milrinone (PRIMACOR) 20 MG/100 ML (0.2 mg/mL) infusion   [START ON 08/08/2023] nitroGLYCERIN 50 mg in dextrose 5 % 250 mL (0.2 mg/mL) infusion   [START ON 08/08/2023] norepinephrine (LEVOPHED) 4mg  in (0.016 mg/mL) premix infusion   [START ON 08/08/2023] phenylephrine (NEO-SYNEPHRINE) 20mg /NS premix infusion   [START ON 08/08/2023] potassium chloride injection 80 mEq   [START ON 08/08/2023] tranexamic acid (CYKLOKAPRON) 2,500 mg in sodium chloride 0.9 % 250 mL (10 mg/mL) infusion   [START ON 08/08/2023] tranexamic acid (CYKLOKAPRON) bolus via infusion - over 30 minutes 1,155 mg   [START ON 08/08/2023] tranexamic acid (CYKLOKAPRON) pump prime solution 154 mg   [START ON 08/08/2023] vancomycin (VANCOREADY) IVPB 1250 mg/250 mL    Shonna Chock, PA-C Surgical Short Stay/Anesthesiology [facility] Phone [phone] [facility] Phone [phone] 08/05/2023 4:23 PM

## 2023-08-07 NOTE — H&P (Signed)
 [address]       [place] [address]             [phone]      Cardiothoracic Surgery Admission History and Physical   PCP is Kandyce Rud, MD Referring Provider is Marcina Millard, MD       Chief Complaint  Patient presents with   Coronary Artery Disease          HPI:   The patient is an 83 year old active gentleman with a history of type 2 diabetes, hypertension, and hyperlipidemia who presented with a 2-month history of progressive exertional shortness of breath and fatigue as well as occasional episodes of substernal chest tightness.  He underwent a stress test showing inferior ST changes without any symptoms.  A cardiac CT showed a coronary calcium score of 1681 which was 75th percentile.  It suggested a high-grade stenosis in the left main coronary artery.  CT FFR across the left main was 0.74.  The distal RCA was 0.73.  A 2D echocardiogram showed an ejection fraction of 50% with inferior hypokinesis and no significant valvular abnormality.  Cardiac catheterization yesterday by Dr. Darrold Junker showed a diffusely diseased left main with 70% stenosis.  The proximal LAD had a long area of 85% stenosis.  There is about 50% mid LAD stenosis.  There was a small first diagonal branch with 75% stenosis.  The left circumflex had 95% proximal stenosis.  The RCA had a 95% distal stenosis before the posterior descending branch which was completely occluded and filled very faintly by collaterals.  There was a large posterolateral branch.  Left ventricular ejection fraction was 45%-50% visually with an LVEDP of 10.   The patient lives at home with his wife.  His wife, daughter and son are with him today. He stays active mowing his grass and working in his yard.  He is a lifelong non-smoker.     Past Medical History:  Diagnosis Date   Abdominal hernia     Diabetes mellitus without complication (HCC)     Hemorrhoid     Hyperlipidemia     Hypertension                  Past Surgical History:  Procedure Laterality Date   COLONOSCOPY       COLONOSCOPY WITH PROPOFOL N/A 04/17/2015    Procedure: COLONOSCOPY WITH PROPOFOL;  Surgeon: Wallace Cullens, MD;  Location: [facility] ENDOSCOPY;  Service: Gastroenterology;  Laterality: N/A;   HERNIA REPAIR       HYDROCELE EXCISION / REPAIR       LEFT HEART CATH AND CORONARY ANGIOGRAPHY Left 08/03/2023    Procedure: LEFT HEART CATH AND CORONARY ANGIOGRAPHY;  Surgeon: Marcina Millard, MD;  Location: [facility] INVASIVE CV LAB;  Service: Cardiovascular;  Laterality: Left;               Family History  Problem Relation Age of Onset   Heart disease Mother     Colon cancer Father     Colon cancer Brother            Social History Social History  Social History        Tobacco Use   Smoking status: Never   Smokeless tobacco: Never  Vaping Use   Vaping status: Never Used  Substance Use Topics   Alcohol use: No   Drug use: No              Current Outpatient  Medications  Medication Sig Dispense Refill   amLODipine (NORVASC) 5 MG tablet Take 1 tablet (5 mg total) by mouth daily. 90 tablet 4   aspirin EC 81 MG tablet Take 81 mg by mouth daily.       atorvastatin (LIPITOR) 40 MG tablet Take 1 tablet (40 mg total) by mouth daily. 90 tablet 4   benazepril (LOTENSIN) 40 MG tablet Take 40 mg by mouth daily.       glipiZIDE-metformin (METAGLIP) 2.5-500 MG tablet Take 2 tablets by mouth 2 (two) times daily before a meal. 360 tablet 4   hydrochlorothiazide (HYDRODIURIL) 12.5 MG tablet Take 1 tablet (12.5 mg total) by mouth daily. 90 tablet 0   isosorbide mononitrate (IMDUR) 30 MG 24 hr tablet Take 30 mg by mouth daily.       metoprolol succinate (TOPROL-XL) 25 MG 24 hr tablet Take 25 mg by mouth daily.       OVER THE COUNTER MEDICATION Take 2 tablets by mouth daily. VitaFusion - Omega 3/EPA/DHEA/ALA       triamcinolone cream (KENALOG) 0.1 % Apply 1 application topically 2 (two) times daily as needed (rash).    2       No current facility-administered medications for this visit.        Allergies  No Known Allergies     Review of Systems  Constitutional:  Positive for activity change, fatigue and unexpected weight change.  HENT: Negative.    Eyes: Negative.   Respiratory:  Positive for shortness of breath.   Cardiovascular:  Positive for chest pain and leg swelling.  Gastrointestinal: Negative.   Endocrine: Negative.   Genitourinary: Negative.   Musculoskeletal: Negative.   Skin: Negative.   Allergic/Immunologic: Negative.   Neurological:  Negative for dizziness and syncope.  Hematological: Negative.   Psychiatric/Behavioral: Negative.        Ht 5' 10.5" (1.791 m)   BMI 23.89 kg/m  Physical Exam Constitutional:      Appearance: Normal appearance.  HENT:     Head: Normocephalic and atraumatic.  Eyes:     Extraocular Movements: Extraocular movements intact.     Conjunctiva/sclera: Conjunctivae normal.     Pupils: Pupils are equal, round, and reactive to light.  Neck:     Vascular: No carotid bruit.  Cardiovascular:     Rate and Rhythm: Normal rate and regular rhythm.     Pulses: Normal pulses.     Heart sounds: Normal heart sounds. No murmur heard. Pulmonary:     Effort: Pulmonary effort is normal.     Breath sounds: Normal breath sounds.  Abdominal:     General: There is no distension.     Tenderness: There is no abdominal tenderness.  Musculoskeletal:        General: No swelling.     Cervical back: Normal range of motion and neck supple.     Comments: Bilateral lower leg varicose veins.  Skin:    General: Skin is warm and dry.  Neurological:     General: No focal deficit present.     Mental Status: He is alert and oriented to person, place, and time.  Psychiatric:        Mood and Affect: Mood normal.        Behavior: Behavior normal.          Diagnostic Tests:   Physicians   Panel Physicians Referring Physician Case Authorizing Physician  Marcina Millard,  MD (Primary)        Procedures  LEFT HEART CATH AND CORONARY ANGIOGRAPHY    Conclusion       Dist RCA lesion is 95% stenosed.   Prox RCA lesion is 20% stenosed.   RPDA lesion is 100% stenosed.   Ost LM to Mid LM lesion is 70% stenosed.   Ost Cx to Prox Cx lesion is 95% stenosed.   Ost LAD to Prox LAD lesion is 85% stenosed.   Mid LAD lesion is 50% stenosed.   1st Diag lesion is 75% stenosed.   There is mild left ventricular systolic dysfunction.   The left ventricular ejection fraction is 45-50% by visual estimate.   1.  Severe three-vessel coronary artery disease, with 60% stenosis left main, 85% stenosis proximal LAD, 95% stenosis ostial/proximal left circumflex, 95% stenosis in distal RCA 2.  Mildly reduced left ventricular function with estimate LVEF 45% with inferoapical and anteroapical hypokinesis   Recommendations   CABG   Indications   Abnormal stress test [R94.39 (ICD-10-CM)]    Procedural Details   Technical Details The right wrist was prepped and draped in usual sterile manner.  Anesthesia was obtained 1% lidocaine locally.  A hydrophilic 6 French sheath was introduced to right radial artery.  Left ventriculography and angiography of right coronary artery was performed with a JR4 diagnostic catheter.  Angiography of the left coronary artery was performed with a JL 4 diagnostic catheter.  Hemostasis was achieved with TR band with 9 cc of air.  The patient received 1 mg of Versed and 25 mcg of fentanyl.  He received 4000 units of heparin.  Total contrast was 63 cc.  There were no periprocedural complications. Estimated blood loss <50 mL.   During this procedure medications were administered to achieve and maintain moderate conscious sedation while the patient's heart rate, blood pressure, and oxygen saturation were continuously monitored and I was present face-to-face 100% of this time. April Traczyk RN and Isac Sarna Lab Personnel are independent, trained observers  who assisted in the monitoring of the patient's level of consciousness.    Medications (Filter: Administrations occurring from 0730 to 0838 on 08/03/23)  important  Continuous medications are totaled by the amount administered until 08/03/23 0838.    Heparin (Porcine) in NaCl 2000-0.9 UNIT/L-% SOLN (mL)  Total volume: 1,000 mL Date/Time Rate/Dose/Volume Action    08/03/23 0754 1,000 mL Given    fentaNYL (SUBLIMAZE) injection (mcg)  Total dose: 25 mcg Date/Time Rate/Dose/Volume Action    08/03/23 0803 25 mcg Given    midazolam (VERSED) injection (mg)  Total dose: 1 mg Date/Time Rate/Dose/Volume Action    08/03/23 0803 1 mg Given    lidocaine (PF) (XYLOCAINE) 1 % injection (mL)  Total volume: 2 mL Date/Time Rate/Dose/Volume Action    08/03/23 0803 2 mL Given    verapamil (ISOPTIN) injection (mg)  Total dose: 2.5 mg Date/Time Rate/Dose/Volume Action    08/03/23 0807 2.5 mg Given    heparin sodium (porcine) injection (Units)  Total dose: 4,000 Units Date/Time Rate/Dose/Volume Action    08/03/23 0818 4,000 Units Given    iohexol (OMNIPAQUE) 300 MG/ML solution (mL)  Total volume: 63 mL Date/Time Rate/Dose/Volume Action    08/03/23 0824 63 mL Given      Sedation Time   Sedation Time Physician-1: 23 minutes 22 seconds Contrast        Administrations occurring from 0730 to 0838 on 08/03/23:  Medication Name Total Dose  iohexol (OMNIPAQUE) 300 MG/ML solution 63 mL    Radiation/Fluoro   Fluoro time: 8.2 (min)  DAP: 25.3 (Gycm2) Cumulative Air Kerma: 341 (mGy) Complications   Complications documented before study signed (08/03/2023  8:41 AM)    No complications were associated with this study.  Documented by Isac Sarna - 08/03/2023  8:26 AM      Coronary Findings   Diagnostic Dominance: Right Left Main  Ost LM to Mid LM lesion is 70% stenosed. The lesion is tubular.    Left Anterior Descending  Ost LAD to Prox LAD lesion is 85% stenosed. The lesion is moderately  calcified.  Mid LAD lesion is 50% stenosed. The lesion is tubular.    First Diagonal Branch  1st Diag lesion is 75% stenosed.    Left Circumflex  Ost Cx to Prox Cx lesion is 95% stenosed. The lesion is discrete.    Right Coronary Artery  Prox RCA lesion is 20% stenosed. The lesion is segmental.  Dist RCA lesion is 95% stenosed. The lesion is tubular.    Right Posterior Descending Artery  RPDA lesion is 100% stenosed.    Intervention    No interventions have been documented.    Wall Motion       The following segments are hypokinetic: mid inferior, apical anterior and apical inferior.             Left Heart   Left Ventricle The left ventricular size is in the upper limits of normal. There is mild left ventricular systolic dysfunction. The left ventricular ejection fraction is 45-50% by visual estimate. There are LV function abnormalities.    Coronary Diagrams   Diagnostic Dominance: Right  Intervention    Implants    No implant documentation for this case.    Syngo Images    Show images for CARDIAC CATHETERIZATION Images on Long Term Storage    Show images for Gael, Londo to Procedure Log   Procedure Log    Hemo Data   Flowsheet Row Most Recent Value  AO Systolic Pressure 29 mmHg  AO Diastolic Pressure 29 mmHg  AO Mean 29 mmHg  LV Systolic Pressure 123 mmHg  LV Diastolic Pressure 1 mmHg  LV EDP 10 mmHg  Arterial Occlusion Pressure Extended Systolic Pressure 116 mmHg  Arterial Occlusion Pressure Extended Diastolic Pressure 47 mmHg  Arterial Occlusion Pressure Extended Mean Pressure 72 mmHg  Left Ventricular Apex Extended Systolic Pressure 122 mmHg  LVp Diastolic Pressure 6 mmHg  Left Ventricular Apex Extended EDP Pressure 37 mmHg    Impression:   This 83 year old gentleman has high-grade left main and severe three-vessel coronary disease with mildly decreased left ventricular systolic function with inferior hypokinesis and significant  symptoms of exertional fatigue, shortness of breath, and chest pressure.  I agree that the best treatment is coronary artery bypass graft surgery.  I reviewed the catheterization images with the patient and his family and answered their questions. I discussed the operative procedure with the patient and family including alternatives, benefits and risks; including but not limited to bleeding, blood transfusion, infection, stroke, myocardial infarction, graft failure, heart block requiring a permanent pacemaker, organ dysfunction, and death.  [name] understands and agrees to proceed.     Plan:   Coronary artery bypass graft surgery.    Alleen Borne, MD [facility] [phone]

## 2023-08-08 ENCOUNTER — Inpatient Hospital Stay (HOSPITAL_COMMUNITY): Payer: Self-pay | Admitting: Certified Registered"

## 2023-08-08 ENCOUNTER — Inpatient Hospital Stay (HOSPITAL_COMMUNITY)

## 2023-08-08 ENCOUNTER — Encounter (HOSPITAL_COMMUNITY): Payer: Self-pay | Admitting: Surgery

## 2023-08-08 ENCOUNTER — Inpatient Hospital Stay (HOSPITAL_COMMUNITY)
Admission: RE | Disposition: A | Discharge status: Home / Self Care | Payer: Self-pay | Source: Home / Self Care | Attending: Surgery

## 2023-08-08 ENCOUNTER — Inpatient Hospital Stay (HOSPITAL_COMMUNITY)
Admission: RE | Admit: 2023-08-08 | Discharge: 2023-08-12 | DRG: 236 | Disposition: A | Discharge status: Home / Self Care | Attending: Surgery | Admitting: Surgery

## 2023-08-08 ENCOUNTER — Other Ambulatory Visit: Payer: Self-pay

## 2023-08-08 DIAGNOSIS — I8393 Asymptomatic varicose veins of bilateral lower extremities: Secondary | ICD-10-CM | POA: Diagnosis present

## 2023-08-08 DIAGNOSIS — I25119 Atherosclerotic heart disease of native coronary artery with unspecified angina pectoris: Secondary | ICD-10-CM | POA: Diagnosis present

## 2023-08-08 DIAGNOSIS — Z7984 Long term (current) use of oral hypoglycemic drugs: Secondary | ICD-10-CM

## 2023-08-08 DIAGNOSIS — J9811 Atelectasis: Secondary | ICD-10-CM | POA: Diagnosis present

## 2023-08-08 DIAGNOSIS — N400 Enlarged prostate without lower urinary tract symptoms: Secondary | ICD-10-CM | POA: Diagnosis present

## 2023-08-08 DIAGNOSIS — D696 Thrombocytopenia, unspecified: Secondary | ICD-10-CM | POA: Diagnosis not present

## 2023-08-08 DIAGNOSIS — E119 Type 2 diabetes mellitus without complications: Secondary | ICD-10-CM | POA: Diagnosis present

## 2023-08-08 DIAGNOSIS — Z7982 Long term (current) use of aspirin: Secondary | ICD-10-CM

## 2023-08-08 DIAGNOSIS — Z951 Presence of aortocoronary bypass graft: Principal | ICD-10-CM

## 2023-08-08 DIAGNOSIS — I10 Essential (primary) hypertension: Secondary | ICD-10-CM | POA: Diagnosis present

## 2023-08-08 DIAGNOSIS — D62 Acute posthemorrhagic anemia: Secondary | ICD-10-CM | POA: Diagnosis not present

## 2023-08-08 DIAGNOSIS — I2582 Chronic total occlusion of coronary artery: Secondary | ICD-10-CM | POA: Diagnosis present

## 2023-08-08 DIAGNOSIS — Z8249 Family history of ischemic heart disease and other diseases of the circulatory system: Secondary | ICD-10-CM

## 2023-08-08 DIAGNOSIS — I2510 Atherosclerotic heart disease of native coronary artery without angina pectoris: Secondary | ICD-10-CM | POA: Diagnosis not present

## 2023-08-08 DIAGNOSIS — I493 Ventricular premature depolarization: Secondary | ICD-10-CM | POA: Diagnosis not present

## 2023-08-08 DIAGNOSIS — Z85828 Personal history of other malignant neoplasm of skin: Secondary | ICD-10-CM | POA: Diagnosis not present

## 2023-08-08 DIAGNOSIS — Z800 Family history of malignant neoplasm of digestive organs: Secondary | ICD-10-CM | POA: Diagnosis not present

## 2023-08-08 DIAGNOSIS — Z9889 Other specified postprocedural states: Secondary | ICD-10-CM | POA: Diagnosis not present

## 2023-08-08 DIAGNOSIS — Z01818 Encounter for other preprocedural examination: Secondary | ICD-10-CM | POA: Diagnosis not present

## 2023-08-08 DIAGNOSIS — Z79899 Other long term (current) drug therapy: Secondary | ICD-10-CM | POA: Diagnosis not present

## 2023-08-08 DIAGNOSIS — E785 Hyperlipidemia, unspecified: Secondary | ICD-10-CM | POA: Diagnosis present

## 2023-08-08 DIAGNOSIS — M1990 Unspecified osteoarthritis, unspecified site: Secondary | ICD-10-CM | POA: Diagnosis present

## 2023-08-08 HISTORY — PX: CORONARY ARTERY BYPASS GRAFT: SHX141

## 2023-08-08 HISTORY — PX: TEE WITHOUT CARDIOVERSION: SHX5443

## 2023-08-08 LAB — GLUCOSE, CAPILLARY
Glucose-Capillary: 188 mg/dL — ABNORMAL HIGH (ref 70–99)
Glucose-Capillary: 175 mg/dL — ABNORMAL HIGH (ref 70–99)
Glucose-Capillary: 114 mg/dL — ABNORMAL HIGH (ref 70–99)
Glucose-Capillary: 103 mg/dL — ABNORMAL HIGH (ref 70–99)
Glucose-Capillary: 103 mg/dL — ABNORMAL HIGH (ref 70–99)
Glucose-Capillary: 92 mg/dL (ref 70–99)
Glucose-Capillary: 149 mg/dL — ABNORMAL HIGH (ref 70–99)
Glucose-Capillary: 170 mg/dL — ABNORMAL HIGH (ref 70–99)
Glucose-Capillary: 179 mg/dL — ABNORMAL HIGH (ref 70–99)
Glucose-Capillary: 170 mg/dL — ABNORMAL HIGH (ref 70–99)
Glucose-Capillary: 176 mg/dL — ABNORMAL HIGH (ref 70–99)

## 2023-08-08 LAB — POCT I-STAT 7, (LYTES, BLD GAS, ICA,H+H)
Acid-Base Excess: 0 mmol/L (ref 0.0–2.0)
Acid-base deficit: 6 mmol/L — ABNORMAL HIGH (ref 0.0–2.0)
Acid-base deficit: 5 mmol/L — ABNORMAL HIGH (ref 0.0–2.0)
Acid-base deficit: 1 mmol/L (ref 0.0–2.0)
Acid-base deficit: 3 mmol/L — ABNORMAL HIGH (ref 0.0–2.0)
Acid-base deficit: 3 mmol/L — ABNORMAL HIGH (ref 0.0–2.0)
Acid-base deficit: 5 mmol/L — ABNORMAL HIGH (ref 0.0–2.0)
Bicarbonate: 18 mmol/L — ABNORMAL LOW (ref 20.0–28.0)
Bicarbonate: 21.3 mmol/L (ref 20.0–28.0)
Bicarbonate: 24 mmol/L (ref 20.0–28.0)
Bicarbonate: 24.1 mmol/L (ref 20.0–28.0)
Bicarbonate: 22.1 mmol/L (ref 20.0–28.0)
Bicarbonate: 22.4 mmol/L (ref 20.0–28.0)
Bicarbonate: 20.4 mmol/L (ref 20.0–28.0)
Calcium, Ion: 1.23 mmol/L (ref 1.15–1.40)
Calcium, Ion: 1.08 mmol/L — ABNORMAL LOW (ref 1.15–1.40)
Calcium, Ion: 1.05 mmol/L — ABNORMAL LOW (ref 1.15–1.40)
Calcium, Ion: 1.04 mmol/L — ABNORMAL LOW (ref 1.15–1.40)
Calcium, Ion: 1.02 mmol/L — ABNORMAL LOW (ref 1.15–1.40)
Calcium, Ion: 1.09 mmol/L — ABNORMAL LOW (ref 1.15–1.40)
Calcium, Ion: 1.11 mmol/L — ABNORMAL LOW (ref 1.15–1.40)
HCT: 36 % — ABNORMAL LOW (ref 39.0–52.0)
HCT: 25 % — ABNORMAL LOW (ref 39.0–52.0)
HCT: 20 % — ABNORMAL LOW (ref 39.0–52.0)
HCT: 24 % — ABNORMAL LOW (ref 39.0–52.0)
HCT: 31 % — ABNORMAL LOW (ref 39.0–52.0)
HCT: 32 % — ABNORMAL LOW (ref 39.0–52.0)
HCT: 30 % — ABNORMAL LOW (ref 39.0–52.0)
Hemoglobin: 12.2 g/dL — ABNORMAL LOW (ref 13.0–17.0)
Hemoglobin: 8.5 g/dL — ABNORMAL LOW (ref 13.0–17.0)
Hemoglobin: 6.8 g/dL — CL (ref 13.0–17.0)
Hemoglobin: 8.2 g/dL — ABNORMAL LOW (ref 13.0–17.0)
Hemoglobin: 10.5 g/dL — ABNORMAL LOW (ref 13.0–17.0)
Hemoglobin: 10.9 g/dL — ABNORMAL LOW (ref 13.0–17.0)
Hemoglobin: 10.2 g/dL — ABNORMAL LOW (ref 13.0–17.0)
O2 Saturation: 100 %
O2 Saturation: 100 %
O2 Saturation: 100 %
O2 Saturation: 100 %
O2 Saturation: 98 %
O2 Saturation: 99 %
O2 Saturation: 99 %
Patient temperature: 35.5
Patient temperature: 35.5
Patient temperature: 35.4
Potassium: 4 mmol/L (ref 3.5–5.1)
Potassium: 4 mmol/L (ref 3.5–5.1)
Potassium: 4.7 mmol/L (ref 3.5–5.1)
Potassium: 4.1 mmol/L (ref 3.5–5.1)
Potassium: 3.8 mmol/L (ref 3.5–5.1)
Potassium: 4.1 mmol/L (ref 3.5–5.1)
Potassium: 3.6 mmol/L (ref 3.5–5.1)
Sodium: 138 mmol/L (ref 135–145)
Sodium: 137 mmol/L (ref 135–145)
Sodium: 138 mmol/L (ref 135–145)
Sodium: 138 mmol/L (ref 135–145)
Sodium: 140 mmol/L (ref 135–145)
Sodium: 138 mmol/L (ref 135–145)
Sodium: 138 mmol/L (ref 135–145)
TCO2: 19 mmol/L — ABNORMAL LOW (ref 22–32)
TCO2: 23 mmol/L (ref 22–32)
TCO2: 25 mmol/L (ref 22–32)
TCO2: 25 mmol/L (ref 22–32)
TCO2: 23 mmol/L (ref 22–32)
TCO2: 24 mmol/L (ref 22–32)
TCO2: 22 mmol/L (ref 22–32)
pCO2 arterial: 30.1 mmHg — ABNORMAL LOW (ref 32–48)
pCO2 arterial: 43.1 mmHg (ref 32–48)
pCO2 arterial: 36.5 mmHg (ref 32–48)
pCO2 arterial: 39.1 mmHg (ref 32–48)
pCO2 arterial: 36 mmHg (ref 32–48)
pCO2 arterial: 35.6 mmHg (ref 32–48)
pCO2 arterial: 35.9 mmHg (ref 32–48)
pH, Arterial: 7.385 (ref 7.35–7.45)
pH, Arterial: 7.302 — ABNORMAL LOW (ref 7.35–7.45)
pH, Arterial: 7.427 (ref 7.35–7.45)
pH, Arterial: 7.399 (ref 7.35–7.45)
pH, Arterial: 7.39 (ref 7.35–7.45)
pH, Arterial: 7.399 (ref 7.35–7.45)
pH, Arterial: 7.354 (ref 7.35–7.45)
pO2, Arterial: 304 mmHg — ABNORMAL HIGH (ref 83–108)
pO2, Arterial: 401 mmHg — ABNORMAL HIGH (ref 83–108)
pO2, Arterial: 319 mmHg — ABNORMAL HIGH (ref 83–108)
pO2, Arterial: 374 mmHg — ABNORMAL HIGH (ref 83–108)
pO2, Arterial: 96 mmHg (ref 83–108)
pO2, Arterial: 108 mmHg (ref 83–108)
pO2, Arterial: 120 mmHg — ABNORMAL HIGH (ref 83–108)

## 2023-08-08 LAB — CBC
HCT: 31.8 % — ABNORMAL LOW (ref 39.0–52.0)
HCT: 34 % — ABNORMAL LOW (ref 39.0–52.0)
Hemoglobin: 10.9 g/dL — ABNORMAL LOW (ref 13.0–17.0)
Hemoglobin: 11.6 g/dL — ABNORMAL LOW (ref 13.0–17.0)
MCH: 31.4 pg (ref 26.0–34.0)
MCH: 31.1 pg (ref 26.0–34.0)
MCHC: 34.3 g/dL (ref 30.0–36.0)
MCHC: 34.1 g/dL (ref 30.0–36.0)
MCV: 91.6 fL (ref 80.0–100.0)
MCV: 91.2 fL (ref 80.0–100.0)
Platelets: 100 10*3/uL — ABNORMAL LOW (ref 150–400)
Platelets: 94 10*3/uL — ABNORMAL LOW (ref 150–400)
RBC: 3.47 MIL/uL — ABNORMAL LOW (ref 4.22–5.81)
RBC: 3.73 MIL/uL — ABNORMAL LOW (ref 4.22–5.81)
RDW: 13.8 % (ref 11.5–15.5)
RDW: 14 % (ref 11.5–15.5)
WBC: 6.9 10*3/uL (ref 4.0–10.5)
WBC: 8.7 10*3/uL (ref 4.0–10.5)
nRBC: 0 % (ref 0.0–0.2)
nRBC: 0 % (ref 0.0–0.2)

## 2023-08-08 LAB — POCT I-STAT, CHEM 8
BUN: 35 mg/dL — ABNORMAL HIGH (ref 8–23)
BUN: 34 mg/dL — ABNORMAL HIGH (ref 8–23)
BUN: 31 mg/dL — ABNORMAL HIGH (ref 8–23)
BUN: 31 mg/dL — ABNORMAL HIGH (ref 8–23)
Calcium, Ion: 1.23 mmol/L (ref 1.15–1.40)
Calcium, Ion: 1.29 mmol/L (ref 1.15–1.40)
Calcium, Ion: 0.99 mmol/L — ABNORMAL LOW (ref 1.15–1.40)
Calcium, Ion: 1.03 mmol/L — ABNORMAL LOW (ref 1.15–1.40)
Chloride: 107 mmol/L (ref 98–111)
Chloride: 104 mmol/L (ref 98–111)
Chloride: 100 mmol/L (ref 98–111)
Chloride: 101 mmol/L (ref 98–111)
Creatinine, Ser: 1.6 mg/dL — ABNORMAL HIGH (ref 0.61–1.24)
Creatinine, Ser: 1.5 mg/dL — ABNORMAL HIGH (ref 0.61–1.24)
Creatinine, Ser: 1.3 mg/dL — ABNORMAL HIGH (ref 0.61–1.24)
Creatinine, Ser: 1.4 mg/dL — ABNORMAL HIGH (ref 0.61–1.24)
Glucose, Bld: 169 mg/dL — ABNORMAL HIGH (ref 70–99)
Glucose, Bld: 182 mg/dL — ABNORMAL HIGH (ref 70–99)
Glucose, Bld: 125 mg/dL — ABNORMAL HIGH (ref 70–99)
Glucose, Bld: 108 mg/dL — ABNORMAL HIGH (ref 70–99)
HCT: 34 % — ABNORMAL LOW (ref 39.0–52.0)
HCT: 34 % — ABNORMAL LOW (ref 39.0–52.0)
HCT: 23 % — ABNORMAL LOW (ref 39.0–52.0)
HCT: 23 % — ABNORMAL LOW (ref 39.0–52.0)
Hemoglobin: 11.6 g/dL — ABNORMAL LOW (ref 13.0–17.0)
Hemoglobin: 11.6 g/dL — ABNORMAL LOW (ref 13.0–17.0)
Hemoglobin: 7.8 g/dL — ABNORMAL LOW (ref 13.0–17.0)
Hemoglobin: 7.8 g/dL — ABNORMAL LOW (ref 13.0–17.0)
Potassium: 4 mmol/L (ref 3.5–5.1)
Potassium: 4.1 mmol/L (ref 3.5–5.1)
Potassium: 4.2 mmol/L (ref 3.5–5.1)
Potassium: 4.1 mmol/L (ref 3.5–5.1)
Sodium: 137 mmol/L (ref 135–145)
Sodium: 137 mmol/L (ref 135–145)
Sodium: 138 mmol/L (ref 135–145)
Sodium: 137 mmol/L (ref 135–145)
TCO2: 21 mmol/L — ABNORMAL LOW (ref 22–32)
TCO2: 21 mmol/L — ABNORMAL LOW (ref 22–32)
TCO2: 23 mmol/L (ref 22–32)
TCO2: 25 mmol/L (ref 22–32)

## 2023-08-08 LAB — POCT I-STAT EG7
Acid-Base Excess: 10 mmol/L — ABNORMAL HIGH (ref 0.0–2.0)
Bicarbonate: 35.2 mmol/L — ABNORMAL HIGH (ref 20.0–28.0)
Calcium, Ion: 1.02 mmol/L — ABNORMAL LOW (ref 1.15–1.40)
HCT: 25 % — ABNORMAL LOW (ref 39.0–52.0)
Hemoglobin: 8.5 g/dL — ABNORMAL LOW (ref 13.0–17.0)
O2 Saturation: 86 %
Potassium: 4.6 mmol/L (ref 3.5–5.1)
Sodium: 147 mmol/L — ABNORMAL HIGH (ref 135–145)
TCO2: 37 mmol/L — ABNORMAL HIGH (ref 22–32)
pCO2, Ven: 53.7 mmHg (ref 44–60)
pH, Ven: 7.424 (ref 7.25–7.43)
pO2, Ven: 52 mmHg — ABNORMAL HIGH (ref 32–45)

## 2023-08-08 LAB — ECHO INTRAOPERATIVE TEE
Height: 70 in
Weight: 2720 [oz_av]

## 2023-08-08 LAB — BLOOD GAS, ARTERIAL
Acid-base deficit: 4.4 mmol/L — ABNORMAL HIGH (ref 0.0–2.0)
Bicarbonate: 17.8 mmol/L — ABNORMAL LOW (ref 20.0–28.0)
O2 Saturation: 99.9 %
Patient temperature: 37
pCO2 arterial: 25 mmHg — ABNORMAL LOW (ref 32–48)
pH, Arterial: 7.46 — ABNORMAL HIGH (ref 7.35–7.45)
pO2, Arterial: 122 mmHg — ABNORMAL HIGH (ref 83–108)

## 2023-08-08 LAB — BASIC METABOLIC PANEL WITH GFR
Anion gap: 13 (ref 5–15)
BUN: 27 mg/dL — ABNORMAL HIGH (ref 8–23)
CO2: 21 mmol/L — ABNORMAL LOW (ref 22–32)
Calcium: 8.2 mg/dL — ABNORMAL LOW (ref 8.9–10.3)
Chloride: 103 mmol/L (ref 98–111)
Creatinine, Ser: 1.41 mg/dL — ABNORMAL HIGH (ref 0.61–1.24)
GFR, Estimated: 49 mL/min — ABNORMAL LOW (ref >60)
Glucose, Bld: 158 mg/dL — ABNORMAL HIGH (ref 70–99)
Potassium: 4.1 mmol/L (ref 3.5–5.1)
Sodium: 137 mmol/L (ref 135–145)

## 2023-08-08 LAB — ABO/RH: ABO/RH(D): A POS

## 2023-08-08 LAB — PROTIME-INR
INR: 1.4 — ABNORMAL HIGH (ref 0.8–1.2)
Prothrombin Time: 17.7 s — ABNORMAL HIGH (ref 11.4–15.2)

## 2023-08-08 LAB — HEMOGLOBIN AND HEMATOCRIT, BLOOD
HCT: 25.5 % — ABNORMAL LOW (ref 39.0–52.0)
Hemoglobin: 8.3 g/dL — ABNORMAL LOW (ref 13.0–17.0)

## 2023-08-08 LAB — PREPARE RBC (CROSSMATCH)

## 2023-08-08 LAB — APTT: aPTT: 33 s (ref 24–36)

## 2023-08-08 LAB — PLATELET COUNT: Platelets: UNDETERMINED 10*3/uL (ref 150–400)

## 2023-08-08 LAB — MAGNESIUM: Magnesium: 2.9 mg/dL — ABNORMAL HIGH (ref 1.7–2.4)

## 2023-08-08 SURGERY — CORONARY ARTERY BYPASS GRAFTING (CABG)
Anesthesia: General | Site: Chest

## 2023-08-08 MED ORDER — FENTANYL CITRATE (PF) 250 MCG/5ML IJ SOLN
INTRAMUSCULAR | Status: AC
  Filled 2023-08-08: qty 5

## 2023-08-08 MED ORDER — NOREPINEPHRINE 4 MG/250ML-% IV SOLN
INTRAVENOUS | Status: DC | PRN
  Administered 2023-08-08: 4 ug/min via INTRAVENOUS

## 2023-08-08 MED ORDER — SODIUM CHLORIDE 0.9% IV SOLUTION: Freq: Once | INTRAVENOUS | Status: DC

## 2023-08-08 MED ORDER — ASPIRIN 325 MG PO TBEC: 325.0000 mg | DELAYED_RELEASE_TABLET | Freq: Every day | ORAL | Status: DC

## 2023-08-08 MED ORDER — INSULIN REGULAR(HUMAN) IN NACL 100-0.9 UT/100ML-% IV SOLN: INTRAVENOUS | Status: DC

## 2023-08-08 MED ORDER — HEPARIN SODIUM (PORCINE) 1000 UNIT/ML IJ SOLN
INTRAMUSCULAR | Status: AC
  Filled 2023-08-08: qty 1

## 2023-08-08 MED ORDER — DEXMEDETOMIDINE HCL IN NACL 400 MCG/100ML IV SOLN
0.0000 ug/kg/h | INTRAVENOUS | Status: DC
  Administered 2023-08-08: 0.5 ug/kg/h via INTRAVENOUS
  Filled 2023-08-08: qty 100

## 2023-08-08 MED ORDER — ACETAMINOPHEN 160 MG/5ML PO SOLN
650.0000 mg | Freq: Once | ORAL | Status: AC
  Administered 2023-08-08: 650 mg
  Filled 2023-08-08: qty 20.3

## 2023-08-08 MED ORDER — HEPARIN SODIUM (PORCINE) 1000 UNIT/ML IJ SOLN
INTRAMUSCULAR | Status: DC | PRN
  Administered 2023-08-08: 28000 [IU] via INTRAVENOUS

## 2023-08-08 MED ORDER — 0.9 % SODIUM CHLORIDE (POUR BTL) OPTIME
TOPICAL | Status: DC | PRN
  Administered 2023-08-08: 5000 mL

## 2023-08-08 MED ORDER — METOPROLOL TARTRATE 12.5 MG HALF TABLET: 12.5000 mg | ORAL_TABLET | Freq: Two times a day (BID) | ORAL | Status: DC

## 2023-08-08 MED ORDER — BISACODYL 10 MG RE SUPP: 10.0000 mg | Freq: Every day | RECTAL | Status: DC

## 2023-08-08 MED ORDER — POTASSIUM CHLORIDE 10 MEQ/50ML IV SOLN
10.0000 meq | INTRAVENOUS | Status: AC
  Administered 2023-08-08 (×3): 10 meq via INTRAVENOUS

## 2023-08-08 MED ORDER — DEXTROSE 50 % IV SOLN: 0.0000 mL | INTRAVENOUS | Status: DC | PRN

## 2023-08-08 MED ORDER — PROPOFOL 10 MG/ML IV BOLUS
INTRAVENOUS | Status: DC | PRN
  Administered 2023-08-08: 20 mg via INTRAVENOUS
  Administered 2023-08-08: 50 mg via INTRAVENOUS

## 2023-08-08 MED ORDER — ORAL CARE MOUTH RINSE: 15.0000 mL | OROMUCOSAL | Status: DC | PRN

## 2023-08-08 MED ORDER — LACTATED RINGERS IV SOLN: INTRAVENOUS | Status: AC

## 2023-08-08 MED ORDER — THROMBIN 5000 UNITS EX SOLR: OROMUCOSAL | Status: DC | PRN

## 2023-08-08 MED ORDER — ACETAMINOPHEN 160 MG/5ML PO SOLN
1000.0000 mg | Freq: Four times a day (QID) | ORAL | Status: DC
  Administered 2023-08-08 – 2023-08-12 (×9): 1000 mg
  Filled 2023-08-08 (×10): qty 40.6

## 2023-08-08 MED ORDER — PANTOPRAZOLE SODIUM 40 MG PO TBEC
40.0000 mg | DELAYED_RELEASE_TABLET | Freq: Every day | ORAL | Status: DC
  Administered 2023-08-10 – 2023-08-12 (×3): 40 mg via ORAL
  Filled 2023-08-08 (×3): qty 1

## 2023-08-08 MED ORDER — EPHEDRINE 5 MG/ML INJ
INTRAVENOUS | Status: AC
  Filled 2023-08-08: qty 5

## 2023-08-08 MED ORDER — SODIUM CHLORIDE 0.9% FLUSH: 3.0000 mL | INTRAVENOUS | Status: DC | PRN

## 2023-08-08 MED ORDER — PHENYLEPHRINE HCL-NACL 20-0.9 MG/250ML-% IV SOLN
0.0000 ug/min | INTRAVENOUS | Status: DC
Start: 2023-08-08 — End: 2023-08-09

## 2023-08-08 MED ORDER — PROTAMINE SULFATE 10 MG/ML IV SOLN
INTRAVENOUS | Status: AC
  Filled 2023-08-08: qty 25

## 2023-08-08 MED ORDER — SODIUM CHLORIDE 0.45 % IV SOLN: INTRAVENOUS | Status: AC | PRN

## 2023-08-08 MED ORDER — VASOPRESSIN 20 UNIT/ML IV SOLN
INTRAVENOUS | Status: AC
  Filled 2023-08-08: qty 1

## 2023-08-08 MED ORDER — PLASMA-LYTE A IV SOLN: INTRAVENOUS | Status: DC | PRN

## 2023-08-08 MED ORDER — PROTAMINE SULFATE 10 MG/ML IV SOLN
INTRAVENOUS | Status: DC | PRN
  Administered 2023-08-08: 225 mg via INTRAVENOUS
  Administered 2023-08-08: 25 mg via INTRAVENOUS

## 2023-08-08 MED ORDER — MIDAZOLAM HCL (PF) 10 MG/2ML IJ SOLN
INTRAMUSCULAR | Status: AC
  Filled 2023-08-08: qty 2

## 2023-08-08 MED ORDER — SODIUM CHLORIDE 0.9% FLUSH
3.0000 mL | Freq: Two times a day (BID) | INTRAVENOUS | Status: DC
  Administered 2023-08-08 (×2): 10 mL via INTRAVENOUS
  Administered 2023-08-09: 3 mL via INTRAVENOUS

## 2023-08-08 MED ORDER — ACETAMINOPHEN 10 MG/ML IV SOLN
INTRAVENOUS | Status: AC
  Filled 2023-08-08: qty 100

## 2023-08-08 MED ORDER — CHLORHEXIDINE GLUCONATE 4 % EX SOLN: 30.0000 mL | CUTANEOUS | Status: DC

## 2023-08-08 MED ORDER — SODIUM CHLORIDE 0.9 % IV SOLN: 250.0000 mL | INTRAVENOUS | Status: DC

## 2023-08-08 MED ORDER — ACETAMINOPHEN 10 MG/ML IV SOLN
INTRAVENOUS | Status: DC | PRN
  Administered 2023-08-08: 1000 mg via INTRAVENOUS

## 2023-08-08 MED ORDER — METOPROLOL TARTRATE 12.5 MG HALF TABLET: 12.5000 mg | ORAL_TABLET | Freq: Once | ORAL | Status: DC

## 2023-08-08 MED ORDER — ~~LOC~~ CARDIAC SURGERY, PATIENT & FAMILY EDUCATION
Freq: Once | Status: DC
  Filled 2023-08-08: qty 1

## 2023-08-08 MED ORDER — MAGNESIUM SULFATE 4 GM/100ML IV SOLN
4.0000 g | Freq: Once | INTRAVENOUS | Status: AC
  Administered 2023-08-08: 4 g via INTRAVENOUS
  Filled 2023-08-08: qty 100

## 2023-08-08 MED ORDER — ACETAMINOPHEN 500 MG PO TABS
1000.0000 mg | ORAL_TABLET | Freq: Once | ORAL | Status: DC
  Filled 2023-08-08: qty 2

## 2023-08-08 MED ORDER — ONDANSETRON HCL 4 MG/2ML IJ SOLN: 4.0000 mg | Freq: Four times a day (QID) | INTRAMUSCULAR | Status: DC | PRN

## 2023-08-08 MED ORDER — ACETAMINOPHEN 500 MG PO TABS
1000.0000 mg | ORAL_TABLET | Freq: Four times a day (QID) | ORAL | Status: DC
Start: 2023-08-09 — End: 2023-08-13
  Filled 2023-08-08: qty 2

## 2023-08-08 MED ORDER — NOREPINEPHRINE 4 MG/250ML-% IV SOLN
0.0000 ug/min | INTRAVENOUS | Status: DC
  Administered 2023-08-09: 1 ug/min via INTRAVENOUS
  Filled 2023-08-08: qty 250

## 2023-08-08 MED ORDER — ASPIRIN 81 MG PO CHEW
324.0000 mg | CHEWABLE_TABLET | Freq: Every day | ORAL | Status: DC
  Filled 2023-08-08: qty 4

## 2023-08-08 MED ORDER — DOCUSATE SODIUM 100 MG PO CAPS
200.0000 mg | ORAL_CAPSULE | Freq: Every day | ORAL | Status: DC
  Administered 2023-08-09: 200 mg via ORAL

## 2023-08-08 MED ORDER — ALBUMIN HUMAN 5 % IV SOLN: INTRAVENOUS | Status: DC | PRN

## 2023-08-08 MED ORDER — METOPROLOL TARTRATE 25 MG/10 ML ORAL SUSPENSION
12.5000 mg | Freq: Two times a day (BID) | ORAL | Status: DC
Start: 2023-08-08 — End: 2023-08-09
  Filled 2023-08-08: qty 10

## 2023-08-08 MED ORDER — VANCOMYCIN HCL 1000 MG IV SOLR
INTRAVENOUS | Status: DC | PRN
  Administered 2023-08-08: 1250 mg via INTRAVENOUS

## 2023-08-08 MED ORDER — LACTATED RINGERS IV SOLN: INTRAVENOUS | Status: DC | PRN

## 2023-08-08 MED ORDER — ROCURONIUM BROMIDE 10 MG/ML (PF) SYRINGE
PREFILLED_SYRINGE | INTRAVENOUS | Status: DC | PRN
  Administered 2023-08-08: 100 mg via INTRAVENOUS
  Administered 2023-08-08: 50 mg via INTRAVENOUS

## 2023-08-08 MED ORDER — PHENYLEPHRINE 80 MCG/ML (10ML) SYRINGE FOR IV PUSH (FOR BLOOD PRESSURE SUPPORT)
PREFILLED_SYRINGE | INTRAVENOUS | Status: AC
  Filled 2023-08-08: qty 10

## 2023-08-08 MED ORDER — OXYCODONE HCL 5 MG PO TABS: 5.0000 mg | ORAL_TABLET | ORAL | Status: DC | PRN

## 2023-08-08 MED ORDER — TRAMADOL HCL 50 MG PO TABS: 50.0000 mg | ORAL_TABLET | ORAL | Status: DC | PRN

## 2023-08-08 MED ORDER — METOPROLOL TARTRATE 5 MG/5ML IV SOLN: 2.5000 mg | INTRAVENOUS | Status: DC | PRN

## 2023-08-08 MED ORDER — NITROGLYCERIN IN D5W 200-5 MCG/ML-% IV SOLN: 0.0000 ug/min | INTRAVENOUS | Status: DC

## 2023-08-08 MED ORDER — CHLORHEXIDINE GLUCONATE 0.12 % MT SOLN
15.0000 mL | OROMUCOSAL | Status: AC
  Administered 2023-08-08: 15 mL via OROMUCOSAL
  Filled 2023-08-08: qty 15

## 2023-08-08 MED ORDER — MIDAZOLAM HCL 2 MG/2ML IJ SOLN
2.0000 mg | INTRAMUSCULAR | Status: DC | PRN
  Administered 2023-08-08: 2 mg via INTRAVENOUS
  Filled 2023-08-08: qty 2

## 2023-08-08 MED ORDER — SODIUM CHLORIDE 0.9 % IV SOLN: INTRAVENOUS | Status: DC | PRN

## 2023-08-08 MED ORDER — THROMBIN 20000 UNITS EX SOLR
CUTANEOUS | Status: DC | PRN
  Administered 2023-08-08: 20000 [IU] via TOPICAL

## 2023-08-08 MED ORDER — SODIUM CHLORIDE 0.9% FLUSH
3.0000 mL | Freq: Two times a day (BID) | INTRAVENOUS | Status: DC
  Administered 2023-08-09 – 2023-08-10 (×2): 3 mL via INTRAVENOUS

## 2023-08-08 MED ORDER — CEFAZOLIN SODIUM-DEXTROSE 2-4 GM/100ML-% IV SOLN
2.0000 g | Freq: Three times a day (TID) | INTRAVENOUS | Status: AC
  Administered 2023-08-08 – 2023-08-10 (×6): 2 g via INTRAVENOUS
  Filled 2023-08-08 (×6): qty 100

## 2023-08-08 MED ORDER — MORPHINE SULFATE (PF) 2 MG/ML IV SOLN
1.0000 mg | INTRAVENOUS | Status: DC | PRN
  Administered 2023-08-08: 1 mg via INTRAVENOUS
  Administered 2023-08-09: 2 mg via INTRAVENOUS
  Filled 2023-08-08 (×2): qty 1

## 2023-08-08 MED ORDER — PHENYLEPHRINE 80 MCG/ML (10ML) SYRINGE FOR IV PUSH (FOR BLOOD PRESSURE SUPPORT)
PREFILLED_SYRINGE | INTRAVENOUS | Status: DC | PRN
Start: 2023-08-08 — End: 2023-08-08
  Administered 2023-08-08: 160 ug via INTRAVENOUS
  Administered 2023-08-08: 40 ug via INTRAVENOUS
  Administered 2023-08-08: 80 ug via INTRAVENOUS
  Administered 2023-08-08: 160 ug via INTRAVENOUS

## 2023-08-08 MED ORDER — CHLORHEXIDINE GLUCONATE 0.12 % MT SOLN
15.0000 mL | Freq: Once | OROMUCOSAL | Status: DC
Start: 2023-08-09 — End: 2023-08-08
  Filled 2023-08-08: qty 15

## 2023-08-08 MED ORDER — ATORVASTATIN CALCIUM 40 MG PO TABS
40.0000 mg | ORAL_TABLET | Freq: Every day | ORAL | Status: DC
  Administered 2023-08-09 – 2023-08-12 (×4): 40 mg via ORAL
  Filled 2023-08-08 (×4): qty 1

## 2023-08-08 MED ORDER — VANCOMYCIN HCL IN DEXTROSE 1-5 GM/200ML-% IV SOLN
1000.0000 mg | Freq: Once | INTRAVENOUS | Status: AC
  Administered 2023-08-08: 1000 mg via INTRAVENOUS
  Filled 2023-08-08: qty 200

## 2023-08-08 MED ORDER — ASPIRIN 81 MG PO CHEW
324.0000 mg | CHEWABLE_TABLET | Freq: Once | ORAL | Status: AC
  Administered 2023-08-08: 324 mg via ORAL
  Filled 2023-08-08: qty 4

## 2023-08-08 MED ORDER — ALBUMIN HUMAN 5 % IV SOLN
250.0000 mL | INTRAVENOUS | Status: DC | PRN
  Administered 2023-08-08 (×4): 12.5 g via INTRAVENOUS
  Filled 2023-08-08 (×2): qty 250

## 2023-08-08 MED ORDER — SODIUM BICARBONATE 8.4 % IV SOLN
50.0000 meq | Freq: Once | INTRAVENOUS | Status: AC
  Administered 2023-08-08: 50 meq via INTRAVENOUS

## 2023-08-08 MED ORDER — FENTANYL CITRATE (PF) 250 MCG/5ML IJ SOLN
INTRAMUSCULAR | Status: DC | PRN
  Administered 2023-08-08 (×2): 200 ug via INTRAVENOUS
  Administered 2023-08-08: 250 ug via INTRAVENOUS
  Administered 2023-08-08: 50 ug via INTRAVENOUS
  Administered 2023-08-08 (×2): 100 ug via INTRAVENOUS

## 2023-08-08 MED ORDER — PANTOPRAZOLE SODIUM 40 MG IV SOLR
40.0000 mg | Freq: Every day | INTRAVENOUS | Status: AC
  Administered 2023-08-08 – 2023-08-09 (×2): 40 mg via INTRAVENOUS
  Filled 2023-08-08 (×2): qty 10

## 2023-08-08 MED ORDER — PROTAMINE SULFATE 10 MG/ML IV SOLN
INTRAVENOUS | Status: AC
  Filled 2023-08-08: qty 5

## 2023-08-08 MED ORDER — METOCLOPRAMIDE HCL 5 MG/ML IJ SOLN
10.0000 mg | Freq: Four times a day (QID) | INTRAMUSCULAR | Status: AC
  Administered 2023-08-08 – 2023-08-09 (×6): 10 mg via INTRAVENOUS
  Filled 2023-08-08 (×6): qty 2

## 2023-08-08 MED ORDER — BISACODYL 5 MG PO TBEC: 10.0000 mg | DELAYED_RELEASE_TABLET | Freq: Every day | ORAL | Status: DC

## 2023-08-08 MED ORDER — THROMBIN (RECOMBINANT) 20000 UNITS EX SOLR
CUTANEOUS | Status: AC
  Filled 2023-08-08: qty 20000

## 2023-08-08 MED ORDER — PROPOFOL 10 MG/ML IV BOLUS
INTRAVENOUS | Status: AC
  Filled 2023-08-08: qty 20

## 2023-08-08 MED ORDER — NOREPINEPHRINE BITARTRATE 1 MG/ML IV SOLN
INTRAVENOUS | Status: DC | PRN
  Administered 2023-08-08 (×2): 1 mL via INTRAVENOUS
  Administered 2023-08-08: 2 mL via INTRAVENOUS
  Administered 2023-08-08 (×5): 1 mL via INTRAVENOUS

## 2023-08-08 MED ORDER — MIDAZOLAM HCL (PF) 5 MG/ML IJ SOLN
INTRAMUSCULAR | Status: DC | PRN
  Administered 2023-08-08: 1 mg via INTRAVENOUS
  Administered 2023-08-08: 4 mg via INTRAVENOUS
  Administered 2023-08-08: 2 mg via INTRAVENOUS

## 2023-08-08 MED ORDER — ROCURONIUM BROMIDE 10 MG/ML (PF) SYRINGE
PREFILLED_SYRINGE | INTRAVENOUS | Status: AC
  Filled 2023-08-08: qty 10

## 2023-08-08 SURGICAL SUPPLY — 79 items
BAG DECANTER FOR FLEXI CONT (MISCELLANEOUS) ×2 IMPLANT
BLADE CLIPPER SURG (BLADE) ×2 IMPLANT
BLADE STERNUM SYSTEM 6 (BLADE) ×2 IMPLANT
BNDG ELASTIC 4INX 5YD STR LF (GAUZE/BANDAGES/DRESSINGS) IMPLANT
BNDG ELASTIC 6INX 5YD STR LF (GAUZE/BANDAGES/DRESSINGS) ×2 IMPLANT
BNDG GAUZE DERMACEA FLUFF 4 (GAUZE/BANDAGES/DRESSINGS) ×2 IMPLANT
CANNULA ARTERIAL VENT 3/8 20FR (CANNULA) IMPLANT
CANNULA MC2 2 STG 36/46 CONN (CANNULA) IMPLANT
CATH ROBINSON RED A/P 18FR (CATHETERS) ×4 IMPLANT
CATH THORACIC 28FR (CATHETERS) ×2 IMPLANT
CATH THORACIC 36FR (CATHETERS) ×2 IMPLANT
CATH THORACIC 36FR RT ANG (CATHETERS) ×2 IMPLANT
CLIP TI WIDE RED SMALL 24 (CLIP) IMPLANT
CONTAINER PROTECT SURGISLUSH (MISCELLANEOUS) ×4 IMPLANT
DERMABOND ADVANCED .7 DNX12 (GAUZE/BANDAGES/DRESSINGS) IMPLANT
DRAPE SRG 135X102X78XABS (DRAPES) ×2 IMPLANT
DRAPE WARM FLUID 44X44 (DRAPES) ×2 IMPLANT
DRSG COVADERM 4X14 (GAUZE/BANDAGES/DRESSINGS) ×2 IMPLANT
ELECT CAUTERY BLADE 6.4 (BLADE) ×2 IMPLANT
ELECT REM PT RETURN 9FT ADLT (ELECTROSURGICAL) ×4 IMPLANT
ELECTRODE REM PT RTRN 9FT ADLT (ELECTROSURGICAL) ×4 IMPLANT
FELT TEFLON 1X6 (MISCELLANEOUS) ×2 IMPLANT
GAUZE SPONGE 4X4 12PLY STRL (GAUZE/BANDAGES/DRESSINGS) ×4 IMPLANT
GLOVE BIO SURGEON STRL SZ 6 (GLOVE) IMPLANT
GLOVE BIO SURGEON STRL SZ 6.5 (GLOVE) IMPLANT
GLOVE BIOGEL PI IND STRL 6.5 (GLOVE) IMPLANT
GLOVE BIOGEL PI IND STRL 7.0 (GLOVE) IMPLANT
GLOVE ECLIPSE 7.0 STRL STRAW (GLOVE) ×4 IMPLANT
GLOVE ORTHO TXT STRL SZ7.5 (GLOVE) IMPLANT
GLOVE SURG SS PI 6.5 STRL IVOR (GLOVE) IMPLANT
GOWN STRL REUS W/ TWL LRG LVL3 (GOWN DISPOSABLE) ×8 IMPLANT
GOWN STRL REUS W/ TWL XL LVL3 (GOWN DISPOSABLE) ×2 IMPLANT
HEMOSTAT POWDER SURGIFOAM 1G (HEMOSTASIS) ×6 IMPLANT
HEMOSTAT SURGICEL 2X14 (HEMOSTASIS) ×2 IMPLANT
INSERT FOGARTY XLG (MISCELLANEOUS) IMPLANT
KIT BASIN OR (CUSTOM PROCEDURE TRAY) ×2 IMPLANT
KIT SUCTION CATH 14FR (SUCTIONS) ×2 IMPLANT
KIT TURNOVER KIT B (KITS) ×2 IMPLANT
KIT VASOVIEW HEMOPRO 2 VH 4000 (KITS) ×2 IMPLANT
KNIFE MICRO-UNI 3.5 30 DEG (BLADE) ×2 IMPLANT
NS IRRIG 1000ML POUR BTL (IV SOLUTION) ×10 IMPLANT
PACK E OPEN HEART (SUTURE) ×2 IMPLANT
PACK OPEN HEART (CUSTOM PROCEDURE TRAY) ×2 IMPLANT
PAD ARMBOARD POSITIONER FOAM (MISCELLANEOUS) ×4 IMPLANT
PAD ELECT DEFIB RADIOL ZOLL (MISCELLANEOUS) ×2 IMPLANT
PENCIL BUTTON HOLSTER BLD 10FT (ELECTRODE) ×2 IMPLANT
POSITIONER HEAD DONUT 9IN (MISCELLANEOUS) ×2 IMPLANT
PUNCH AORTIC ROTATE 4.5MM 8IN (MISCELLANEOUS) ×2 IMPLANT
SENSOR MYOCARDIAL TEMP (MISCELLANEOUS) IMPLANT
SET MPS 3-ND DEL (MISCELLANEOUS) IMPLANT
SPONGE T-LAP 18X18 ~~LOC~~+RFID (SPONGE) IMPLANT
SUPPORT HEART JANKE-BARRON (MISCELLANEOUS) ×2 IMPLANT
SUT BONE WAX W31G (SUTURE) ×2 IMPLANT
SUT MNCRL AB 4-0 PS2 18 (SUTURE) IMPLANT
SUT PROLENE 3 0 SH1 36 (SUTURE) ×2 IMPLANT
SUT PROLENE 4 0 SH DA (SUTURE) IMPLANT
SUT PROLENE 4-0 RB1 .5 CRCL 36 (SUTURE) IMPLANT
SUT PROLENE 5 0 C 1 36 (SUTURE) IMPLANT
SUT PROLENE 6 0 C 1 30 (SUTURE) IMPLANT
SUT PROLENE 7 0 BV 1 (SUTURE) IMPLANT
SUT PROLENE 7 0 BV1 MDA (SUTURE) ×2 IMPLANT
SUT PROLENE 8 0 BV175 6 (SUTURE) IMPLANT
SUT SILK 1 MH (SUTURE) IMPLANT
SUT SILK 2 0 SH CR/8 (SUTURE) IMPLANT
SUT STEEL 6MS V (SUTURE) IMPLANT
SUT STEEL STERNAL CCS#1 18IN (SUTURE) IMPLANT
SUT STEEL SZ 6 DBL 3X14 BALL (SUTURE) IMPLANT
SUT VIC AB 1 CTX36XBRD ANBCTR (SUTURE) ×4 IMPLANT
SUT VIC AB 2-0 CT1 TAPERPNT 27 (SUTURE) IMPLANT
SUT VIC AB 3-0 X1 27 (SUTURE) IMPLANT
SYSTEM SAHARA CHEST DRAIN ATS (WOUND CARE) ×2 IMPLANT
TAPE CLOTH SURG 4X10 WHT LF (GAUZE/BANDAGES/DRESSINGS) IMPLANT
TAPE PAPER 2X10 WHT MICROPORE (GAUZE/BANDAGES/DRESSINGS) IMPLANT
TOWEL GREEN STERILE (TOWEL DISPOSABLE) ×2 IMPLANT
TOWEL GREEN STERILE FF (TOWEL DISPOSABLE) ×2 IMPLANT
TRAY FOLEY SLVR 16FR TEMP STAT (SET/KITS/TRAYS/PACK) ×2 IMPLANT
TUBING LAP HI FLOW INSUFFLATIO (TUBING) ×2 IMPLANT
UNDERPAD 30X36 HEAVY ABSORB (UNDERPADS AND DIAPERS) ×2 IMPLANT
WATER STERILE IRR 1000ML POUR (IV SOLUTION) ×4 IMPLANT

## 2023-08-08 NOTE — Transfer of Care (Signed)
 Immediate Anesthesia Transfer of Care Note  Patient: Adam Frost  Procedure(s) Performed: CORONARY ARTERY BYPASS GRAFTING (CABG) TIMES THREE UTILIZING THE LEFT INTERNAL MAMMARY ARTERY AND ENDOSCOPIC VEIN HARVEST LEFT GREATER SAPHENOUS VEIN (Chest) ECHOCARDIOGRAM, TRANSESOPHAGEAL  Patient Location: SICU  Anesthesia Type:General  Level of Consciousness: Patient remains intubated per anesthesia plan  Airway & Oxygen Therapy: Patient remains intubated per anesthesia plan  Post-op Assessment: Report given to RN and Post -op Vital signs reviewed and stable  Post vital signs: Reviewed and stable  Last Vitals:  Vitals Value Taken Time  BP 91/44 08/08/23 1315  Temp 98.0   Pulse 80 08/08/23 1315  Resp 16 08/08/23 1315  SpO2 97 % 08/08/23 1315    Last Pain:  Vitals:   08/08/23 0625  TempSrc:   PainSc: 0-No pain      Patients Stated Pain Goal: 0 (08/08/23 0625)  Complications: No notable events documented. Norepi restarted at 9mcg/min.

## 2023-08-08 NOTE — Anesthesia Procedure Notes (Signed)
 Central Venous Catheter Insertion Performed by: Gaynelle Adu, MD, anesthesiologist Start/End4/11/2023 6:35 AM, 08/08/2023 6:50 AM Patient location: Pre-op. Preanesthetic checklist: patient identified, IV checked, site marked, risks and benefits discussed, surgical consent, monitors and equipment checked, pre-op evaluation, timeout performed and anesthesia consent Position: Trendelenburg Lidocaine 1% used for infiltration and patient sedated Hand hygiene performed , maximum sterile barriers used  and Seldinger technique used Catheter size: 9 Fr Total catheter length 10. Central line was placed.MAC introducer Procedure performed using ultrasound guided technique. Ultrasound Notes:anatomy identified, needle tip was noted to be adjacent to the nerve/plexus identified, no ultrasound evidence of intravascular and/or intraneural injection and image(s) printed for medical record Attempts: 1 Following insertion, line sutured, dressing applied and Biopatch. Post procedure assessment: blood return through all ports, free fluid flow and no air  Patient tolerated the procedure well with no immediate complications.

## 2023-08-08 NOTE — Progress Notes (Signed)
 Anesthesia made aware of CBG 188.

## 2023-08-08 NOTE — Procedures (Signed)
 Extubation Procedure Note  Patient Details:   Name: Adam Frost DOB: 1941-01-07 MRN: [id number]   Airway Documentation:    Vent end date: 08/08/23 Vent end time: 1850   Evaluation  O2 sats: stable throughout Complications: No apparent complications Patient did tolerate procedure well. Bilateral Breath Sounds: Clear, Diminished   Yes  Patient was extubated to a 3L [place] without any complications, dyspnea or stridor noted. NIF: -26, VC: 2.3L, positive cuff leak prior to extubation.   Nikyla Navedo, Margaretmary Dys 08/08/2023, 6:50 PM

## 2023-08-08 NOTE — Interval H&P Note (Signed)
 History and Physical Interval Note:  08/08/2023 6:34 AM  Adam Frost  has presented today for surgery, with the diagnosis of CAD.  The various methods of treatment have been discussed with the patient and family. After consideration of risks, benefits and other options for treatment, the patient has consented to  Procedure(s): CORONARY ARTERY BYPASS GRAFTING (CABG) (N/A) ECHOCARDIOGRAM, TRANSESOPHAGEAL (N/A) as a surgical intervention.  The patient's history has been reviewed, patient examined, no change in status, stable for surgery.  I have reviewed the patient's chart and labs.  Questions were answered to the patient's satisfaction.     Alleen Borne

## 2023-08-08 NOTE — Anesthesia Postprocedure Evaluation (Signed)
 Anesthesia Post Note  Patient: Adam Frost  Procedure(s) Performed: CORONARY ARTERY BYPASS GRAFTING (CABG) TIMES THREE UTILIZING THE LEFT INTERNAL MAMMARY ARTERY AND ENDOSCOPIC VEIN HARVEST LEFT GREATER SAPHENOUS VEIN (Chest) ECHOCARDIOGRAM, TRANSESOPHAGEAL     Patient location during evaluation: SICU Anesthesia Type: General Level of consciousness: sedated Pain management: pain level controlled Vital Signs Assessment: post-procedure vital signs reviewed and stable Respiratory status: patient remains intubated per anesthesia plan Cardiovascular status: stable Postop Assessment: no apparent nausea or vomiting Anesthetic complications: no  No notable events documented.  Last Vitals:  Vitals:   08/08/23 1500 08/08/23 1515  BP:    Pulse: 80 80  Resp: 16 16  Temp: (!) 34.9 C (!) 34.8 C  SpO2: 96% 97%    Last Pain:  Vitals:   08/08/23 0625  TempSrc:   PainSc: 0-No pain                 Mirko Tailor,W. EDMOND

## 2023-08-08 NOTE — Op Note (Signed)
 CARDIOVASCULAR SURGERY OPERATIVE NOTE  08/08/2023  Surgeon:  Alleen Borne, MD  First Assistant: Gershon Crane,  PA-C:   An experienced assistant was required given the complexity of this surgery and the standard of surgical care. The assistant was needed for endoscopic vein harvest, exposure, dissection, suctioning, retraction of delicate tissues and sutures, instrument exchange and for overall help during this procedure.   Preoperative Diagnosis:  Severe multi-vessel coronary artery disease   Postoperative Diagnosis:  Same   Procedure:  Median Sternotomy Extracorporeal circulation 3.   Coronary artery bypass grafting x 3  Left internal mammary artery graft to the LAD SVG to OM SVG to PL (RCA)  4.   Endoscopic vein harvest from the left leg   Anesthesia:  General Endotracheal   Clinical History/Surgical Indication:  This 83 year old gentleman has high-grade left main and severe three-vessel coronary disease with mildly decreased left ventricular systolic function with inferior hypokinesis and significant symptoms of exertional fatigue, shortness of breath, and chest pressure. I agree that the best treatment is coronary artery bypass graft surgery. I reviewed the catheterization images with the patient and his family and answered their questions. I discussed the operative procedure with the patient and family including alternatives, benefits and risks; including but not limited to bleeding, blood transfusion, infection, stroke, myocardial infarction, graft failure, heart block requiring a permanent pacemaker, organ dysfunction, and death. Adam Frost understands and agrees to proceed.   Preparation:  The patient was seen in the preoperative holding area and the correct patient, correct operation were confirmed with the patient after reviewing the medical record and catheterization. The  consent was signed by me. Preoperative antibiotics were given. A pulmonary arterial line and radial arterial line were placed by the anesthesia team. The patient was taken back to the operating room and positioned supine on the operating room table. After being placed under general endotracheal anesthesia by the anesthesia team a foley catheter was placed. The neck, chest, abdomen, and both legs were prepped with betadine soap and solution and draped in the usual sterile manner. A surgical time-out was taken and the correct patient and operative procedure were confirmed with the nursing and anesthesia staff.   Cardiopulmonary Bypass:  A median sternotomy was performed. The pericardium was opened in the midline. Right ventricular function appeared normal. The ascending aorta was of normal size and had no palpable plaque. There were no contraindications to aortic cannulation or cross-clamping. The patient was fully systemically heparinized and the ACT was maintained > 400 sec. The proximal aortic arch was cannulated with a 20 F aortic cannula for arterial inflow. Venous cannulation was performed via the right atrial appendage using a two-staged venous cannula. An antegrade cardioplegia/vent cannula was inserted into the mid-ascending aorta. Aortic occlusion was performed with a single cross-clamp. Systemic cooling to 32 degrees Centigrade and topical cooling of the heart with iced saline were used. Hyperkalemic antegrade cold blood cardioplegia was used to induce diastolic arrest and was then given at about 20 minute intervals throughout the period of arrest to maintain myocardial temperature at or below 10 degrees centigrade. A temperature probe was inserted into the interventricular septum and an insulating pad was placed in the pericardium.   Left internal mammary artery harvest:  The left side of the sternum was retracted using the Rultract retractor. The left internal mammary artery was harvested as a  pedicle graft. All side branches were clipped. It was a medium-sized vessel of good quality with excellent blood flow. It was  ligated distally and divided. It was sprayed with topical papaverine solution to prevent vasospasm.   Endoscopic vein harvest:  We initially looked at the right greater saphenous vein medial to the right knee but it was small and very superficial and not felt to be ideal. Therefore we decided to examine the left leg vein.   The left greater saphenous vein was harvested endoscopically through a 2 cm incision medial to the left knee. It was harvested from the upper thigh to below the knee. It was a medium-sized vein of good quality. The side branches were all ligated with 4-0 silk ties.    Coronary arteries:  The coronary arteries were examined.  LAD:  large vessel with segmental distal disease. LCX:  OM1 was a medium caliber vessel with segmental distal disease RCA:  The main vessel was diffusely diseased with calcific plaque and this extended throughout the PDA branch which was occluded and not graftable. The PL was a large vessel with no distal disease.   Grafts:  LIMA to the LAD: 2.0 mm. It was sewn end to side using 8-0 prolene continuous suture. SVG to OM1:  1.6 mm. It was sewn end to side using 7-0 prolene continuous suture. SVG to PL (RCA):  1.75 mm. It was sewn end to side using 7-0 prolene continuous suture.   The proximal vein graft anastomoses were performed to the mid-ascending aorta using continuous 6-0 prolene suture. Graft markers were placed around the proximal anastomoses.   Completion:  The patient was rewarmed to 37 degrees Centigrade. The clamp was removed from the LIMA pedicle and there was rapid warming of the septum and return of ventricular fibrillation. The crossclamp was removed with a time of 79 minutes. There was spontaneous return of sinus rhythm. The distal and proximal anastomoses were checked for hemostasis. The position of the  grafts was satisfactory. Two temporary epicardial pacing wires were placed on the right atrium and two on the right ventricle. The patient was weaned from CPB without difficulty on no inotropes. CPB time was 97 minutes. Cardiac output was 5 LPM. TEE showed normal LV systolic function. Heparin was fully reversed with protamine and the aortic and venous cannulas removed. Hemostasis was achieved. Mediastinal and left pleural drainage tubes were placed. The sternum was closed with  #6 stainless steel wires. The fascia was closed with continuous # 1 vicryl suture. The subcutaneous tissue was closed with 2-0 vicryl continuous suture. The skin was closed with 3-0 vicryl subcuticular suture. All sponge, needle, and instrument counts were reported correct at the end of the case. Dry sterile dressings were placed over the incisions and around the chest tubes which were connected to pleurevac suction. The patient was then transported to the surgical intensive care unit in stable condition.

## 2023-08-08 NOTE — Anesthesia Procedure Notes (Signed)
 Central Venous Catheter Insertion Performed by: Gaynelle Adu, MD, anesthesiologist Start/End4/11/2023 6:35 AM, 08/08/2023 6:50 AM Patient location: Pre-op. Preanesthetic checklist: patient identified, IV checked, site marked, risks and benefits discussed, surgical consent, monitors and equipment checked, pre-op evaluation, timeout performed and anesthesia consent Hand hygiene performed  and maximum sterile barriers used  PA cath was placed.Swan type:thermodilution PA Cath depth:50 Procedure performed without using ultrasound guided technique. Attempts: 1 Patient tolerated the procedure well with no immediate complications.

## 2023-08-08 NOTE — Brief Op Note (Signed)
 08/08/2023  9:58 AM  PATIENT:  Adam Frost  83 y.o. male  PRE-OPERATIVE DIAGNOSIS:  CORONARY ARTERY DISEASE  POST-OPERATIVE DIAGNOSIS:  CORONARY ARTERY DISEASE  PROCEDURE:  Procedure(s): CORONARY ARTERY BYPASS GRAFTING (CABG) TIMES THREE UTILIZING THE LEFT INTERNAL MAMMARY ARTERY AND ENDOSCOPIC VEIN HARVEST LEFT GREATER SAPHENOUS VEIN (N/A) ECHOCARDIOGRAM, TRANSESOPHAGEAL (N/A) Vein harvest time: Vein prep time: LIMA-LAD SVG-PL SVG-OM  SURGEON:  Surgeons and Role:    * Bartle, Payton Doughty, MD - Primary  PHYSICIAN ASSISTANT: Somya Jauregui PA-C  ASSISTANTS: Alyce Pagan RN   ANESTHESIA:   general  EBL:  850 ml  BLOOD ADMINISTERED:none  DRAINS:  LEFT PLLEURAL AND MEDIASTINAL CHEST TUBES    LOCAL MEDICATIONS USED:  NONE  SPECIMEN:  No Specimen  DISPOSITION OF SPECIMEN:  N/A  COUNTS:  YES  TOURNIQUET:  * No tourniquets in log *  DICTATION: .Dragon Dictation  PLAN OF CARE: Admit to inpatient   PATIENT DISPOSITION:  ICU - intubated and hemodynamically stable.   Delay start of Pharmacological VTE agent (>24hrs) due to surgical blood loss or risk of bleeding: yes  COMPLICATIONS: NO KNOWN

## 2023-08-08 NOTE — Hospital Course (Addendum)
 HPI: Mr. Oestreicher is an 83 year old active gentleman with a history of type 2 diabetes, hypertension, and hyperlipidemia who presented with a 42-month history of progressive exertional shortness of breath and fatigue as well as occasional episodes of substernal chest tightness.  He underwent a stress test showing inferior ST changes without any symptoms.  A cardiac CT showed a coronary calcium score of 1681 which was 75th percentile.  It suggested a high-grade stenosis in the left main coronary artery.  CT FFR across the left main was 0.74.  The distal RCA was 0.73.  A 2D echocardiogram showed an ejection fraction of 50% with inferior hypokinesis and no significant valvular abnormality.  Cardiac catheterization yesterday by Dr. Darrold Junker showed a diffusely diseased left main with 70% stenosis.  The proximal LAD had a long area of 85% stenosis.  There is about 50% mid LAD stenosis.  There was a small first diagonal branch with 75% stenosis.  The left circumflex had 95% proximal stenosis.  The RCA had a 95% distal stenosis before the posterior descending branch which was completely occluded and filled very faintly by collaterals.  There was a large posterolateral branch.  Left ventricular ejection fraction was 45%-50% visually with an LVEDP of 10.   The patient lives at home with his wife.  His wife, daughter and son are with him today. He stays active mowing his grass and working in his yard.  He is a lifelong non-smoker.  Dr. Laneta Simmers reviewed the patient's diagnostic studies and determined he would benefit from surgical intervention. He reviewed the treatment options as well as the risks and benefits of surgery with the patient. Mr. Weisenburger was agreeable to proceed with surgery.  Hospital Course: Mr. Hoctor presented to [facility] and was brought to the operating room on 08/08/23. He underwent CABG x 3 utilizing LIMA to LAD, SVG to PL and SVG to OM as well as endoscopic harvest of the right greater saphenous  vein. He tolerated the procedure well and was transferred to the SICU in stable condition.

## 2023-08-08 NOTE — Discharge Instructions (Signed)
 Discharge Instructions:  1. You may shower, please wash incisions daily with soap and water and keep dry.  If you wish to cover wounds with dressing you may do so but please keep clean and change daily.  No tub baths or swimming until incisions have completely healed.  If your incisions become red or develop any drainage please call our office at [phone]  2. No Driving until cleared by Dr. Sharee Pimple office and you are no longer using narcotic pain medications  3. Monitor your weight daily.. Please use the same scale and weigh at same time... If you gain 5-10 lbs in 48 hours with associated lower extremity swelling, please contact our office at [phone]  4. Fever of 101.5 for at least 24 hours with no source, please contact our office at [phone]  5. Activity- up as tolerated, please walk at least 3 times per day.  Avoid strenuous activity, no lifting, pushing, or pulling with your arms over 8-10 lbs for a minimum of 6 weeks  6. If any questions or concerns arise, please do not hesitate to contact our office at [phone]  7. Please participate in a LOW carb LOW sugar diet and monitor your blood sugar this weekend since we have held your Metaglip

## 2023-08-08 NOTE — Anesthesia Procedure Notes (Signed)
 Procedure Name: Intubation Date/Time: 08/08/2023 7:48 AM  Performed by: Alwyn Ren, CRNAPre-anesthesia Checklist: Patient identified, Emergency Drugs available, Suction available and Patient being monitored Patient Re-evaluated:Patient Re-evaluated prior to induction Oxygen Delivery Method: Circle system utilized Preoxygenation: Pre-oxygenation with 100% oxygen Induction Type: IV induction Ventilation: Mask ventilation without difficulty Grade View: Grade I Tube type: Oral Tube size: 8.0 mm Number of attempts: 1 Airway Equipment and Method: Stylet and Oral airway Placement Confirmation: ETT inserted through vocal cords under direct vision, positive ETCO2 and breath sounds checked- equal and bilateral Secured at: 24 cm Tube secured with: Tape Dental Injury: Teeth and Oropharynx as per pre-operative assessment  Comments: IStephan Minister SRNA

## 2023-08-08 NOTE — Anesthesia Procedure Notes (Signed)
 Arterial Line Insertion Start/End4/11/2023 6:50 AM, 08/08/2023 6:55 AM Performed by: Alwyn Ren, CRNA, CRNA  Patient location: Pre-op. Preanesthetic checklist: patient identified, IV checked, site marked, risks and benefits discussed, surgical consent, monitors and equipment checked, pre-op evaluation, timeout performed and anesthesia consent Lidocaine 1% used for infiltration radial was placed Catheter size: 20 G Hand hygiene performed , maximum sterile barriers used  and Seldinger technique used  Attempts: 1 Procedure performed without using ultrasound guided technique. Ultrasound Notes:anatomy identified, needle tip was noted to be adjacent to the nerve/plexus identified and no ultrasound evidence of intravascular and/or intraneural injection Following insertion, dressing applied and Biopatch. Patient tolerated the procedure well with no immediate complications. Additional procedure comments: I. Kolencherry SRNA.

## 2023-08-09 ENCOUNTER — Other Ambulatory Visit: Payer: Self-pay

## 2023-08-09 ENCOUNTER — Inpatient Hospital Stay (HOSPITAL_COMMUNITY)

## 2023-08-09 ENCOUNTER — Encounter (HOSPITAL_COMMUNITY): Payer: Self-pay | Admitting: Surgery

## 2023-08-09 LAB — GLUCOSE, CAPILLARY
Glucose-Capillary: 123 mg/dL — ABNORMAL HIGH (ref 70–99)
Glucose-Capillary: 131 mg/dL — ABNORMAL HIGH (ref 70–99)
Glucose-Capillary: 142 mg/dL — ABNORMAL HIGH (ref 70–99)
Glucose-Capillary: 143 mg/dL — ABNORMAL HIGH (ref 70–99)
Glucose-Capillary: 158 mg/dL — ABNORMAL HIGH (ref 70–99)
Glucose-Capillary: 160 mg/dL — ABNORMAL HIGH (ref 70–99)
Glucose-Capillary: 164 mg/dL — ABNORMAL HIGH (ref 70–99)
Glucose-Capillary: 167 mg/dL — ABNORMAL HIGH (ref 70–99)
Glucose-Capillary: 170 mg/dL — ABNORMAL HIGH (ref 70–99)
Glucose-Capillary: 183 mg/dL — ABNORMAL HIGH (ref 70–99)
Glucose-Capillary: 216 mg/dL — ABNORMAL HIGH (ref 70–99)
Glucose-Capillary: 219 mg/dL — ABNORMAL HIGH (ref 70–99)

## 2023-08-09 LAB — BPAM PLATELET PHERESIS
Blood Product Expiration Date: 202504092359
ISSUE DATE / TIME: 202504071158
Unit Type and Rh: 6200

## 2023-08-09 LAB — BPAM RBC
Blood Product Expiration Date: 202505012359
Blood Product Expiration Date: 202505032359
ISSUE DATE / TIME: 202504071125
ISSUE DATE / TIME: 202504071125
Unit Type and Rh: 6200
Unit Type and Rh: 6200

## 2023-08-09 LAB — BASIC METABOLIC PANEL WITH GFR
Anion gap: 9 (ref 5–15)
Anion gap: 10 (ref 5–15)
BUN: 20 mg/dL (ref 8–23)
BUN: 18 mg/dL (ref 8–23)
CO2: 22 mmol/L (ref 22–32)
CO2: 23 mmol/L (ref 22–32)
Calcium: 7.9 mg/dL — ABNORMAL LOW (ref 8.9–10.3)
Calcium: 8.1 mg/dL — ABNORMAL LOW (ref 8.9–10.3)
Chloride: 105 mmol/L (ref 98–111)
Chloride: 101 mmol/L (ref 98–111)
Creatinine, Ser: 1.17 mg/dL (ref 0.61–1.24)
Creatinine, Ser: 1.4 mg/dL — ABNORMAL HIGH (ref 0.61–1.24)
GFR, Estimated: >60 mL/min (ref >60)
GFR, Estimated: 50 mL/min — ABNORMAL LOW (ref >60)
Glucose, Bld: 125 mg/dL — ABNORMAL HIGH (ref 70–99)
Glucose, Bld: 166 mg/dL — ABNORMAL HIGH (ref 70–99)
Potassium: 3.8 mmol/L (ref 3.5–5.1)
Potassium: 3.6 mmol/L (ref 3.5–5.1)
Sodium: 136 mmol/L (ref 135–145)
Sodium: 134 mmol/L — ABNORMAL LOW (ref 135–145)

## 2023-08-09 LAB — TYPE AND SCREEN
ABO/RH(D): A POS
Antibody Screen: NEGATIVE
Unit division: 0
Unit division: 0

## 2023-08-09 LAB — MAGNESIUM
Magnesium: 2.5 mg/dL — ABNORMAL HIGH (ref 1.7–2.4)
Magnesium: 2.5 mg/dL — ABNORMAL HIGH (ref 1.7–2.4)

## 2023-08-09 LAB — PREPARE PLATELET PHERESIS: Unit division: 0

## 2023-08-09 LAB — CBC
HCT: 31 % — ABNORMAL LOW (ref 39.0–52.0)
HCT: 34.1 % — ABNORMAL LOW (ref 39.0–52.0)
Hemoglobin: 10.7 g/dL — ABNORMAL LOW (ref 13.0–17.0)
Hemoglobin: 11.7 g/dL — ABNORMAL LOW (ref 13.0–17.0)
MCH: 31.4 pg (ref 26.0–34.0)
MCH: 31.5 pg (ref 26.0–34.0)
MCHC: 34.5 g/dL (ref 30.0–36.0)
MCHC: 34.3 g/dL (ref 30.0–36.0)
MCV: 90.9 fL (ref 80.0–100.0)
MCV: 91.7 fL (ref 80.0–100.0)
Platelets: 101 10*3/uL — ABNORMAL LOW (ref 150–400)
Platelets: 105 10*3/uL — ABNORMAL LOW (ref 150–400)
RBC: 3.41 MIL/uL — ABNORMAL LOW (ref 4.22–5.81)
RBC: 3.72 MIL/uL — ABNORMAL LOW (ref 4.22–5.81)
RDW: 14.2 % (ref 11.5–15.5)
RDW: 14.3 % (ref 11.5–15.5)
WBC: 8.1 10*3/uL (ref 4.0–10.5)
WBC: 9.1 10*3/uL (ref 4.0–10.5)
nRBC: 0 % (ref 0.0–0.2)
nRBC: 0 % (ref 0.0–0.2)

## 2023-08-09 MED ORDER — TRAMADOL HCL 50 MG PO TABS: 50.0000 mg | ORAL_TABLET | ORAL | Status: DC | PRN

## 2023-08-09 MED ORDER — POTASSIUM CHLORIDE 10 MEQ/50ML IV SOLN
10.0000 meq | INTRAVENOUS | Status: AC
  Administered 2023-08-09 (×2): 10 meq via INTRAVENOUS
  Filled 2023-08-09 (×2): qty 50

## 2023-08-09 MED ORDER — ASPIRIN 81 MG PO CHEW
324.0000 mg | CHEWABLE_TABLET | Freq: Every day | ORAL | Status: DC
Start: 2023-08-09 — End: 2023-08-10
  Administered 2023-08-09: 324 mg via ORAL

## 2023-08-09 MED ORDER — POTASSIUM CHLORIDE 20 MEQ PO PACK
20.0000 meq | PACK | ORAL | Status: AC
  Administered 2023-08-09 – 2023-08-10 (×3): 20 meq via ORAL
  Filled 2023-08-09 (×3): qty 1

## 2023-08-09 MED ORDER — OXYCODONE HCL 5 MG/5ML PO SOLN
5.0000 mg | ORAL | Status: DC | PRN
  Administered 2023-08-09: 5 mg via ORAL
  Filled 2023-08-09: qty 5

## 2023-08-09 MED ORDER — INSULIN ASPART 100 UNIT/ML IJ SOLN
0.0000 [IU] | INTRAMUSCULAR | Status: DC
  Administered 2023-08-09: 8 [IU] via SUBCUTANEOUS
  Administered 2023-08-09: 4 [IU] via SUBCUTANEOUS
  Administered 2023-08-09: 2 [IU] via SUBCUTANEOUS
  Administered 2023-08-09: 4 [IU] via SUBCUTANEOUS

## 2023-08-09 MED ORDER — ASPIRIN 325 MG PO TBEC
325.0000 mg | DELAYED_RELEASE_TABLET | Freq: Every day | ORAL | Status: DC
  Administered 2023-08-10: 325 mg via ORAL
  Filled 2023-08-09: qty 1

## 2023-08-09 MED ORDER — INSULIN GLARGINE-YFGN 100 UNIT/ML ~~LOC~~ SOLN
25.0000 [IU] | Freq: Every day | SUBCUTANEOUS | Status: DC
  Administered 2023-08-09 – 2023-08-10 (×2): 25 [IU] via SUBCUTANEOUS
  Filled 2023-08-09 (×3): qty 0.25

## 2023-08-09 MED ORDER — OXYCODONE HCL 5 MG PO TABS: 5.0000 mg | ORAL_TABLET | ORAL | Status: DC | PRN

## 2023-08-09 MED ORDER — CHLORHEXIDINE GLUCONATE CLOTH 2 % EX PADS
6.0000 | MEDICATED_PAD | Freq: Every day | CUTANEOUS | Status: DC
  Administered 2023-08-09 – 2023-08-10 (×2): 6 via TOPICAL

## 2023-08-09 MED FILL — Potassium Chloride Inj 2 mEq/ML: INTRAVENOUS | Qty: 40 | Status: AC

## 2023-08-09 MED FILL — Thrombin (Recombinant) For Soln 20000 Unit: CUTANEOUS | Qty: 1 | Status: AC

## 2023-08-09 MED FILL — Magnesium Sulfate Inj 50%: INTRAMUSCULAR | Qty: 10 | Status: AC

## 2023-08-09 MED FILL — Heparin Sodium (Porcine) Inj 1000 Unit/ML: Qty: 1000 | Status: AC

## 2023-08-09 NOTE — Plan of Care (Signed)
  Problem: Clinical Measurements: Goal: Diagnostic test results will improve Outcome: Progressing Goal: Respiratory complications will improve Outcome: Progressing Goal: Cardiovascular complication will be avoided Outcome: Progressing   Problem: Activity: Goal: Risk for activity intolerance will decrease Outcome: Progressing   Problem: Coping: Goal: Level of anxiety will decrease Outcome: Progressing   

## 2023-08-09 NOTE — Plan of Care (Signed)
  Problem: Education: Goal: Knowledge of General Education information will improve Description Including pain rating scale, medication(s)/side effects and non-pharmacologic comfort measures Outcome: Progressing   Problem: Health Behavior/Discharge Planning: Goal: Ability to manage health-related needs will improve Outcome: Progressing   Problem: Clinical Measurements: Goal: Respiratory complications will improve Outcome: Progressing Goal: Cardiovascular complication will be avoided Outcome: Progressing   Problem: Coping: Goal: Level of anxiety will decrease Outcome: Progressing

## 2023-08-09 NOTE — Progress Notes (Signed)
 EVENING ROUNDS NOTE :     [address]       [place] [address]             [phone]                 1 Day Post-Op Procedure(s) (LRB): CORONARY ARTERY BYPASS GRAFTING (CABG) TIMES THREE UTILIZING THE LEFT INTERNAL MAMMARY ARTERY AND ENDOSCOPIC VEIN HARVEST LEFT GREATER SAPHENOUS VEIN (N/A) ECHOCARDIOGRAM, TRANSESOPHAGEAL (N/A)   Total Length of Stay:  LOS: 1 day  Events:   No events Extubated Minimal CT output    BP 108/66 (BP Location: Left Arm)   Pulse 80   Temp (!) 96.1 F (35.6 C)   Resp (!) 8   Ht 5\' 10"  (1.778 m)   Wt 77.1 kg   SpO2 99%   BMI 24.39 kg/m   PAP: (12-53)/(-4-20) 16/4 CVP:  [0 mmHg-15 mmHg] 10 mmHg CO:  [3.7 L/min-5.5 L/min] 4.6 L/min CI:  [1.9 L/min/m2-2.8 L/min/m2] 2.4 L/min/m2  Vent Mode: CPAP;PSV FiO2 (%):  [40 %-50 %] 40 % Set Rate:  [4 bmp-16 bmp] 4 bmp Vt Set:  [580 mL] 580 mL PEEP:  [5 cmH20] 5 cmH20 Pressure Support:  [10 cmH20] 10 cmH20 Plateau Pressure:  [15 cmH20] 15 cmH20   sodium chloride Stopped (08/08/23 1737)   sodium chloride     albumin human 12.5 g (08/08/23 2123)    ceFAZolin (ANCEF) IV 2 g (08/08/23 2145)   dexmedetomidine (PRECEDEX) IV infusion 0.2 mcg/kg/hr (08/08/23 1900)   insulin 2.2 Units/hr (08/08/23 1900)   lactated ringers     lactated ringers 20 mL/hr at 08/08/23 1900   nitroGLYCERIN     norepinephrine (LEVOPHED) Adult infusion 1 mcg/min (08/08/23 1300)   phenylephrine (NEO-SYNEPHRINE) Adult infusion      I/O last 3 completed shifts: In: 4174 [I.V.:1548.4; Blood:913; IV Piggyback:1712.6] Out: 2655 [Urine:2385; Chest Tube:270]      Latest Ref Rng & Units 08/08/2023    8:56 PM 08/08/2023    6:54 PM 08/08/2023    6:44 PM  CBC  WBC 4.0 - 10.5 K/uL  8.7    Hemoglobin 13.0 - 17.0 g/dL 91.4  78.2  95.6   Hematocrit 39.0 - 52.0 % 30.0  34.0  32.0   Platelets 150 - 400 K/uL  94         Latest Ref Rng & Units 08/08/2023    8:56 PM 08/08/2023    6:54 PM 08/08/2023    6:44 PM  BMP  Glucose  70 - 99 mg/dL  213    BUN 8 - 23 mg/dL  27    Creatinine 0.86 - 1.24 mg/dL  5.78    Sodium 469 - 629 mmol/L 138  137  138   Potassium 3.5 - 5.1 mmol/L 3.6  4.1  4.1   Chloride 98 - 111 mmol/L  103    CO2 22 - 32 mmol/L  21    Calcium 8.9 - 10.3 mg/dL  8.2      ABG    Component Value Date/Time   PHART 7.354 08/08/2023 2056   PCO2ART 35.9 08/08/2023 2056   PO2ART 120 (H) 08/08/2023 2056   HCO3 20.4 08/08/2023 2056   TCO2 22 08/08/2023 2056   ACIDBASEDEF 5.0 (H) 08/08/2023 2056   O2SAT 99 08/08/2023 2056       Brynda Greathouse, MD 08/09/2023 1:54 AM
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
## 2023-08-09 NOTE — Progress Notes (Signed)
 1 Day Post-Op Procedure(s) (LRB): CORONARY ARTERY BYPASS GRAFTING (CABG) TIMES THREE UTILIZING THE LEFT INTERNAL MAMMARY ARTERY AND ENDOSCOPIC VEIN HARVEST LEFT GREATER SAPHENOUS VEIN (N/A) ECHOCARDIOGRAM, TRANSESOPHAGEAL (N/A) Subjective:  No complaints. Pain under good control. Doing 1750 on IS.  Objective: Vital signs in last 24 hours: Temp:  [94.6 F (34.8 C)-97.5 F (36.4 C)] 97.5 F (36.4 C) (04/08 0645) Pulse Rate:  [47-95] 80 (04/08 0645) Cardiac Rhythm: Atrial paced (04/08 0400) Resp:  [1-24] 18 (04/08 0645) BP: (80-108)/(44-69) 102/68 (04/08 0600) SpO2:  [92 %-100 %] 98 % (04/08 0645) Arterial Line BP: (84-162)/(37-79) 103/45 (04/08 0645) FiO2 (%):  [40 %-50 %] 40 % (04/07 1820) Weight:  [80.4 kg] 80.4 kg (04/08 0620)  Hemodynamic parameters for last 24 hours: PAP: (12-53)/(-4-20) 18/5 CVP:  [0 mmHg-15 mmHg] 10 mmHg CO:  [3.7 L/min-5.5 L/min] 4.7 L/min CI:  [1.9 L/min/m2-2.8 L/min/m2] 2.38 L/min/m2  Intake/Output from previous day: 04/07 0701 - 04/08 0700 In: 5345 [I.V.:2019.4; Blood:913; IV Piggyback:2412.6] Out: 4870 [Urine:4320; Chest Tube:550] Intake/Output this shift: Total I/O In: 1171 [I.V.:471; IV Piggyback:700] Out: 2215 [Urine:1935; Chest Tube:280]  General appearance: alert and cooperative Neurologic: intact Heart: regular rate and rhythm Lungs: clear to auscultation bilaterally Extremities: no edema Wound: dressings dry  Lab Results: Recent Labs    08/08/23 1854 08/08/23 2056 08/09/23 0355  WBC 8.7  --  8.1  HGB 11.6* 10.2* 10.7*  HCT 34.0* 30.0* 31.0*  PLT 94*  --  101*   BMET:  Recent Labs    08/08/23 1854 08/08/23 2056 08/09/23 0355  NA 137 138 136  K 4.1 3.6 3.8  CL 103  --  105  CO2 21*  --  22  GLUCOSE 158*  --  125*  BUN 27*  --  20  CREATININE 1.41*  --  1.17  CALCIUM 8.2*  --  7.9*    PT/INR:  Recent Labs    08/08/23 1344  LABPROT 17.7*  INR 1.4*   ABG    Component Value Date/Time   PHART 7.354 08/08/2023  2056   HCO3 20.4 08/08/2023 2056   TCO2 22 08/08/2023 2056   ACIDBASEDEF 5.0 (H) 08/08/2023 2056   O2SAT 99 08/08/2023 2056   CBG (last 3)  Recent Labs    08/09/23 0302 08/09/23 0355 08/09/23 0650  GLUCAP 131* 123* 167*   CXR: clear  ECG pending  Assessment/Plan: S/P Procedure(s) (LRB): CORONARY ARTERY BYPASS GRAFTING (CABG) TIMES THREE UTILIZING THE LEFT INTERNAL MAMMARY ARTERY AND ENDOSCOPIC VEIN HARVEST LEFT GREATER SAPHENOUS VEIN (N/A) ECHOCARDIOGRAM, TRANSESOPHAGEAL (N/A)  POD 1 Hemodynamically stable on low dose NE. Wean as tolerated. Rhythm is marked sinus brady. No Lopressor. Continue AAI pacing at 80 to support BP. He was on Toprol 25 preop with HR 50's and down in the 40's when under anesthesia.  DM: transition to [facility] and SSI.  Dangle and DC chest tubes today.  DC swan and arterial line.  IS, OOB, mobilize.   LOS: 1 day    Alleen Borne 08/09/2023

## 2023-08-10 ENCOUNTER — Inpatient Hospital Stay (HOSPITAL_COMMUNITY)

## 2023-08-10 LAB — CBC
HCT: 34.1 % — ABNORMAL LOW (ref 39.0–52.0)
Hemoglobin: 11.6 g/dL — ABNORMAL LOW (ref 13.0–17.0)
MCH: 31.3 pg (ref 26.0–34.0)
MCHC: 34 g/dL (ref 30.0–36.0)
MCV: 91.9 fL (ref 80.0–100.0)
Platelets: 90 10*3/uL — ABNORMAL LOW (ref 150–400)
RBC: 3.71 MIL/uL — ABNORMAL LOW (ref 4.22–5.81)
RDW: 14.4 % (ref 11.5–15.5)
WBC: 7.2 10*3/uL (ref 4.0–10.5)
nRBC: 0 % (ref 0.0–0.2)

## 2023-08-10 LAB — BASIC METABOLIC PANEL WITH GFR
Anion gap: 9 (ref 5–15)
BUN: 14 mg/dL (ref 8–23)
CO2: 24 mmol/L (ref 22–32)
Calcium: 8.7 mg/dL — ABNORMAL LOW (ref 8.9–10.3)
Chloride: 105 mmol/L (ref 98–111)
Creatinine, Ser: 1.22 mg/dL (ref 0.61–1.24)
GFR, Estimated: 58 mL/min — ABNORMAL LOW (ref >60)
Glucose, Bld: 131 mg/dL — ABNORMAL HIGH (ref 70–99)
Potassium: 3.9 mmol/L (ref 3.5–5.1)
Sodium: 138 mmol/L (ref 135–145)

## 2023-08-10 LAB — GLUCOSE, CAPILLARY
Glucose-Capillary: 116 mg/dL — ABNORMAL HIGH (ref 70–99)
Glucose-Capillary: 201 mg/dL — ABNORMAL HIGH (ref 70–99)
Glucose-Capillary: 146 mg/dL — ABNORMAL HIGH (ref 70–99)
Glucose-Capillary: 134 mg/dL — ABNORMAL HIGH (ref 70–99)
Glucose-Capillary: 90 mg/dL (ref 70–99)

## 2023-08-10 MED ORDER — SODIUM CHLORIDE 0.9% FLUSH
3.0000 mL | Freq: Two times a day (BID) | INTRAVENOUS | Status: DC
  Administered 2023-08-10 – 2023-08-11 (×2): 3 mL via INTRAVENOUS

## 2023-08-10 MED ORDER — ASPIRIN 325 MG PO TBEC
325.0000 mg | DELAYED_RELEASE_TABLET | Freq: Every day | ORAL | Status: DC
  Administered 2023-08-11 – 2023-08-12 (×2): 325 mg via ORAL
  Filled 2023-08-10 (×2): qty 1

## 2023-08-10 MED ORDER — INSULIN ASPART 100 UNIT/ML IJ SOLN
0.0000 [IU] | Freq: Three times a day (TID) | INTRAMUSCULAR | Status: DC
  Administered 2023-08-10: 8 [IU] via SUBCUTANEOUS
  Administered 2023-08-10 (×2): 2 [IU] via SUBCUTANEOUS
  Administered 2023-08-11: 8 [IU] via SUBCUTANEOUS

## 2023-08-10 MED ORDER — SODIUM CHLORIDE 0.9% FLUSH: 3.0000 mL | INTRAVENOUS | Status: DC | PRN

## 2023-08-10 MED ORDER — SODIUM CHLORIDE 0.9 % IV SOLN: 250.0000 mL | INTRAVENOUS | Status: AC | PRN

## 2023-08-10 MED ORDER — METFORMIN HCL 500 MG PO TABS
500.0000 mg | ORAL_TABLET | Freq: Two times a day (BID) | ORAL | Status: DC
  Administered 2023-08-10 – 2023-08-11 (×4): 500 mg via ORAL
  Filled 2023-08-10 (×4): qty 1

## 2023-08-10 MED ORDER — HYDROCHLOROTHIAZIDE 12.5 MG PO TABS: 12.5000 mg | ORAL_TABLET | Freq: Every day | ORAL | Status: DC

## 2023-08-10 MED ORDER — ~~LOC~~ CARDIAC SURGERY, PATIENT & FAMILY EDUCATION: Freq: Once | Status: AC

## 2023-08-10 MED ORDER — METOPROLOL TARTRATE 12.5 MG HALF TABLET
12.5000 mg | ORAL_TABLET | Freq: Two times a day (BID) | ORAL | Status: DC
  Administered 2023-08-11: 12.5 mg via ORAL
  Filled 2023-08-10: qty 1

## 2023-08-10 NOTE — Progress Notes (Signed)
 2 Days Post-Op Procedure(s) (LRB): CORONARY ARTERY BYPASS GRAFTING (CABG) TIMES THREE UTILIZING THE LEFT INTERNAL MAMMARY ARTERY AND ENDOSCOPIC VEIN HARVEST LEFT GREATER SAPHENOUS VEIN (N/A) ECHOCARDIOGRAM, TRANSESOPHAGEAL (N/A) Subjective: No complaints. Sitting up eating breakfast. Had BM overnight. Ambulated 4 laps this morning.  Objective: Vital signs in last 24 hours: Temp:  [97.5 F (36.4 C)-98.6 F (37 C)] 98.4 F (36.9 C) (04/09 0400) Pulse Rate:  [65-89] 80 (04/09 0700) Cardiac Rhythm: Atrial paced (04/09 0342) Resp:  [10-25] 17 (04/09 0700) BP: (66-136)/(42-89) 118/65 (04/09 0700) SpO2:  [93 %-99 %] 96 % (04/09 0700) Arterial Line BP: (90-136)/(25-69) 117/48 (04/08 1300) Weight:  [79.7 kg] 79.7 kg (04/09 0342)  Hemodynamic parameters for last 24 hours: PAP: (17-21)/(4-6) 21/6  Intake/Output from previous day: 04/08 0701 - 04/09 0700 In: 992.4 [P.O.:360; I.V.:325.2; IV Piggyback:307.2] Out: 2285 [Urine:2165; Chest Tube:120] Intake/Output this shift: No intake/output data recorded.  General appearance: alert and cooperative Neurologic: intact Heart: regular rate and rhythm Lungs: clear to auscultation bilaterally Extremities: no edema Wound: dressing dry  Lab Results: Recent Labs    08/09/23 1518 08/10/23 0520  WBC 9.1 7.2  HGB 11.7* 11.6*  HCT 34.1* 34.1*  PLT 105* 90*   BMET:  Recent Labs    08/09/23 1518 08/10/23 0520  NA 134* 138  K 3.6 3.9  CL 101 105  CO2 23 24  GLUCOSE 166* 131*  BUN 18 14  CREATININE 1.40* 1.22  CALCIUM 8.1* 8.7*    PT/INR:  Recent Labs    08/08/23 1344  LABPROT 17.7*  INR 1.4*   ABG    Component Value Date/Time   PHART 7.354 08/08/2023 2056   HCO3 20.4 08/08/2023 2056   TCO2 22 08/08/2023 2056   ACIDBASEDEF 5.0 (H) 08/08/2023 2056   O2SAT 99 08/08/2023 2056   CBG (last 3)  Recent Labs    08/09/23 2002 08/09/23 2313 08/10/23 0339  GLUCAP 183* 164* 116*   CXR: left basilar  atelectasis  Assessment/Plan: S/P Procedure(s) (LRB): CORONARY ARTERY BYPASS GRAFTING (CABG) TIMES THREE UTILIZING THE LEFT INTERNAL MAMMARY ARTERY AND ENDOSCOPIC VEIN HARVEST LEFT GREATER SAPHENOUS VEIN (N/A) ECHOCARDIOGRAM, TRANSESOPHAGEAL (N/A)  POD 2  Hemodynamically stable in sinus rhythm 80's. NE is off. Will plan to resume low dose Lopressor tomorrow.  Good UO and - 1300 cc yesterday. Wt is 5 lbs over preop. Will hold off on diuresis today and resume his hydrochlorothiazide tomorrow.  DM: glucose under adequate control. Resume Metformin today and continue SEMGLEE and SSI.  DC pacing wires this morning and if stable for two hours will remove sleeve and foley. Then transfer to 4E and continue IS, ambulation.   LOS: 2 days    Alleen Borne 08/10/2023

## 2023-08-11 LAB — GLUCOSE, CAPILLARY
Glucose-Capillary: 98 mg/dL (ref 70–99)
Glucose-Capillary: 178 mg/dL — ABNORMAL HIGH (ref 70–99)
Glucose-Capillary: 219 mg/dL — ABNORMAL HIGH (ref 70–99)
Glucose-Capillary: 111 mg/dL — ABNORMAL HIGH (ref 70–99)

## 2023-08-11 LAB — BASIC METABOLIC PANEL WITH GFR
Anion gap: 10 (ref 5–15)
BUN: 17 mg/dL (ref 8–23)
CO2: 23 mmol/L (ref 22–32)
Calcium: 8.8 mg/dL — ABNORMAL LOW (ref 8.9–10.3)
Chloride: 104 mmol/L (ref 98–111)
Creatinine, Ser: 1.42 mg/dL — ABNORMAL HIGH (ref 0.61–1.24)
GFR, Estimated: 49 mL/min — ABNORMAL LOW (ref >60)
Glucose, Bld: 96 mg/dL (ref 70–99)
Potassium: 4.3 mmol/L (ref 3.5–5.1)
Sodium: 137 mmol/L (ref 135–145)

## 2023-08-11 LAB — CBC
HCT: 35 % — ABNORMAL LOW (ref 39.0–52.0)
Hemoglobin: 11.7 g/dL — ABNORMAL LOW (ref 13.0–17.0)
MCH: 31.2 pg (ref 26.0–34.0)
MCHC: 33.4 g/dL (ref 30.0–36.0)
MCV: 93.3 fL (ref 80.0–100.0)
Platelets: 105 10*3/uL — ABNORMAL LOW (ref 150–400)
RBC: 3.75 MIL/uL — ABNORMAL LOW (ref 4.22–5.81)
RDW: 14.5 % (ref 11.5–15.5)
WBC: 6.6 10*3/uL (ref 4.0–10.5)
nRBC: 0 % (ref 0.0–0.2)

## 2023-08-11 MED ORDER — METOPROLOL SUCCINATE ER 25 MG PO TB24
25.0000 mg | ORAL_TABLET | Freq: Every day | ORAL | Status: DC
  Administered 2023-08-12: 25 mg via ORAL
  Filled 2023-08-11: qty 1

## 2023-08-11 MED ORDER — OXYCODONE HCL 5 MG PO TABS: 5.0000 mg | ORAL_TABLET | ORAL | Status: DC | PRN

## 2023-08-11 MED ORDER — METOPROLOL TARTRATE 12.5 MG HALF TABLET
12.5000 mg | ORAL_TABLET | Freq: Two times a day (BID) | ORAL | Status: AC
  Administered 2023-08-11: 12.5 mg via ORAL
  Filled 2023-08-11: qty 1

## 2023-08-11 NOTE — Care Management Important Message (Signed)
 Important Message  Patient Details  Name: Adam Frost MRN: [id number] Date of Birth: 08-31-1940   Important Message Given:  Yes - Medicare IM     Renie Ora 08/11/2023, 11:24 AM

## 2023-08-11 NOTE — Progress Notes (Signed)
 3 Days Post-Op Procedure(s) (LRB): CORONARY ARTERY BYPASS GRAFTING (CABG) TIMES THREE UTILIZING THE LEFT INTERNAL MAMMARY ARTERY AND ENDOSCOPIC VEIN HARVEST LEFT GREATER SAPHENOUS VEIN (N/A) ECHOCARDIOGRAM, TRANSESOPHAGEAL (N/A) Subjective: No complaints. Slept well  Objective: Vital signs in last 24 hours: Temp:  [97.7 F (36.5 C)-98.6 F (37 C)] 98.2 F (36.8 C) (04/10 0404) Pulse Rate:  [58-91] 82 (04/09 1704) Cardiac Rhythm: Sinus tachycardia (04/09 1927) Resp:  [12-21] 20 (04/10 0404) BP: (90-158)/(46-95) 118/59 (04/10 0404) SpO2:  [86 %-100 %] 95 % (04/10 0404)  Hemodynamic parameters for last 24 hours:    Intake/Output from previous day: 04/09 0701 - 04/10 0700 In: 120 [P.O.:120] Out: 337 [Urine:337] Intake/Output this shift: No intake/output data recorded.  General appearance: alert and cooperative Neurologic: intact Heart: regular rate and rhythm Lungs: clear to auscultation bilaterally Extremities: no edema Wound: dressings dry  Lab Results: Recent Labs    08/10/23 0520 08/11/23 0354  WBC 7.2 6.6  HGB 11.6* 11.7*  HCT 34.1* 35.0*  PLT 90* 105*   BMET:  Recent Labs    08/10/23 0520 08/11/23 0354  NA 138 137  K 3.9 4.3  CL 105 104  CO2 24 23  GLUCOSE 131* 96  BUN 14 17  CREATININE 1.22 1.42*  CALCIUM 8.7* 8.8*    PT/INR:  Recent Labs    08/08/23 1344  LABPROT 17.7*  INR 1.4*   ABG    Component Value Date/Time   PHART 7.354 08/08/2023 2056   HCO3 20.4 08/08/2023 2056   TCO2 22 08/08/2023 2056   ACIDBASEDEF 5.0 (H) 08/08/2023 2056   O2SAT 99 08/08/2023 2056   CBG (last 3)  Recent Labs    08/10/23 1557 08/10/23 2052 08/11/23 0605  GLUCAP 134* 90 98    Assessment/Plan: S/P Procedure(s) (LRB): CORONARY ARTERY BYPASS GRAFTING (CABG) TIMES THREE UTILIZING THE LEFT INTERNAL MAMMARY ARTERY AND ENDOSCOPIC VEIN HARVEST LEFT GREATER SAPHENOUS VEIN (N/A) ECHOCARDIOGRAM, TRANSESOPHAGEAL (N/A)  POD 3 Hemodynamically stable in sinus  rhythm. Continue low dose Lopressor.  Wt is pending today. Will hold off on hydrochlorothiazide today since creat slightly higher than yesterday.  Glucose under good control. DC SEMGLEE. Continue metformin and SSI. Plan home on previous meds.  Continue IS, ambulation.  Possibly home tomorrow if no changes.   LOS: 3 days    Alleen Borne 08/11/2023

## 2023-08-11 NOTE — Progress Notes (Signed)
 CARDIAC REHAB PHASE I   PRE:  Rate/Rhythm: 84 NSR  BP:  Sitting: 106/58      SpO2: 95 RA  MODE:  Ambulation: 1880 ft    POST:  Rate/Rhythm: 113 ST  BP:  Sitting: 128/79      SpO2: 95 RA  Pt used RW to ambulate four laps in the hallway with supervision assistance. Pt steady on feet, conversing throughout session and helped to chair for education. Pt received OHS book prior to surgery. Pt educated on restrictions, heart healthy diet, ex guidelines, Move in the Tube sheet, incentive spirometer use when d/c and CRPII. Pt denies questions and was encouraged to look in the book for additional information. Referral placed to [facility].   Faustino Congress  MS, ACSM-CEP 9:36 AM 08/11/2023    Service time is from 0850 to [phone].

## 2023-08-12 ENCOUNTER — Other Ambulatory Visit (HOSPITAL_COMMUNITY): Payer: Self-pay

## 2023-08-12 ENCOUNTER — Other Ambulatory Visit: Payer: Self-pay | Admitting: Surgery

## 2023-08-12 DIAGNOSIS — I25119 Atherosclerotic heart disease of native coronary artery with unspecified angina pectoris: Secondary | ICD-10-CM

## 2023-08-12 LAB — BASIC METABOLIC PANEL WITH GFR
Anion gap: 8 (ref 5–15)
BUN: 25 mg/dL — ABNORMAL HIGH (ref 8–23)
CO2: 23 mmol/L (ref 22–32)
Calcium: 9 mg/dL (ref 8.9–10.3)
Chloride: 106 mmol/L (ref 98–111)
Creatinine, Ser: 1.58 mg/dL — ABNORMAL HIGH (ref 0.61–1.24)
GFR, Estimated: 43 mL/min — ABNORMAL LOW (ref >60)
Glucose, Bld: 134 mg/dL — ABNORMAL HIGH (ref 70–99)
Potassium: 4.1 mmol/L (ref 3.5–5.1)
Sodium: 137 mmol/L (ref 135–145)

## 2023-08-12 LAB — GLUCOSE, CAPILLARY: Glucose-Capillary: 111 mg/dL — ABNORMAL HIGH (ref 70–99)

## 2023-08-12 MED ORDER — ASPIRIN 325 MG PO TBEC: 325.0000 mg | DELAYED_RELEASE_TABLET | Freq: Every day | ORAL | Status: AC

## 2023-08-12 MED ORDER — TRAMADOL HCL 50 MG PO TABS
50.0000 mg | ORAL_TABLET | Freq: Four times a day (QID) | ORAL | 0 refills | Status: AC | PRN
  Filled 2023-08-12: qty 28, 7d supply, fill #0

## 2023-08-12 MED FILL — Mannitol IV Soln 20%: INTRAVENOUS | Qty: 500 | Status: AC

## 2023-08-12 MED FILL — Sodium Chloride IV Soln 0.9%: INTRAVENOUS | Qty: 300 | Status: AC

## 2023-08-12 MED FILL — Electrolyte-R (PH 7.4) Solution: INTRAVENOUS | Qty: 5000 | Status: AC

## 2023-08-12 MED FILL — Heparin Sodium (Porcine) Inj 1000 Unit/ML: INTRAMUSCULAR | Qty: 10 | Status: AC

## 2023-08-12 MED FILL — Sodium Bicarbonate IV Soln 8.4%: INTRAVENOUS | Qty: 100 | Status: AC

## 2023-08-12 MED FILL — Lidocaine HCl Local Soln Prefilled Syringe 100 MG/5ML (2%): INTRAMUSCULAR | Qty: 5 | Status: AC

## 2023-08-12 NOTE — Progress Notes (Signed)
 [address]       [place] [address]             [phone]      4 Days Post-Op Procedure(s) (LRB): CORONARY ARTERY BYPASS GRAFTING (CABG) TIMES THREE UTILIZING THE LEFT INTERNAL MAMMARY ARTERY AND ENDOSCOPIC VEIN HARVEST LEFT GREATER SAPHENOUS VEIN (N/A) ECHOCARDIOGRAM, TRANSESOPHAGEAL (N/A) Subjective: Patient with no new complaints, states he is ready to go home.   Objective: Vital signs in last 24 hours: Temp:  [97.7 F (36.5 C)-98.3 F (36.8 C)] 97.7 F (36.5 C) (04/11 0434) Pulse Rate:  [75-98] 75 (04/10 1121) Cardiac Rhythm: Normal sinus rhythm;Bundle branch block (04/10 1913) Resp:  [15-22] 15 (04/11 0500) BP: (98-128)/(38-79) 116/63 (04/11 0434) SpO2:  [95 %-98 %] 96 % (04/11 0500) Weight:  [79 kg] 79 kg (04/11 0500)  Hemodynamic parameters for last 24 hours:    Intake/Output from previous day: 04/10 0701 - 04/11 0700 In: 720 [P.O.:720] Out: -  Intake/Output this shift: No intake/output data recorded.  General appearance: alert, cooperative, and no distress Neurologic: intact Heart: regular rate and rhythm with PVCs, no murmur Lungs: clear to auscultation bilaterally Abdomen: soft, non-tender; bowel sounds normal; no masses,  no organomegaly Extremities: extremities normal, atraumatic, no cyanosis or edema Wound: Clean and dry without sign of infection  Lab Results: Recent Labs    08/10/23 0520 08/11/23 0354  WBC 7.2 6.6  HGB 11.6* 11.7*  HCT 34.1* 35.0*  PLT 90* 105*   BMET:  Recent Labs    08/11/23 0354 08/12/23 0337  NA 137 137  K 4.3 4.1  CL 104 106  CO2 23 23  GLUCOSE 96 134*  BUN 17 25*  CREATININE 1.42* 1.58*  CALCIUM 8.8* 9.0    PT/INR: No results for input(s): "LABPROT", "INR" in the last 72 hours. ABG    Component Value Date/Time   PHART 7.354 08/08/2023 2056   HCO3 20.4 08/08/2023 2056   TCO2 22 08/08/2023 2056   ACIDBASEDEF 5.0 (H) 08/08/2023 2056   O2SAT 99 08/08/2023 2056   CBG (last 3)   Recent Labs    08/11/23 1550 08/11/23 2049 08/12/23 0547  GLUCAP 219* 111* 111*    Assessment/Plan: S/P Procedure(s) (LRB): CORONARY ARTERY BYPASS GRAFTING (CABG) TIMES THREE UTILIZING THE LEFT INTERNAL MAMMARY ARTERY AND ENDOSCOPIC VEIN HARVEST LEFT GREATER SAPHENOUS VEIN (N/A) ECHOCARDIOGRAM, TRANSESOPHAGEAL (N/A)  CV: SBP 90s-110s on Lopressor 12.5mg  BID. NSR with PVCs and looks like some PACs at times, HR 70s.   Pulm: Saturating well on room air. Last CXR with bibasilar atelectasis. Encourage IS and ambulation.   GI: +BM, soft stool but not consistent diarrhea. Patient does not want Imodium. Tolerating a diet.   Endo: T2DM, preop A1C 6.8. On Metaglip 2.5-500mg  BID at home. CBGs 219/111/111, elevated at times on SSI. Due to elevated creatinine will hold Metglip and get outpatient bmet on Monday. If okay will restart as an outpatient per Dr. Laneta Simmers.   Renal: Cr 1.58 up from 1.42 yesterday. Looks like his baseline is around 1-1.2. Diuresis including home hydrochlorothiazide has been held. +4lbs from preop weight. Will continue to hold hydrochlorothiazide and diuresis, will get bmet on Monday and start as an outpatient per Dr. Laneta Simmers.   Expected postop ABLA: Stable, last H/H 11.7/35. Reactive thrombocytopenia improving, last plt 105,000.   DVT Prophylaxis: Lovenox held due to thrombocytopenia  Dispo: Plan to D/C home today with wife as discussed with Dr. Laneta Simmers. Will follow up on outpatient BMET Monday.  LOS: 4 days    Jenny Reichmann, PA-C 08/12/2023

## 2023-08-12 NOTE — Progress Notes (Signed)
 Discharge orders received. IV and telemetry removed.  DC paperwork completed and reviewed with pt by SWOT RN.  Pt to transfer to DC lounge, daughter aware and anticipated to be here within a couple hrs.

## 2023-08-12 NOTE — Progress Notes (Signed)
 CARDIAC REHAB PHASE I    Postop education completed. Referral for CRP2 sent to [facility].  Woodroe Chen, RN BSN 08/12/2023 8:45 AM

## 2023-08-12 NOTE — Plan of Care (Signed)
  Problem: Education: Goal: Knowledge of General Education information will improve Description: Including pain rating scale, medication(s)/side effects and non-pharmacologic comfort measures Outcome: Adequate for Discharge   Problem: Health Behavior/Discharge Planning: Goal: Ability to manage health-related needs will improve Outcome: Adequate for Discharge   Problem: Clinical Measurements: Goal: Ability to maintain clinical measurements within normal limits will improve Outcome: Adequate for Discharge Goal: Will remain free from infection Outcome: Adequate for Discharge Goal: Diagnostic test results will improve Outcome: Adequate for Discharge Goal: Respiratory complications will improve Outcome: Adequate for Discharge Goal: Cardiovascular complication will be avoided Outcome: Adequate for Discharge   Problem: Activity: Goal: Risk for activity intolerance will decrease Outcome: Adequate for Discharge   Problem: Nutrition: Goal: Adequate nutrition will be maintained Outcome: Adequate for Discharge   Problem: Coping: Goal: Level of anxiety will decrease Outcome: Adequate for Discharge   Problem: Elimination: Goal: Will not experience complications related to bowel motility Outcome: Adequate for Discharge Goal: Will not experience complications related to urinary retention Outcome: Adequate for Discharge   Problem: Pain Managment: Goal: General experience of comfort will improve and/or be controlled Outcome: Adequate for Discharge   Problem: Safety: Goal: Ability to remain free from injury will improve Outcome: Adequate for Discharge   Problem: Skin Integrity: Goal: Risk for impaired skin integrity will decrease Outcome: Adequate for Discharge   Problem: Education: Goal: Will demonstrate proper wound care and an understanding of methods to prevent future damage Outcome: Adequate for Discharge Goal: Knowledge of disease or condition will improve Outcome: Adequate  for Discharge Goal: Knowledge of the prescribed therapeutic regimen will improve Outcome: Adequate for Discharge Goal: Individualized Educational Video(s) Outcome: Adequate for Discharge   Problem: Activity: Goal: Risk for activity intolerance will decrease Outcome: Adequate for Discharge   Problem: Cardiac: Goal: Will achieve and/or maintain hemodynamic stability Outcome: Adequate for Discharge   Problem: Clinical Measurements: Goal: Postoperative complications will be avoided or minimized Outcome: Adequate for Discharge   Problem: Respiratory: Goal: Respiratory status will improve Outcome: Adequate for Discharge   Problem: Skin Integrity: Goal: Wound healing without signs and symptoms of infection Outcome: Adequate for Discharge Goal: Risk for impaired skin integrity will decrease Outcome: Adequate for Discharge   Problem: Urinary Elimination: Goal: Ability to achieve and maintain adequate renal perfusion and functioning will improve Outcome: Adequate for Discharge

## 2023-08-12 NOTE — TOC Transition Note (Signed)
 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 Family to transport home. No HH or DME needs noted.    Final next level of care: Home/Self Care Barriers to Discharge: No Barriers Identified   Patient Goals and CMS Choice Patient states their goals for this hospitalization and ongoing recovery are:: return home   Choice offered to / list presented to : NA      Discharge Placement               Home        Discharge Plan and Services Additional resources added to the After Visit Summary for       Post Acute Care Choice: NA                               Social Drivers of Health (SDOH) Interventions SDOH Screenings   Food Insecurity: No Food Insecurity (08/09/2023)  Housing: Low Risk  (08/09/2023)  Transportation Needs: No Transportation Needs (08/09/2023)  Utilities: Not At Risk (08/09/2023)  Depression (PHQ2-9): Low Risk  (04/16/2019)  Financial Resource Strain: Patient Declined (07/04/2023)   Received from [facility]  Physical Activity: Inactive (03/17/2017)  Social Connections: Socially Integrated (08/09/2023)  Stress: No Stress Concern Present (03/17/2017)  Tobacco Use: Low Risk  (08/08/2023)     Readmission Risk Interventions    08/12/2023   11:36 AM  Readmission Risk Prevention Plan  Post Dischage Appt Complete  Medication Screening Complete  Transportation Screening Complete

## 2023-08-12 NOTE — Progress Notes (Signed)
 Discharge instructions (including medications) discussed with and copy provided to patient/caregiver

## 2023-08-14 ENCOUNTER — Other Ambulatory Visit: Payer: Self-pay

## 2023-08-14 ENCOUNTER — Emergency Department
Admission: EM | Admit: 2023-08-14 | Discharge: 2023-08-14 | Disposition: A | Discharge status: Home / Self Care | Attending: Emergency Medicine | Admitting: Emergency Medicine

## 2023-08-14 DIAGNOSIS — Z951 Presence of aortocoronary bypass graft: Secondary | ICD-10-CM | POA: Insufficient documentation

## 2023-08-14 DIAGNOSIS — N189 Chronic kidney disease, unspecified: Secondary | ICD-10-CM | POA: Diagnosis not present

## 2023-08-14 DIAGNOSIS — M7981 Nontraumatic hematoma of soft tissue: Secondary | ICD-10-CM | POA: Insufficient documentation

## 2023-08-14 DIAGNOSIS — T148XXA Other injury of unspecified body region, initial encounter: Secondary | ICD-10-CM

## 2023-08-14 LAB — CBC WITH DIFFERENTIAL/PLATELET
Abs Immature Granulocytes: 0.02 10*3/uL (ref 0.00–0.07)
Basophils Absolute: 0 10*3/uL (ref 0.0–0.1)
Basophils Relative: 1 %
Eosinophils Absolute: 0.1 10*3/uL (ref 0.0–0.5)
Eosinophils Relative: 1 %
HCT: 35.2 % — ABNORMAL LOW (ref 39.0–52.0)
Hemoglobin: 11.8 g/dL — ABNORMAL LOW (ref 13.0–17.0)
Immature Granulocytes: 0 %
Lymphocytes Relative: 15 %
Lymphs Abs: 0.7 10*3/uL (ref 0.7–4.0)
MCH: 31.7 pg (ref 26.0–34.0)
MCHC: 33.5 g/dL (ref 30.0–36.0)
MCV: 94.6 fL (ref 80.0–100.0)
Monocytes Absolute: 0.5 10*3/uL (ref 0.1–1.0)
Monocytes Relative: 11 %
Neutro Abs: 3.3 10*3/uL (ref 1.7–7.7)
Neutrophils Relative %: 72 %
Platelets: 151 10*3/uL (ref 150–400)
RBC: 3.72 MIL/uL — ABNORMAL LOW (ref 4.22–5.81)
RDW: 13.7 % (ref 11.5–15.5)
WBC: 4.7 10*3/uL (ref 4.0–10.5)
nRBC: 0 % (ref 0.0–0.2)

## 2023-08-14 LAB — BASIC METABOLIC PANEL WITH GFR
Anion gap: 11 (ref 5–15)
BUN: 29 mg/dL — ABNORMAL HIGH (ref 8–23)
CO2: 24 mmol/L (ref 22–32)
Calcium: 9.4 mg/dL (ref 8.9–10.3)
Chloride: 103 mmol/L (ref 98–111)
Creatinine, Ser: 1.48 mg/dL — ABNORMAL HIGH (ref 0.61–1.24)
GFR, Estimated: 46 mL/min — ABNORMAL LOW (ref >60)
Glucose, Bld: 217 mg/dL — ABNORMAL HIGH (ref 70–99)
Potassium: 3.8 mmol/L (ref 3.5–5.1)
Sodium: 138 mmol/L (ref 135–145)

## 2023-08-14 LAB — PROTIME-INR
INR: 1.1 (ref 0.8–1.2)
Prothrombin Time: 14.1 s (ref 11.4–15.2)

## 2023-08-14 NOTE — ED Triage Notes (Addendum)
 Patient arrives via EMS C/O hematoma on back upper left thigh. Patient post CABG, Dc'd from hospital yesterday, so he is concerned this is related. Denies any pain at this time.Denies any blood thinners or injury to the area

## 2023-08-14 NOTE — Discharge Instructions (Signed)
 Your workup in the Emergency Department today was reassuring.  We did not find any specific abnormalities, and we believe the lumps you are feeling are some superficial collections of blood called hematomas.  You can read through the included information.  Use either heat or cold packs (whichever feels better).  We recommend you drink plenty of fluids, take your regular medications and/or any new ones prescribed today, and follow up with the doctor(s) listed in these documents as recommended.  Return to the Emergency Department if you develop new or worsening symptoms that concern you.

## 2023-08-14 NOTE — ED Provider Notes (Signed)
 [facility] Provider Note    Event Date/Time   First MD Initiated Contact with Patient 08/14/23 0028     (approximate)   History   Hematoma   HPI Adam Frost is a 83 y.o. male  who is recent s/p CABG (discharged from [facility] about 2 days ago) who presents by EMS for a lump on the back of his left thigh.  He first felt it around noon yesterday, and it was not painful.  He thought maybe it was a collection of blood from the harvesting of the vein on that leg.  He said that he "rubbed it out" to where he could not feel it anymore.  However tonight he felt the area again and thought that he felt it so he decided to call EMS for evaluation.  Other than being tired and sore, he has no other symptoms.  He has been doing his rehab and he feels fine otherwise.  He does not believe he is on any anticoagulation (his medication list suggest that he is taking a full dose aspirin).  No significant swelling in his legs.  Ambulatory without any reproducible pain or tenderness.     Physical Exam   Triage Vital Signs: ED Triage Vitals  Encounter Vitals Group     BP 08/14/23 0031 (!) 183/62     Systolic BP Percentile --      Diastolic BP Percentile --      Pulse Rate 08/14/23 0034 63     Resp 08/14/23 0034 20     Temp 08/14/23 0034 98.4 F (36.9 C)     Temp Source 08/14/23 0034 Oral     SpO2 08/14/23 0034 95 %     Weight 08/14/23 0031 77.1 kg (170 lb)     Height 08/14/23 0031 1.778 m (5\' 10" )     Head Circumference --      Peak Flow --      Pain Score --      Pain Loc --      Pain Education --      Exclude from Growth Chart --     Most recent vital signs: Vitals:   08/14/23 0034 08/14/23 0130  BP:  122/61  Pulse: 63 (!) 52  Resp: 20 17  Temp: 98.4 F (36.9 C)   SpO2: 95% 94%    General: Awake, no distress.  Surprisingly well-appearing for his age and recent surgery. CV:  Good peripheral perfusion.  Regular rhythm, mild tachycardia.  Healing sternal wound  and bruising from recent surgery. Resp:  Normal effort. Speaking easily and comfortably, no accessory muscle usage nor intercostal retractions.   Abd:  No distention.  Other:  Extensive subacute bruising to the posterior and medial left lower extremity from the thigh down to the calf with a couple of sites that appear to be accessed from his surgery.  The more proximal one near his groin has a small area of induration which I suspect is a localized clot.  There is no evidence of infection.  I cannot palpate any " lump" where he indicates which is in the posterior mid thigh.  His compartments are soft and easily compressible and he has no tenderness upon palpation.  Good peripheral circulation.    ED Results / Procedures / Treatments   Labs (all labs ordered are listed, but only abnormal results are displayed) Labs Reviewed  CBC WITH DIFFERENTIAL/PLATELET - Abnormal; Notable for the following components:      Result  Value   RBC 3.72 (*)    Hemoglobin 11.8 (*)    HCT 35.2 (*)    All other components within normal limits  BASIC METABOLIC PANEL WITH GFR - Abnormal; Notable for the following components:   Glucose, Bld 217 (*)    BUN 29 (*)    Creatinine, Ser 1.48 (*)    GFR, Estimated 46 (*)    All other components within normal limits  PROTIME-INR     EKG  ED ECG REPORT I, Lynnda Sas, the attending physician, personally viewed and interpreted this ECG.  Date: 08/14/2023 EKG Time: 00: 31 Rate: 62 Rhythm: Ectopic atrial rhythm QRS Axis: normal Intervals: normal ST/T Wave abnormalities: normal Narrative Interpretation: no evidence of acute ischemia      PROCEDURES:  Critical Care performed: No  Procedures    IMPRESSION / MDM / ASSESSMENT AND PLAN / ED COURSE  I reviewed the triage vital signs and the nursing notes.                              Differential diagnosis includes, but is not limited to, cutaneous hematoma, abscess, nonspecific fascial tissue,  DVT.  Patient's presentation is most consistent with acute complicated illness / injury requiring diagnostic workup.  Labs/studies ordered: BMP, pro time-INR, CBC, EKG  Interventions/Medications given:  Medications - No data to display  (Note:  hospital course my include additional interventions and/or labs/studies not listed above.)   Reassuring exam, patient is doing well postoperatively.  Labs are all essentially normal other than his chronic kidney disease giving him a creatinine of 1.48 but that is better than it has been in the past.    I provided reassurance.  There is no indication that the lump he is feeling in his thigh is anything other than perhaps a hematoma or fascia/soft tissue collection in the muscle.  There is no evidence of active extravasation into the muscle and he has no pain or tenderness.  No indication for imaging at this time.  Patient is comfortable with the plan for outpatient follow-up which she has scheduled for later this week.  I gave my usual return precautions         FINAL CLINICAL IMPRESSION(S) / ED DIAGNOSES   Final diagnoses:  Hematoma     Rx / DC Orders   ED Discharge Orders     None        Note:  This document was prepared using Dragon voice recognition software and may include unintentional dictation errors.   Lynnda Sas, MD 08/14/23 [phone]

## 2023-08-15 ENCOUNTER — Telehealth: Payer: Self-pay | Admitting: *Deleted

## 2023-08-15 NOTE — Telephone Encounter (Signed)
 Patient's daughter, Bernabe Brew, contacted the office as patient went to the ED yesterday for what he thought was a blood clot behind his thigh. Patient was discharged with diagnosis of Hematoma and advised to follow up in our office. Per daughter, "knot" decreases in size with massage. Area is only tender to touch. Denies redness or warmth. Large bruise wraps around his thigh in photo taken by ED MD. Discussed with PA. Advised patient to continue to watch area. Advised to contact our office if knot enlarges or becomes red. Advised area can be assessed at RN visit for comparison on Thursday. Daughter verbalized understanding.

## 2023-08-17 ENCOUNTER — Other Ambulatory Visit: Payer: Self-pay | Admitting: Physician Assistant

## 2023-08-18 ENCOUNTER — Ambulatory Visit: Payer: Self-pay

## 2023-08-18 DIAGNOSIS — Z4802 Encounter for removal of sutures: Secondary | ICD-10-CM

## 2023-08-18 NOTE — Progress Notes (Signed)
 Patient arrived for nurse visit to remove suture/staples post- procedure CABGx3 08/08/23 with Dr. Sherene Dilling.  3 Sutures removed with no signs/ symptoms of infection noted.  Patient tolerated procedure well.  2 of 3 incisions well approximated. Middle incision has minimal dehiscence. No drainage present. Incisions look well no signs of infection noted. Patient/ family instructed to keep the incision sites clean and dry.  Patient/ family acknowledged instructions given.    Patient has a "knot" walnut sized on left inner thigh above his EVH site. He states that this does not hurt, but bruising at incision is sore. Advised for patient to keep watch on size and feeling. He is to contact the office back for appointment sooner if he experiences more pain at site or if the knot increases in size. He acknowledged receipt.

## 2023-09-12 ENCOUNTER — Other Ambulatory Visit: Payer: Self-pay | Admitting: Surgery

## 2023-09-12 DIAGNOSIS — I25119 Atherosclerotic heart disease of native coronary artery with unspecified angina pectoris: Secondary | ICD-10-CM

## 2023-09-14 ENCOUNTER — Ambulatory Visit (HOSPITAL_COMMUNITY)
Admission: RE | Admit: 2023-09-14 | Discharge: 2023-09-14 | Disposition: A | Discharge status: Home / Self Care | Source: Ambulatory Visit | Attending: Surgery | Admitting: Surgery

## 2023-09-14 ENCOUNTER — Encounter: Payer: Self-pay | Admitting: Surgery

## 2023-09-14 ENCOUNTER — Ambulatory Visit: Payer: Self-pay | Attending: Surgery | Admitting: Surgery

## 2023-09-14 VITALS — BP 120/66 | HR 55 | Resp 20 | Ht 70.0 in | Wt 166.0 lb

## 2023-09-14 DIAGNOSIS — Z951 Presence of aortocoronary bypass graft: Secondary | ICD-10-CM

## 2023-09-14 DIAGNOSIS — I25119 Atherosclerotic heart disease of native coronary artery with unspecified angina pectoris: Secondary | ICD-10-CM

## 2023-09-14 NOTE — Progress Notes (Signed)
    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 The patient's early postoperative recovery while in the hospital was notable for an uncomplicated postoperative course. Since hospital discharge the patient reports that he has been walking at least 3 days/week without chest pain or shortness of breath.  He said that his legs still feel somewhat weak.  He is here today with his son.   Current Outpatient Medications  Medication Sig Dispense Refill   aspirin  EC 325 MG tablet Take 1 tablet (325 mg total) by mouth daily.     atorvastatin  (LIPITOR) 40 MG tablet Take 1 tablet (40 mg total) by mouth daily. 90 tablet 4   glipiZIDE  (GLUCOTROL  XL) 5 MG 24 hr tablet Take 5 mg by mouth daily with breakfast.     hydrochlorothiazide  (HYDRODIURIL ) 12.5 MG tablet Take 12.5 mg by mouth daily.     metformin  (FORTAMET ) 1000 MG (OSM) 24 hr tablet Take 1,000 mg by mouth daily with breakfast.     metoprolol  succinate (TOPROL -XL) 25 MG 24 hr tablet Take 25 mg by mouth daily.     OVER THE COUNTER MEDICATION Take 2 tablets by mouth daily. VitaFusion - Omega 3/EPA/DHEA/ALA     traMADol  (ULTRAM ) 50 MG tablet Take 1 tablet (50 mg total) by mouth every 6 (six) hours as needed for moderate pain (pain score 4-6). (Patient not taking: Reported on 09/14/2023) 28 tablet 0   triamcinolone cream (KENALOG) 0.1 % Apply 1 application topically 2 (two) times daily as needed (rash).  (Patient not taking: Reported on 09/14/2023)  2   No current facility-administered medications for this visit.    Physical Exam: BP 120/66   Pulse (!) 55   Resp 20   Ht 5\' 10"  (1.778 m)   Wt 166 lb (75.3 kg)   SpO2 95%   BMI 23.82 kg/m  He looks well. Cardiac exam shows a regular rate and rhythm with normal heart sounds. Lungs are clear. The chest incision is healing well and the sternum is stable. There is no peripheral edema.  Diagnostic Tests:  Narrative &  Impression  CLINICAL DATA:  Status post coronary artery bypass graft.   EXAM: CHEST - 2 VIEW   COMPARISON:  August 10, 2023.   FINDINGS: The heart size and mediastinal contours are within normal limits. Right lung is clear. Small left pleural effusion is noted with minimal left basilar subsegmental atelectasis. The visualized skeletal structures are unremarkable.   IMPRESSION: Small left pleural effusion with minimal left basilar subsegmental atelectasis.     Electronically Signed   By: Rosalene Colon M.D.   On: 09/14/2023 16:16    Impression:  Overall I think he is doing well following his surgery.  His chest x-ray shows minimal residual left pleural effusion which I would expect to resolve on its own.  I encouraged him to continue walking is much as possible.  I told him he can return to driving a car when he feels comfortable with that but should refrain of lifting anything heavier than 10 pounds for 3 months postoperatively.  I think he can begin cardiac rehab.  Plan:  He will continue to follow-up with his PCP and cardiology in [place].  He will return to see me if he has any problems with his incisions.   Bartley Lightning, MD [facility] [phone]

## 2023-09-22 ENCOUNTER — Encounter: Attending: Cardiology | Admitting: *Deleted

## 2023-09-22 DIAGNOSIS — Z951 Presence of aortocoronary bypass graft: Secondary | ICD-10-CM | POA: Insufficient documentation

## 2023-09-22 NOTE — Progress Notes (Signed)
 Virtual orientation call completed today. he has an appointment on Date: 09/28/2023 for EP eval and gym Orientation.  Documentation of diagnosis can be found in [facility] 08/08/2023 .

## 2023-09-28 ENCOUNTER — Encounter

## 2023-09-28 VITALS — Ht 71.1 in | Wt 163.9 lb

## 2023-09-28 DIAGNOSIS — Z951 Presence of aortocoronary bypass graft: Secondary | ICD-10-CM | POA: Diagnosis present

## 2023-09-28 NOTE — Progress Notes (Signed)
 Cardiac Individual Treatment Plan  Patient Details  Name: Adam Frost MRN: [id number] Date of Birth: 1940/09/18 Referring Provider:   Flowsheet Row Cardiac Rehab from 09/28/2023 in [facility] Cardiac and Pulmonary Rehab  Referring Provider Percival Brace, MD       Initial Encounter Date:  Flowsheet Row Cardiac Rehab from 09/28/2023 in [facility] Cardiac and Pulmonary Rehab  Date 09/28/23       Visit Diagnosis: S/P CABG x 3  Patient's Home Medications on Admission:  Current Outpatient Medications:    aspirin  EC 325 MG tablet, Take 1 tablet (325 mg total) by mouth daily., Disp: , Rfl:    atorvastatin  (LIPITOR) 40 MG tablet, Take 1 tablet (40 mg total) by mouth daily., Disp: 90 tablet, Rfl: 4   glipiZIDE  (GLUCOTROL  XL) 5 MG 24 hr tablet, Take 5 mg by mouth daily with breakfast., Disp: , Rfl:    hydrochlorothiazide  (HYDRODIURIL ) 12.5 MG tablet, Take 12.5 mg by mouth daily., Disp: , Rfl:    metformin  (FORTAMET ) 1000 MG (OSM) 24 hr tablet, Take 1,000 mg by mouth daily with breakfast., Disp: , Rfl:    metoprolol  succinate (TOPROL -XL) 25 MG 24 hr tablet, Take 25 mg by mouth daily., Disp: , Rfl:    OVER THE COUNTER MEDICATION, Take 2 tablets by mouth daily. VitaFusion - Omega 3/EPA/DHEA/ALA, Disp: , Rfl:    traMADol  (ULTRAM ) 50 MG tablet, Take 1 tablet (50 mg total) by mouth every 6 (six) hours as needed for moderate pain (pain score 4-6). 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   Coronary artery disease    Diabetes mellitus without complication (HCC)    Hemorrhoid    Hyperlipidemia    Hypertension     Tobacco Use: Social History   Tobacco Use  Smoking Status Never  Smokeless Tobacco Never     Labs: Review Flowsheet  More data exists      Latest Ref Rng & Units 10/11/2017 04/13/2018 10/10/2018 08/05/2023 08/08/2023  Labs for ITP Cardiac and Pulmonary Rehab  Cholestrol 100 - 199 mg/dL 841  324  - - -  LDL (calc) 0 - 99 mg/dL - 401  - - -  HDL-C >02 mg/dL - 49  - - -  Trlycerides 0 - 149 mg/dL 63  72  - - -  Hemoglobin A1c 4.8 - 5.6 % 6.3  6.3  6.5  6.8  -  PH, Arterial 7.35 - 7.45 - - - - 7.354  7.399  7.390  7.399  7.427  7.302  7.385  7.46   PCO2 arterial 32 - 48 mmHg - - - - 35.9  35.6  36.0  39.1  36.5  43.1  30.1  25   Bicarbonate 20.0 - 28.0 mmol/L - - - - 20.4  22.4  22.1  24.1  24.0  35.2  21.3  18.0  17.8   TCO2 22 - 32 mmol/L - - - - 22  24  23  25  25  25  23   37  23  21  19  21    Acid-base deficit 0.0 - 2.0 mmol/L - - - - 5.0  3.0  3.0  1.0  5.0  6.0  4.4   O2 Saturation % - - - - 99  99  98  100  100  86  100  100  99.9     Details       Multiple values from one day are sorted in reverse-chronological order          Exercise Target Goals: Exercise Program Goal: Individual exercise prescription set using results from initial 6 min walk test and THRR while considering  patient's activity barriers and safety.   Exercise Prescription Goal: Initial exercise prescription builds to 30-45 minutes a day of aerobic activity, 2-3 days per week.  Home exercise guidelines will be given to patient during program as part of exercise prescription that the participant will acknowledge.   Education: Aerobic Exercise: - Group verbal and visual presentation on the components of exercise prescription. Introduces F.I.T.T principle from ACSM for exercise prescriptions.  Reviews F.I.T.T. principles of aerobic exercise including progression. Written material given at graduation.   Education: Resistance Exercise: - Group verbal and visual presentation on the components of exercise prescription. Introduces F.I.T.T principle from ACSM for exercise prescriptions  Reviews F.I.T.T.  principles of resistance exercise including progression. Written material given at graduation.    Education: Exercise & Equipment Safety: - Individual verbal instruction and demonstration of equipment use and safety with use of the equipment. Flowsheet Row Cardiac Rehab from 09/28/2023 in [facility] Cardiac and Pulmonary Rehab  Date 09/28/23  Educator MB  Instruction Review Code 1- Verbalizes Understanding       Education: Exercise Physiology & General Exercise Guidelines: - Group verbal and written instruction with models to review the exercise physiology of the cardiovascular system and associated critical values. Provides general exercise guidelines with specific guidelines to those with heart or lung disease.    Education: Flexibility, Balance, Mind/Body Relaxation: - Group verbal and visual presentation with interactive activity on the components of exercise prescription. Introduces F.I.T.T principle from ACSM for exercise prescriptions. Reviews F.I.T.T. principles of flexibility and balance exercise training including progression. Also discusses the mind body connection.  Reviews various relaxation techniques to help reduce and manage stress (i.e. Deep breathing, progressive muscle relaxation, and visualization). Balance handout provided to take home. Written material given at graduation.   Activity Barriers & Risk Stratification:  Activity Barriers & Cardiac Risk Stratification - 09/28/23 1045       Activity Barriers & Cardiac Risk Stratification   Activity Barriers Back Problems;Assistive Device;Other (comment)    Comments Lifting restriction of no more than 10 lbs until 11/07/2023. Wears a back brace and uses a cane in public    Cardiac Risk Stratification High             6 Minute Walk:  6 Minute Walk     Row Name 09/28/23 1044         6 Minute Walk   Phase Initial     Distance 1140 feet     Walk Time 6 minutes     # of Rest Breaks 0     MPH 2.16     METS 2.03      RPE 13     Perceived Dyspnea  0     VO2 Peak 7.09     Symptoms No     Resting HR 52 bpm     Resting BP 140/60     Resting Oxygen Saturation  98 %     Exercise Oxygen Saturation  during 6 min walk 97 %  Max Ex. HR 72 bpm     Max Ex. BP 144/60     2 Minute Post BP 100/60              Oxygen Initial Assessment:   Oxygen Re-Evaluation:   Oxygen Discharge (Final Oxygen Re-Evaluation):   Initial Exercise Prescription:  Initial Exercise Prescription - 09/28/23 1000       Date of Initial Exercise RX and Referring Provider   Date 09/28/23    Referring Provider Percival Brace, MD      Oxygen   Maintain Oxygen Saturation 88% or higher      Treadmill   MPH 1.4    Grade 0    Minutes 15    METs 2.07      Recumbant Bike   Level 2    RPM 50    Watts 25    Minutes 15    METs 2.03      NuStep   Level 2   T4 and T6   SPM 80    Minutes 15    METs 2.03      T5 Nustep   Level 2    SPM 80    Minutes 15    METs 2.03      Track   Laps 22    Minutes 15    METs 2.2      Prescription Details   Frequency (times per week) 3    Duration Progress to 30 minutes of continuous aerobic without signs/symptoms of physical distress      Intensity   THRR 40-80% of Max Heartrate 85-119    Ratings of Perceived Exertion 11-13    Perceived Dyspnea 0-4      Progression   Progression Continue to progress workloads to maintain intensity without signs/symptoms of physical distress.      Resistance Training   Training Prescription Yes    Weight 3 lb    Reps 10-15             Perform Capillary Blood Glucose checks as needed.  Exercise Prescription Changes:   Exercise Prescription Changes     Row Name 09/28/23 1000             Response to Exercise   Blood Pressure (Admit) 140/60       Blood Pressure (Exercise) 144/60       Blood Pressure (Exit) 100/60       Heart Rate (Admit) 52 bpm       Heart Rate (Exercise) 72 bpm       Heart Rate (Exit) 56 bpm        Oxygen Saturation (Admit) 98 %       Oxygen Saturation (Exercise) 97 %       Oxygen Saturation (Exit) 98 %       Rating of Perceived Exertion (Exercise) 13       Perceived Dyspnea (Exercise) 0       Symptoms none       Comments results       Intensity THRR New         Progression   Average METs 2.03                Exercise Comments:   Exercise Goals and Review:   Exercise Goals     Row Name 09/28/23 1049             Exercise Goals   Increase Physical Activity Yes  Intervention Provide advice, education, support and counseling about physical activity/exercise needs.;Develop an individualized exercise prescription for aerobic and resistive training based on initial evaluation findings, risk stratification, comorbidities and participant's personal goals.       Expected Outcomes Short Term: Attend rehab on a regular basis to increase amount of physical activity.;Long Term: Add in home exercise to make exercise part of routine and to increase amount of physical activity.;Long Term: Exercising regularly at least 3-5 days a week.       Increase Strength and Stamina Yes       Intervention Provide advice, education, support and counseling about physical activity/exercise needs.;Develop an individualized exercise prescription for aerobic and resistive training based on initial evaluation findings, risk stratification, comorbidities and participant's personal goals.       Expected Outcomes Short Term: Increase workloads from initial exercise prescription for resistance, speed, and METs.;Short Term: Perform resistance training exercises routinely during rehab and add in resistance training at home;Long Term: Improve cardiorespiratory fitness, muscular endurance and strength as measured by increased METs and functional capacity ( )       Able to understand and use rate of perceived exertion (RPE) scale Yes       Intervention Provide education and explanation on how to use  RPE scale       Expected Outcomes Short Term: Able to use RPE daily in rehab to express subjective intensity level;Long Term:  Able to use RPE to guide intensity level when exercising independently       Able to understand and use Dyspnea scale Yes       Intervention Provide education and explanation on how to use Dyspnea scale       Expected Outcomes Short Term: Able to use Dyspnea scale daily in rehab to express subjective sense of shortness of breath during exertion;Long Term: Able to use Dyspnea scale to guide intensity level when exercising independently       Knowledge and understanding of Target Heart Rate Range (THRR) Yes       Intervention Provide education and explanation of THRR including how the numbers were predicted and where they are located for reference       Expected Outcomes Short Term: Able to state/look up THRR;Short Term: Able to use daily as guideline for intensity in rehab;Long Term: Able to use THRR to govern intensity when exercising independently       Able to check pulse independently Yes       Intervention Provide education and demonstration on how to check pulse in carotid and radial arteries.;Review the importance of being able to check your own pulse for safety during independent exercise       Expected Outcomes Short Term: Able to explain why pulse checking is important during independent exercise;Long Term: Able to check pulse independently and accurately       Understanding of Exercise Prescription Yes       Intervention Provide education, explanation, and written materials on patient's individual exercise prescription       Expected Outcomes Short Term: Able to explain program exercise prescription;Long Term: Able to explain home exercise prescription to exercise independently                Exercise Goals Re-Evaluation :   Discharge Exercise Prescription (Final Exercise Prescription Changes):  Exercise Prescription Changes - 09/28/23 1000        Response to Exercise   Blood Pressure (Admit) 140/60    Blood Pressure (Exercise) 144/60    Blood Pressure (  Exit) 100/60    Heart Rate (Admit) 52 bpm    Heart Rate (Exercise) 72 bpm    Heart Rate (Exit) 56 bpm    Oxygen Saturation (Admit) 98 %    Oxygen Saturation (Exercise) 97 %    Oxygen Saturation (Exit) 98 %    Rating of Perceived Exertion (Exercise) 13    Perceived Dyspnea (Exercise) 0    Symptoms none    Comments results    Intensity THRR New      Progression   Average METs 2.03             Nutrition:  Target Goals: Understanding of nutrition guidelines, daily intake of sodium 1500mg , cholesterol 200mg , calories 30% from fat and 7% or less from saturated fats, daily to have 5 or more servings of fruits and vegetables.  Education: All About Nutrition: -Group instruction provided by verbal, written material, interactive activities, discussions, models, and posters to present general guidelines for heart healthy nutrition including fat, fiber, MyPlate, the role of sodium in heart healthy nutrition, utilization of the nutrition label, and utilization of this knowledge for meal planning. Follow up email sent as well. Written material given at graduation. Flowsheet Row Cardiac Rehab from 09/28/2023 in [facility] Cardiac and Pulmonary Rehab  Education need identified 09/28/23       Biometrics:  Pre Biometrics - 09/28/23 1050       Pre Biometrics   Height 5' 11.1" (1.806 m)    Weight 163 lb 14.4 oz (74.3 kg)    Waist Circumference 39.4 inches    Hip Circumference 38.5 inches    Waist to Hip Ratio 1.02 %    BMI (Calculated) 22.79    Single Leg Stand 4.3 seconds              Nutrition Therapy Plan and Nutrition Goals:  Nutrition Therapy & Goals - 09/28/23 1052       Personal Nutrition Goals   Nutrition Goal Wants to think about scheduling appointment with RD in the coming weeks      Intervention Plan   Intervention Prescribe, educate and counsel regarding  individualized specific dietary modifications aiming towards targeted core components such as weight, hypertension, lipid management, diabetes, heart failure and other comorbidities.    Expected Outcomes Short Term Goal: Understand basic principles of dietary content, such as calories, fat, sodium, cholesterol and nutrients.             Nutrition Assessments:  MEDIFICTS Score Key: >=70 Need to make dietary changes  40-70 Heart Healthy Diet <= 40 Therapeutic Level Cholesterol Diet  Flowsheet Row Cardiac Rehab from 09/28/2023 in [facility] Cardiac and Pulmonary Rehab  Picture Your Plate Total Score on Admission 55      Picture Your Plate Scores: <29 Unhealthy dietary pattern with much room for improvement. 41-50 Dietary pattern unlikely to meet recommendations for good health and room for improvement. 51-60 More healthful dietary pattern, with some room for improvement.  >60 Healthy dietary pattern, although there may be some specific behaviors that could be improved.    Nutrition Goals Re-Evaluation:   Nutrition Goals Discharge (Final Nutrition Goals Re-Evaluation):   Psychosocial: Target Goals: Acknowledge presence or absence of significant depression and/or stress, maximize coping skills, provide positive support system. Participant is able to verbalize types and ability to use techniques and skills needed for reducing stress and depression.   Education: Stress, Anxiety, and Depression - Group verbal and visual presentation to define topics covered.  Reviews how body is  impacted by stress, anxiety, and depression.  Also discusses healthy ways to reduce stress and to treat/manage anxiety and depression.  Written material given at graduation.   Education: Sleep Hygiene -Provides group verbal and written instruction about how sleep can affect your health.  Define sleep hygiene, discuss sleep cycles and impact of sleep habits. Review good sleep hygiene tips.    Initial Review &  Psychosocial Screening:  Initial Psych Review & Screening - 09/22/23 1343       Initial Review   Current issues with None Identified      Family Dynamics   Good Support System? Yes   wife, has problems off and on, daughter close by     Barriers   Psychosocial barriers to participate in program There are no identifiable barriers or psychosocial needs.      Screening Interventions   Interventions To provide support and resources with identified psychosocial needs;Provide feedback about the scores to participant    Expected Outcomes Short Term goal: Utilizing psychosocial counselor, staff and physician to assist with identification of specific Stressors or current issues interfering with healing process. Setting desired goal for each stressor or current issue identified.;Long Term Goal: Stressors or current issues are controlled or eliminated.;Short Term goal: Identification and review with participant of any Quality of Life or Depression concerns found by scoring the questionnaire.;Long Term goal: The participant improves quality of Life and PHQ9 Scores as seen by post scores and/or verbalization of changes             Quality of Life Scores:   Quality of Life - 09/28/23 1051       Quality of Life   Select Quality of Life      Quality of Life Scores   Health/Function Pre 22.77 %    Socioeconomic Pre 21.75 %    Psych/Spiritual Pre 23.57 %    Family Pre 22.5 %    GLOBAL Pre 22.66 %            Scores of 19 and below usually indicate a poorer quality of life in these areas.  A difference of  2-3 points is a clinically meaningful difference.  A difference of 2-3 points in the total score of the Quality of Life Index has been associated with significant improvement in overall quality of life, self-image, physical symptoms, and general health in studies assessing change in quality of life.  PHQ-9: Review Flowsheet  More data exists      09/28/2023 04/16/2019 04/13/2018 10/11/2017  03/17/2017  Depression screen PHQ 2/9  Decreased Interest 0 0 0 0 0  Down, Depressed, Hopeless 0 0 0 0 0  PHQ - 2 Score 0 0 0 0 0  Altered sleeping 1 - - - 0  Tired, decreased energy 1 - - - 0  Change in appetite 1 - - - 0  Feeling bad or failure about yourself  0 - - - 0  Trouble concentrating 0 - - - 0  Moving slowly or fidgety/restless 0 - - - 0  Suicidal thoughts 0 - - - 0  PHQ-9 Score 3 - - - 0  Difficult doing work/chores Not difficult at all - - - Not difficult at all   Interpretation of Total Score  Total Score Depression Severity:  1-4 = Minimal depression, 5-9 = Mild depression, 10-14 = Moderate depression, 15-19 = Moderately severe depression, 20-27 = Severe depression   Psychosocial Evaluation and Intervention:  Psychosocial Evaluation - 09/22/23 1352  Psychosocial Evaluation & Interventions   Interventions Encouraged to exercise with the program and follow exercise prescription    Comments There are no barriers to attending the program. He live with hs wife; she and his daughter are his support. He said hi wife is off and on with her health .   he is hoping to gain back the 10 pounds he lost in the surgery/hospital process. He has heard about the program and is attendng with the recommendation of family and his Careers adviser.    Expected Outcomes STG attend all scheduled sessions, work on exercise progression. LTG able to continue with exercise progression after discharge    Continue Psychosocial Services  Follow up required by staff             Psychosocial Re-Evaluation:   Psychosocial Discharge (Final Psychosocial Re-Evaluation):   Vocational Rehabilitation: Provide vocational rehab assistance to qualifying candidates.   Vocational Rehab Evaluation & Intervention:   Education: Education Goals: Education classes will be provided on a variety of topics geared toward better understanding of heart health and risk factor modification. Participant will state  understanding/return demonstration of topics presented as noted by education test scores.  Learning Barriers/Preferences:   General Cardiac Education Topics:  AED/CPR: - Group verbal and written instruction with the use of models to demonstrate the basic use of the AED with the basic ABC's of resuscitation.   Anatomy and Cardiac Procedures: - Group verbal and visual presentation and models provide information about basic cardiac anatomy and function. Reviews the testing methods done to diagnose heart disease and the outcomes of the test results. Describes the treatment choices: Medical Management, Angioplasty, or Coronary Bypass Surgery for treating various heart conditions including Myocardial Infarction, Angina, Valve Disease, and Cardiac Arrhythmias.  Written material given at graduation.   Medication Safety: - Group verbal and visual instruction to review commonly prescribed medications for heart and lung disease. Reviews the medication, class of the drug, and side effects. Includes the steps to properly store meds and maintain the prescription regimen.  Written material given at graduation.   Intimacy: - Group verbal instruction through game format to discuss how heart and lung disease can affect sexual intimacy. Written material given at graduation..   Know Your Numbers and Heart Failure: - Group verbal and visual instruction to discuss disease risk factors for cardiac and pulmonary disease and treatment options.  Reviews associated critical values for Overweight/Obesity, Hypertension, Cholesterol, and Diabetes.  Discusses basics of heart failure: signs/symptoms and treatments.  Introduces Heart Failure Zone chart for action plan for heart failure.  Written material given at graduation.   Infection Prevention: - Provides verbal and written material to individual with discussion of infection control including proper hand washing and proper equipment cleaning during exercise  session. Flowsheet Row Cardiac Rehab from 09/28/2023 in [facility] Cardiac and Pulmonary Rehab  Date 09/28/23  Educator MB  Instruction Review Code 1- Verbalizes Understanding       Falls Prevention: - Provides verbal and written material to individual with discussion of falls prevention and safety. Flowsheet Row Cardiac Rehab from 09/28/2023 in [facility] Cardiac and Pulmonary Rehab  Date 09/28/23  Educator MB  Instruction Review Code 1- Verbalizes Understanding       Other: -Provides group and verbal instruction on various topics (see comments)   Knowledge Questionnaire Score:  Knowledge Questionnaire Score - 09/28/23 1053       Knowledge Questionnaire Score   Pre Score 23/26  Core Components/Risk Factors/Patient Goals at Admission:  Personal Goals and Risk Factors at Admission - 09/28/23 1053       Core Components/Risk Factors/Patient Goals on Admission    Weight Management Yes;Weight Gain    Intervention Weight Management: Develop a combined nutrition and exercise program designed to reach desired caloric intake, while maintaining appropriate intake of nutrient and fiber, sodium and fats, and appropriate energy expenditure required for the weight goal.;Weight Management: Provide education and appropriate resources to help participant work on and attain dietary goals.;Weight Management/Obesity: Establish reasonable short term and long term weight goals.    Admit Weight 163 lb 14.4 oz (74.3 kg)    Goal Weight: Short Term 165 lb (74.8 kg)    Goal Weight: Long Term 170 lb (77.1 kg)    Expected Outcomes Short Term: Continue to assess and modify interventions until short term weight is achieved;Long Term: Adherence to nutrition and physical activity/exercise program aimed toward attainment of established weight goal;Understanding of distribution of calorie intake throughout the day with the consumption of 4-5 meals/snacks;Weight Gain: Understanding of general recommendations  for a high calorie, high protein meal plan that promotes weight gain by distributing calorie intake throughout the day with the consumption for 4-5 meals, snacks, and/or supplements;Understanding recommendations for meals to include 15-35% energy as protein, 25-35% energy from fat, 35-60% energy from carbohydrates, less than 200mg  of dietary cholesterol, 20-35 gm of total fiber daily    Diabetes Yes    Intervention Provide education about signs/symptoms and action to take for hypo/hyperglycemia.;Provide education about proper nutrition, including hydration, and aerobic/resistive exercise prescription along with prescribed medications to achieve blood glucose in normal ranges: Fasting glucose 65-99 mg/dL    Expected Outcomes Short Term: Participant verbalizes understanding of the signs/symptoms and immediate care of hyper/hypoglycemia, proper foot care and importance of medication, aerobic/resistive exercise and nutrition plan for blood glucose control.;Long Term: Attainment of HbA1C < 7%.    Hypertension Yes    Intervention Provide education on lifestyle modifcations including regular physical activity/exercise, weight management, moderate sodium restriction and increased consumption of fresh fruit, vegetables, and low fat dairy, alcohol moderation, and smoking cessation.;Monitor prescription use compliance.    Expected Outcomes Short Term: Continued assessment and intervention until BP is < 140/38mm HG in hypertensive participants. < 130/64mm HG in hypertensive participants with diabetes, heart failure or chronic kidney disease.;Long Term: Maintenance of blood pressure at goal levels.    Lipids Yes    Intervention Provide education and support for participant on nutrition & aerobic/resistive exercise along with prescribed medications to achieve LDL 70mg , HDL >40mg .    Expected Outcomes Short Term: Participant states understanding of desired cholesterol values and is compliant with medications prescribed.  Participant is following exercise prescription and nutrition guidelines.;Long Term: Cholesterol controlled with medications as prescribed, with individualized exercise RX and with personalized nutrition plan. Value goals: LDL < 70mg , HDL > 40 mg.             Education:Diabetes - Individual verbal and written instruction to review signs/symptoms of diabetes, desired ranges of glucose level fasting, after meals and with exercise. Acknowledge that pre and post exercise glucose checks will be done for 3 sessions at entry of program. Flowsheet Row Cardiac Rehab from 09/28/2023 in [facility] Cardiac [facility] Pul[facility] Rehab  Date 09/28/23  Educator MB  Instruction Review Code 1- Verbalizes Understanding       Core Components/Risk Factors/Patient Goals Review:    Core Components/Risk Factors/Patient Goals at Discharge (Final Review):    ITP  Comments:  ITP Comments     Row Name 09/22/23 1356 09/28/23 1044         ITP Comments Virtual orientation call completed today. he has an appointment on Date: 09/28/2023  for EP eval and gym Orientation.  Documentation of diagnosis can be found in [facility] 08/08/2023 . Completed and gym orientation for cardiac rehab. Initial ITP created and sent for review to Dr. Firman Hughes, Medical Director.               Comments: Initial ITP

## 2023-09-28 NOTE — Patient Instructions (Signed)
 Patient Instructions  Patient Details  Name: Adam Frost MRN: [id number] Date of Birth: 08-27-1940 Referring Provider:  Percival Brace, MD  Below are your personal goals for exercise, nutrition, and risk factors. Our goal is to help you stay on track towards obtaining and maintaining these goals. We will be discussing your progress on these goals with you throughout the program.  Initial Exercise Prescription:  Initial Exercise Prescription - 09/28/23 1000       Date of Initial Exercise RX and Referring Provider   Date 09/28/23    Referring Provider Percival Brace, MD      Oxygen   Maintain Oxygen Saturation 88% or higher      Treadmill   MPH 1.4    Grade 0    Minutes 15    METs 2.07      Recumbant Bike   Level 2    RPM 50    Watts 25    Minutes 15    METs 2.03      NuStep   Level 2   T4 and T6   SPM 80    Minutes 15    METs 2.03      T5 Nustep   Level 2    SPM 80    Minutes 15    METs 2.03      Track   Laps 22    Minutes 15    METs 2.2      Prescription Details   Frequency (times per week) 3    Duration Progress to 30 minutes of continuous aerobic without signs/symptoms of physical distress      Intensity   THRR 40-80% of Max Heartrate 85-119    Ratings of Perceived Exertion 11-13    Perceived Dyspnea 0-4      Progression   Progression Continue to progress workloads to maintain intensity without signs/symptoms of physical distress.      Resistance Training   Training Prescription Yes    Weight 3 lb    Reps 10-15             Exercise Goals: Frequency: Be able to perform aerobic exercise two to three times per week in program working toward 2-5 days per week of home exercise.  Intensity: Work with a perceived exertion of 11 (fairly light) - 15 (hard) while following your exercise prescription.  We will make changes to your prescription with you as you progress through the program.   Duration: Be able to do 30 to 45 minutes of  continuous aerobic exercise in addition to a 5 minute warm-up and a 5 minute cool-down routine.   Nutrition Goals: Your personal nutrition goals will be established when you do your nutrition analysis with the dietician.  The following are general nutrition guidelines to follow: Cholesterol < 200mg /day Sodium < 1500mg /day Fiber: Men over 50 yrs - 30 grams per day  Personal Goals:  Personal Goals and Risk Factors at Admission - 09/28/23 1053       Core Components/Risk Factors/Patient Goals on Admission    Weight Management Yes;Weight Gain    Intervention Weight Management: Develop a combined nutrition and exercise program designed to reach desired caloric intake, while maintaining appropriate intake of nutrient and fiber, sodium and fats, and appropriate energy expenditure required for the weight goal.;Weight Management: Provide education and appropriate resources to help participant work on and attain dietary goals.;Weight Management/Obesity: Establish reasonable short term and long term weight goals.    Admit Weight 163 lb 14.4 oz (  74.3 kg)    Goal Weight: Short Term 165 lb (74.8 kg)    Goal Weight: Long Term 170 lb (77.1 kg)    Expected Outcomes Short Term: Continue to assess and modify interventions until short term weight is achieved;Long Term: Adherence to nutrition and physical activity/exercise program aimed toward attainment of established weight goal;Understanding of distribution of calorie intake throughout the day with the consumption of 4-5 meals/snacks;Weight Gain: Understanding of general recommendations for a high calorie, high protein meal plan that promotes weight gain by distributing calorie intake throughout the day with the consumption for 4-5 meals, snacks, and/or supplements;Understanding recommendations for meals to include 15-35% energy as protein, 25-35% energy from fat, 35-60% energy from carbohydrates, less than 200mg  of dietary cholesterol, 20-35 gm of total fiber  daily    Diabetes Yes    Intervention Provide education about signs/symptoms and action to take for hypo/hyperglycemia.;Provide education about proper nutrition, including hydration, and aerobic/resistive exercise prescription along with prescribed medications to achieve blood glucose in normal ranges: Fasting glucose 65-99 mg/dL    Expected Outcomes Short Term: Participant verbalizes understanding of the signs/symptoms and immediate care of hyper/hypoglycemia, proper foot care and importance of medication, aerobic/resistive exercise and nutrition plan for blood glucose control.;Long Term: Attainment of HbA1C < 7%.    Hypertension Yes    Intervention Provide education on lifestyle modifcations including regular physical activity/exercise, weight management, moderate sodium restriction and increased consumption of fresh fruit, vegetables, and low fat dairy, alcohol moderation, and smoking cessation.;Monitor prescription use compliance.    Expected Outcomes Short Term: Continued assessment and intervention until BP is < 140/28mm HG in hypertensive participants. < 130/63mm HG in hypertensive participants with diabetes, heart failure or chronic kidney disease.;Long Term: Maintenance of blood pressure at goal levels.    Lipids Yes    Intervention Provide education and support for participant on nutrition & aerobic/resistive exercise along with prescribed medications to achieve LDL 70mg , HDL >40mg .    Expected Outcomes Short Term: Participant states understanding of desired cholesterol values and is compliant with medications prescribed. Participant is following exercise prescription and nutrition guidelines.;Long Term: Cholesterol controlled with medications as prescribed, with individualized exercise RX and with personalized nutrition plan. Value goals: LDL < 70mg , HDL > 40 mg.             Tobacco Use Initial Evaluation: Social History   Tobacco Use  Smoking Status Never  Smokeless Tobacco Never     Exercise Goals and Review:  Exercise Goals     Row Name 09/28/23 1049             Exercise Goals   Increase Physical Activity Yes       Intervention Provide advice, education, support and counseling about physical activity/exercise needs.;Develop an individualized exercise prescription for aerobic and resistive training based on initial evaluation findings, risk stratification, comorbidities and participant's personal goals.       Expected Outcomes Short Term: Attend rehab on a regular basis to increase amount of physical activity.;Long Term: Add in home exercise to make exercise part of routine and to increase amount of physical activity.;Long Term: Exercising regularly at least 3-5 days a week.       Increase Strength and Stamina Yes       Intervention Provide advice, education, support and counseling about physical activity/exercise needs.;Develop an individualized exercise prescription for aerobic and resistive training based on initial evaluation findings, risk stratification, comorbidities and participant's personal goals.       Expected Outcomes Short  Term: Increase workloads from initial exercise prescription for resistance, speed, and METs.;Short Term: Perform resistance training exercises routinely during rehab and add in resistance training at home;Long Term: Improve cardiorespiratory fitness, muscular endurance and strength as measured by increased METs and functional capacity ( )       Able to understand and use rate of perceived exertion (RPE) scale Yes       Intervention Provide education and explanation on how to use RPE scale       Expected Outcomes Short Term: Able to use RPE daily in rehab to express subjective intensity level;Long Term:  Able to use RPE to guide intensity level when exercising independently       Able to understand and use Dyspnea scale Yes       Intervention Provide education and explanation on how to use Dyspnea scale       Expected Outcomes Short  Term: Able to use Dyspnea scale daily in rehab to express subjective sense of shortness of breath during exertion;Long Term: Able to use Dyspnea scale to guide intensity level when exercising independently       Knowledge and understanding of Target Heart Rate Range (THRR) Yes       Intervention Provide education and explanation of THRR including how the numbers were predicted and where they are located for reference       Expected Outcomes Short Term: Able to state/look up THRR;Short Term: Able to use daily as guideline for intensity in rehab;Long Term: Able to use THRR to govern intensity when exercising independently       Able to check pulse independently Yes       Intervention Provide education and demonstration on how to check pulse in carotid and radial arteries.;Review the importance of being able to check your own pulse for safety during independent exercise       Expected Outcomes Short Term: Able to explain why pulse checking is important during independent exercise;Long Term: Able to check pulse independently and accurately       Understanding of Exercise Prescription Yes       Intervention Provide education, explanation, and written materials on patient's individual exercise prescription       Expected Outcomes Short Term: Able to explain program exercise prescription;Long Term: Able to explain home exercise prescription to exercise independently

## 2023-10-05 ENCOUNTER — Encounter: Payer: Self-pay | Admitting: *Deleted

## 2023-10-05 ENCOUNTER — Encounter: Attending: Family Medicine

## 2023-10-05 DIAGNOSIS — Z48812 Encounter for surgical aftercare following surgery on the circulatory system: Secondary | ICD-10-CM | POA: Insufficient documentation

## 2023-10-05 DIAGNOSIS — Z951 Presence of aortocoronary bypass graft: Secondary | ICD-10-CM

## 2023-10-05 LAB — GLUCOSE, CAPILLARY
Glucose-Capillary: 156 mg/dL — ABNORMAL HIGH (ref 70–99)
Glucose-Capillary: 93 mg/dL (ref 70–99)

## 2023-10-05 NOTE — Progress Notes (Signed)
 Daily Session Note  Patient Details  Name: Adam Frost MRN: [id number] Date of Birth: 04/12/1941 Referring Provider:   Flowsheet Row Cardiac Rehab from 09/28/2023 in [facility] Cardiac and Pulmonary Rehab  Referring Provider Percival Brace, MD       Encounter Date: 10/05/2023  Check In:  Session Check In - 10/05/23 0945       Check-In   Supervising physician immediately available to respond to emergencies See telemetry face sheet for immediately available ER MD    Location [facility]-Cardiac & Pulmonary Rehab    Staff Present Sherle Dire, BS, Exercise Physiologist;Esly Selvage RN,BSN,MPA;Maxon Conetta BS, Exercise Physiologist;Joseph Lacinda Pica RCP,RRT,BSRT    Virtual Visit No    Medication changes reported     No    Fall or balance concerns reported    No    Warm-up and Cool-down Performed on first and last piece of equipment    Resistance Training Performed Yes    VAD Patient? No    PAD/SET Patient? No      Pain Assessment   Currently in Pain? No/denies                Social History   Tobacco Use  Smoking Status Never  Smokeless Tobacco Never    Goals Met:  Independence with exercise equipment Exercise tolerated well No report of concerns or symptoms today Strength training completed today  Goals Unmet:  Not Applicable  Comments: First full day of exercise!  Patient was oriented to gym and equipment including functions, settings, policies, and procedures.  Patient's individual exercise prescription and treatment plan were reviewed.  All starting workloads were established based on the results of the 6 minute walk test done at initial orientation visit.  The plan for exercise progression was also introduced and progression will be customized based on patient's performance and goals.    Dr. Firman Hughes is Medical Director for [facility] Cardiac Rehabilitation.  Dr. Fuad Aleskerov is Medical Director for [facility] Pulmonary Rehabilitation.

## 2023-10-05 NOTE — Progress Notes (Signed)
 Cardiac Individual Treatment Plan  Patient Details  Name: Adam Frost MRN: [id number] Date of Birth: 1941-02-08 Referring Provider:   Flowsheet Row Cardiac Rehab from 09/28/2023 in [facility] Cardiac and Pulmonary Rehab  Referring Provider Percival Brace, MD       Initial Encounter Date:  Flowsheet Row Cardiac Rehab from 09/28/2023 in [facility] Cardiac and Pulmonary Rehab  Date 09/28/23       Visit Diagnosis: S/P CABG x 3  Patient's Home Medications on Admission:  Current Outpatient Medications:    aspirin  EC 325 MG tablet, Take 1 tablet (325 mg total) by mouth daily., Disp: , Rfl:    atorvastatin  (LIPITOR) 40 MG tablet, Take 1 tablet (40 mg total) by mouth daily., Disp: 90 tablet, Rfl: 4   glipiZIDE  (GLUCOTROL  XL) 5 MG 24 hr tablet, Take 5 mg by mouth daily with breakfast., Disp: , Rfl:    hydrochlorothiazide  (HYDRODIURIL ) 12.5 MG tablet, Take 12.5 mg by mouth daily., Disp: , Rfl:    metformin  (FORTAMET ) 1000 MG (OSM) 24 hr tablet, Take 1,000 mg by mouth daily with breakfast., Disp: , Rfl:    metoprolol  succinate (TOPROL -XL) 25 MG 24 hr tablet, Take 25 mg by mouth daily., Disp: , Rfl:    OVER THE COUNTER MEDICATION, Take 2 tablets by mouth daily. VitaFusion - Omega 3/EPA/DHEA/ALA, Disp: , Rfl:    traMADol  (ULTRAM ) 50 MG tablet, Take 1 tablet (50 mg total) by mouth every 6 (six) hours as needed for moderate pain (pain score 4-6). 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   Coronary artery disease    Diabetes mellitus without complication (HCC)    Hemorrhoid    Hyperlipidemia    Hypertension     Tobacco Use: Social History   Tobacco Use  Smoking Status Never  Smokeless Tobacco Never     Labs: Review Flowsheet  More data exists      Latest Ref Rng & Units 10/11/2017 04/13/2018 10/10/2018 08/05/2023 08/08/2023  Labs for ITP Cardiac and Pulmonary Rehab  Cholestrol 100 - 199 mg/dL 762  831  - - -  LDL (calc) 0 - 99 mg/dL - 517  - - -  HDL-C >61 mg/dL - 49  - - -  Trlycerides 0 - 149 mg/dL 63  72  - - -  Hemoglobin A1c 4.8 - 5.6 % 6.3  6.3  6.5  6.8  -  PH, Arterial 7.35 - 7.45 - - - - 7.354  7.399  7.390  7.399  7.427  7.302  7.385  7.46   PCO2 arterial 32 - 48 mmHg - - - - 35.9  35.6  36.0  39.1  36.5  43.1  30.1  25   Bicarbonate 20.0 - 28.0 mmol/L - - - - 20.4  22.4  22.1  24.1  24.0  35.2  21.3  18.0  17.8   TCO2 22 - 32 mmol/L - - - - 22  24  23  25  25  25  23   37  23  21  19  21    Acid-base deficit 0.0 - 2.0 mmol/L - - - - 5.0  3.0  3.0  1.0  5.0  6.0  4.4   O2 Saturation % - - - - 99  99  98  100  100  86  100  100  99.9     Details       Multiple values from one day are sorted in reverse-chronological order          Exercise Target Goals: Exercise Program Goal: Individual exercise prescription set using results from initial 6 min walk test and THRR while considering  patient's activity barriers and safety.   Exercise Prescription Goal: Initial exercise prescription builds to 30-45 minutes a day of aerobic activity, 2-3 days per week.  Home exercise guidelines will be given to patient during program as part of exercise prescription that the participant will acknowledge.   Education: Aerobic Exercise: - Group verbal and visual presentation on the components of exercise prescription. Introduces F.I.T.T principle from ACSM for exercise prescriptions.  Reviews F.I.T.T. principles of aerobic exercise including progression. Written material given at graduation.   Education: Resistance Exercise: - Group verbal and visual presentation on the components of exercise prescription. Introduces F.I.T.T principle from ACSM for exercise prescriptions  Reviews F.I.T.T.  principles of resistance exercise including progression. Written material given at graduation.    Education: Exercise & Equipment Safety: - Individual verbal instruction and demonstration of equipment use and safety with use of the equipment. Flowsheet Row Cardiac Rehab from 10/05/2023 in [facility] Cardiac and Pulmonary Rehab  Date 09/28/23  Educator MB  Instruction Review Code 1- Verbalizes Understanding       Education: Exercise Physiology & General Exercise Guidelines: - Group verbal and written instruction with models to review the exercise physiology of the cardiovascular system and associated critical values. Provides general exercise guidelines with specific guidelines to those with heart or lung disease.    Education: Flexibility, Balance, Mind/Body Relaxation: - Group verbal and visual presentation with interactive activity on the components of exercise prescription. Introduces F.I.T.T principle from ACSM for exercise prescriptions. Reviews F.I.T.T. principles of flexibility and balance exercise training including progression. Also discusses the mind body connection.  Reviews various relaxation techniques to help reduce and manage stress (i.e. Deep breathing, progressive muscle relaxation, and visualization). Balance handout provided to take home. Written material given at graduation.   Activity Barriers & Risk Stratification:  Activity Barriers & Cardiac Risk Stratification - 09/28/23 1045       Activity Barriers & Cardiac Risk Stratification   Activity Barriers Back Problems;Assistive Device;Other (comment)    Comments Lifting restriction of no more than 10 lbs until 11/07/2023. Wears a back brace and uses a cane in public    Cardiac Risk Stratification High             6 Minute Walk:  6 Minute Walk     Row Name 09/28/23 1044         6 Minute Walk   Phase Initial     Distance 1140 feet     Walk Time 6 minutes     # of Rest Breaks 0     MPH 2.16     METS 2.03      RPE 13     Perceived Dyspnea  0     VO2 Peak 7.09     Symptoms No     Resting HR 52 bpm     Resting BP 140/60     Resting Oxygen Saturation  98 %     Exercise Oxygen Saturation  during 6 min walk 97 %  Max Ex. HR 72 bpm     Max Ex. BP 144/60     2 Minute Post BP 100/60              Oxygen Initial Assessment:   Oxygen Re-Evaluation:   Oxygen Discharge (Final Oxygen Re-Evaluation):   Initial Exercise Prescription:  Initial Exercise Prescription - 09/28/23 1000       Date of Initial Exercise RX and Referring Provider   Date 09/28/23    Referring Provider Percival Brace, MD      Oxygen   Maintain Oxygen Saturation 88% or higher      Treadmill   MPH 1.4    Grade 0    Minutes 15    METs 2.07      Recumbant Bike   Level 2    RPM 50    Watts 25    Minutes 15    METs 2.03      NuStep   Level 2   T4 and T6   SPM 80    Minutes 15    METs 2.03      T5 Nustep   Level 2    SPM 80    Minutes 15    METs 2.03      Track   Laps 22    Minutes 15    METs 2.2      Prescription Details   Frequency (times per week) 3    Duration Progress to 30 minutes of continuous aerobic without signs/symptoms of physical distress      Intensity   THRR 40-80% of Max Heartrate 85-119    Ratings of Perceived Exertion 11-13    Perceived Dyspnea 0-4      Progression   Progression Continue to progress workloads to maintain intensity without signs/symptoms of physical distress.      Resistance Training   Training Prescription Yes    Weight 3 lb    Reps 10-15             Perform Capillary Blood Glucose checks as needed.  Exercise Prescription Changes:   Exercise Prescription Changes     Row Name 09/28/23 1000             Response to Exercise   Blood Pressure (Admit) 140/60       Blood Pressure (Exercise) 144/60       Blood Pressure (Exit) 100/60       Heart Rate (Admit) 52 bpm       Heart Rate (Exercise) 72 bpm       Heart Rate (Exit) 56 bpm        Oxygen Saturation (Admit) 98 %       Oxygen Saturation (Exercise) 97 %       Oxygen Saturation (Exit) 98 %       Rating of Perceived Exertion (Exercise) 13       Perceived Dyspnea (Exercise) 0       Symptoms none       Comments results       Intensity THRR New         Progression   Average METs 2.03                Exercise Comments:   Exercise Comments     Row Name 10/05/23 0946           Exercise Comments First full day of exercise!  Patient was oriented to gym and equipment including functions, settings, policies, and procedures.  Patient's individual exercise prescription and treatment plan were reviewed.  All starting workloads were established based on the results of the 6 minute walk test done at initial orientation visit.  The plan for exercise progression was also introduced and progression will be customized based on patient's performance and goals.                Exercise Goals and Review:   Exercise Goals     Row Name 09/28/23 1049             Exercise Goals   Increase Physical Activity Yes       Intervention Provide advice, education, support and counseling about physical activity/exercise needs.;Develop an individualized exercise prescription for aerobic and resistive training based on initial evaluation findings, risk stratification, comorbidities and participant's personal goals.       Expected Outcomes Short Term: Attend rehab on a regular basis to increase amount of physical activity.;Long Term: Add in home exercise to make exercise part of routine and to increase amount of physical activity.;Long Term: Exercising regularly at least 3-5 days a week.       Increase Strength and Stamina Yes       Intervention Provide advice, education, support and counseling about physical activity/exercise needs.;Develop an individualized exercise prescription for aerobic and resistive training based on initial evaluation findings, risk stratification,  comorbidities and participant's personal goals.       Expected Outcomes Short Term: Increase workloads from initial exercise prescription for resistance, speed, and METs.;Short Term: Perform resistance training exercises routinely during rehab and add in resistance training at home;Long Term: Improve cardiorespiratory fitness, muscular endurance and strength as measured by increased METs and functional capacity ( )       Able to understand and use rate of perceived exertion (RPE) scale Yes       Intervention Provide education and explanation on how to use RPE scale       Expected Outcomes Short Term: Able to use RPE daily in rehab to express subjective intensity level;Long Term:  Able to use RPE to guide intensity level when exercising independently       Able to understand and use Dyspnea scale Yes       Intervention Provide education and explanation on how to use Dyspnea scale       Expected Outcomes Short Term: Able to use Dyspnea scale daily in rehab to express subjective sense of shortness of breath during exertion;Long Term: Able to use Dyspnea scale to guide intensity level when exercising independently       Knowledge and understanding of Target Heart Rate Range (THRR) Yes       Intervention Provide education and explanation of THRR including how the numbers were predicted and where they are located for reference       Expected Outcomes Short Term: Able to state/look up THRR;Short Term: Able to use daily as guideline for intensity in rehab;Long Term: Able to use THRR to govern intensity when exercising independently       Able to check pulse independently Yes       Intervention Provide education and demonstration on how to check pulse in carotid and radial arteries.;Review the importance of being able to check your own pulse for safety during independent exercise       Expected Outcomes Short Term: Able to explain why pulse checking is important during independent exercise;Long Term: Able to  check pulse independently and accurately       Understanding of Exercise Prescription  Yes       Intervention Provide education, explanation, and written materials on patient's individual exercise prescription       Expected Outcomes Short Term: Able to explain program exercise prescription;Long Term: Able to explain home exercise prescription to exercise independently                Exercise Goals Re-Evaluation :   Discharge Exercise Prescription (Final Exercise Prescription Changes):  Exercise Prescription Changes - 09/28/23 1000       Response to Exercise   Blood Pressure (Admit) 140/60    Blood Pressure (Exercise) 144/60    Blood Pressure (Exit) 100/60    Heart Rate (Admit) 52 bpm    Heart Rate (Exercise) 72 bpm    Heart Rate (Exit) 56 bpm    Oxygen Saturation (Admit) 98 %    Oxygen Saturation (Exercise) 97 %    Oxygen Saturation (Exit) 98 %    Rating of Perceived Exertion (Exercise) 13    Perceived Dyspnea (Exercise) 0    Symptoms none    Comments results    Intensity THRR New      Progression   Average METs 2.03             Nutrition:  Target Goals: Understanding of nutrition guidelines, daily intake of sodium 1500mg , cholesterol 200mg , calories 30% from fat and 7% or less from saturated fats, daily to have 5 or more servings of fruits and vegetables.  Education: All About Nutrition: -Group instruction provided by verbal, written material, interactive activities, discussions, models, and posters to present general guidelines for heart healthy nutrition including fat, fiber, MyPlate, the role of sodium in heart healthy nutrition, utilization of the nutrition label, and utilization of this knowledge for meal planning. Follow up email sent as well. Written material given at graduation. Flowsheet Row Cardiac Rehab from 10/05/2023 in [facility] Cardiac and Pulmonary Rehab  Education need identified 09/28/23  Date 10/05/23  Educator jg part 1  Instruction Review Code  1- Verbalizes Understanding       Biometrics:  Pre Biometrics - 09/28/23 1050       Pre Biometrics   Height 5' 11.1" (1.806 m)    Weight 163 lb 14.4 oz (74.3 kg)    Waist Circumference 39.4 inches    Hip Circumference 38.5 inches    Waist to Hip Ratio 1.02 %    BMI (Calculated) 22.79    Single Leg Stand 4.3 seconds              Nutrition Therapy Plan and Nutrition Goals:  Nutrition Therapy & Goals - 09/28/23 1052       Personal Nutrition Goals   Nutrition Goal Wants to think about scheduling appointment with RD in the coming weeks      Intervention Plan   Intervention Prescribe, educate and counsel regarding individualized specific dietary modifications aiming towards targeted core components such as weight, hypertension, lipid management, diabetes, heart failure and other comorbidities.    Expected Outcomes Short Term Goal: Understand basic principles of dietary content, such as calories, fat, sodium, cholesterol and nutrients.             Nutrition Assessments:  MEDIFICTS Score Key: >=70 Need to make dietary changes  40-70 Heart Healthy Diet <= 40 Therapeutic Level Cholesterol Diet  Flowsheet Row Cardiac Rehab from 09/28/2023 in [facility] Cardiac and Pulmonary Rehab  Picture Your Plate Total Score on Admission 55      Picture Your Plate Scores: <16 Unhealthy dietary pattern  with much room for improvement. 41-50 Dietary pattern unlikely to meet recommendations for good health and room for improvement. 51-60 More healthful dietary pattern, with some room for improvement.  >60 Healthy dietary pattern, although there may be some specific behaviors that could be improved.    Nutrition Goals Re-Evaluation:   Nutrition Goals Discharge (Final Nutrition Goals Re-Evaluation):   Psychosocial: Target Goals: Acknowledge presence or absence of significant depression and/or stress, maximize coping skills, provide positive support system. Participant is able to  verbalize types and ability to use techniques and skills needed for reducing stress and depression.   Education: Stress, Anxiety, and Depression - Group verbal and visual presentation to define topics covered.  Reviews how body is impacted by stress, anxiety, and depression.  Also discusses healthy ways to reduce stress and to treat/manage anxiety and depression.  Written material given at graduation.   Education: Sleep Hygiene -Provides group verbal and written instruction about how sleep can affect your health.  Define sleep hygiene, discuss sleep cycles and impact of sleep habits. Review good sleep hygiene tips.    Initial Review & Psychosocial Screening:  Initial Psych Review & Screening - 09/22/23 1343       Initial Review   Current issues with None Identified      Family Dynamics   Good Support System? Yes   wife, has problems off and on, daughter close by     Barriers   Psychosocial barriers to participate in program There are no identifiable barriers or psychosocial needs.      Screening Interventions   Interventions To provide support and resources with identified psychosocial needs;Provide feedback about the scores to participant    Expected Outcomes Short Term goal: Utilizing psychosocial counselor, staff and physician to assist with identification of specific Stressors or current issues interfering with healing process. Setting desired goal for each stressor or current issue identified.;Long Term Goal: Stressors or current issues are controlled or eliminated.;Short Term goal: Identification and review with participant of any Quality of Life or Depression concerns found by scoring the questionnaire.;Long Term goal: The participant improves quality of Life and PHQ9 Scores as seen by post scores and/or verbalization of changes             Quality of Life Scores:   Quality of Life - 09/28/23 1051       Quality of Life   Select Quality of Life      Quality of Life  Scores   Health/Function Pre 22.77 %    Socioeconomic Pre 21.75 %    Psych/Spiritual Pre 23.57 %    Family Pre 22.5 %    GLOBAL Pre 22.66 %            Scores of 19 and below usually indicate a poorer quality of life in these areas.  A difference of  2-3 points is a clinically meaningful difference.  A difference of 2-3 points in the total score of the Quality of Life Index has been associated with significant improvement in overall quality of life, self-image, physical symptoms, and general health in studies assessing change in quality of life.  PHQ-9: Review Flowsheet  More data exists      09/28/2023 04/16/2019 04/13/2018 10/11/2017 03/17/2017  Depression screen PHQ 2/9  Decreased Interest 0 0 0 0 0  Down, Depressed, Hopeless 0 0 0 0 0  PHQ - 2 Score 0 0 0 0 0  Altered sleeping 1 - - - 0  Tired, decreased energy 1 - - -  0  Change in appetite 1 - - - 0  Feeling bad or failure about yourself  0 - - - 0  Trouble concentrating 0 - - - 0  Moving slowly or fidgety/restless 0 - - - 0  Suicidal thoughts 0 - - - 0  PHQ-9 Score 3 - - - 0  Difficult doing work/chores Not difficult at all - - - Not difficult at all   Interpretation of Total Score  Total Score Depression Severity:  1-4 = Minimal depression, 5-9 = Mild depression, 10-14 = Moderate depression, 15-19 = Moderately severe depression, 20-27 = Severe depression   Psychosocial Evaluation and Intervention:  Psychosocial Evaluation - 09/22/23 1352       Psychosocial Evaluation & Interventions   Interventions Encouraged to exercise with the program and follow exercise prescription    Comments There are no barriers to attending the program. He live with hs wife; she and his daughter are his support. He said hi wife is off and on with her health .   he is hoping to gain back the 10 pounds he lost in the surgery/hospital process. He has heard about the program and is attendng with the recommendation of family and his Careers adviser.     Expected Outcomes STG attend all scheduled sessions, work on exercise progression. LTG able to continue with exercise progression after discharge    Continue Psychosocial Services  Follow up required by staff             Psychosocial Re-Evaluation:   Psychosocial Discharge (Final Psychosocial Re-Evaluation):   Vocational Rehabilitation: Provide vocational rehab assistance to qualifying candidates.   Vocational Rehab Evaluation & Intervention:   Education: Education Goals: Education classes will be provided on a variety of topics geared toward better understanding of heart health and risk factor modification. Participant will state understanding/return demonstration of topics presented as noted by education test scores.  Learning Barriers/Preferences:   General Cardiac Education Topics:  AED/CPR: - Group verbal and written instruction with the use of models to demonstrate the basic use of the AED with the basic ABC's of resuscitation.   Anatomy and Cardiac Procedures: - Group verbal and visual presentation and models provide information about basic cardiac anatomy and function. Reviews the testing methods done to diagnose heart disease and the outcomes of the test results. Describes the treatment choices: Medical Management, Angioplasty, or Coronary Bypass Surgery for treating various heart conditions including Myocardial Infarction, Angina, Valve Disease, and Cardiac Arrhythmias.  Written material given at graduation.   Medication Safety: - Group verbal and visual instruction to review commonly prescribed medications for heart and lung disease. Reviews the medication, class of the drug, and side effects. Includes the steps to properly store meds and maintain the prescription regimen.  Written material given at graduation.   Intimacy: - Group verbal instruction through game format to discuss how heart and lung disease can affect sexual intimacy. Written material given at  graduation..   Know Your Numbers and Heart Failure: - Group verbal and visual instruction to discuss disease risk factors for cardiac and pulmonary disease and treatment options.  Reviews associated critical values for Overweight/Obesity, Hypertension, Cholesterol, and Diabetes.  Discusses basics of heart failure: signs/symptoms and treatments.  Introduces Heart Failure Zone chart for action plan for heart failure.  Written material given at graduation.   Infection Prevention: - Provides verbal and written material to individual with discussion of infection control including proper hand washing and proper equipment cleaning during exercise session. Flowsheet Row  Cardiac Rehab from 10/05/2023 in [facility] Cardiac and Pulmonary Rehab  Date 09/28/23  Educator MB  Instruction Review Code 1- Verbalizes Understanding       Falls Prevention: - Provides verbal and written material to individual with discussion of falls prevention and safety. Flowsheet Row Cardiac Rehab from 10/05/2023 in [facility] Cardiac and Pulmonary Rehab  Date 09/28/23  Educator MB  Instruction Review Code 1- Verbalizes Understanding       Other: -Provides group and verbal instruction on various topics (see comments)   Knowledge Questionnaire Score:  Knowledge Questionnaire Score - 09/28/23 1053       Knowledge Questionnaire Score   Pre Score 23/26             Core Components/Risk Factors/Patient Goals at Admission:  Personal Goals and Risk Factors at Admission - 09/28/23 1053       Core Components/Risk Factors/Patient Goals on Admission    Weight Management Yes;Weight Gain    Intervention Weight Management: Develop a combined nutrition and exercise program designed to reach desired caloric intake, while maintaining appropriate intake of nutrient and fiber, sodium and fats, and appropriate energy expenditure required for the weight goal.;Weight Management: Provide education and appropriate resources to help  participant work on and attain dietary goals.;Weight Management/Obesity: Establish reasonable short term and long term weight goals.    Admit Weight 163 lb 14.4 oz (74.3 kg)    Goal Weight: Short Term 165 lb (74.8 kg)    Goal Weight: Long Term 170 lb (77.1 kg)    Expected Outcomes Short Term: Continue to assess and modify interventions until short term weight is achieved;Long Term: Adherence to nutrition and physical activity/exercise program aimed toward attainment of established weight goal;Understanding of distribution of calorie intake throughout the day with the consumption of 4-5 meals/snacks;Weight Gain: Understanding of general recommendations for a high calorie, high protein meal plan that promotes weight gain by distributing calorie intake throughout the day with the consumption for 4-5 meals, snacks, and/or supplements;Understanding recommendations for meals to include 15-35% energy as protein, 25-35% energy from fat, 35-60% energy from carbohydrates, less than 200mg  of dietary cholesterol, 20-35 gm of total fiber daily    Diabetes Yes    Intervention Provide education about signs/symptoms and action to take for hypo/hyperglycemia.;Provide education about proper nutrition, including hydration, and aerobic/resistive exercise prescription along with prescribed medications to achieve blood glucose in normal ranges: Fasting glucose 65-99 mg/dL    Expected Outcomes Short Term: Participant verbalizes understanding of the signs/symptoms and immediate care of hyper/hypoglycemia, proper foot care and importance of medication, aerobic/resistive exercise and nutrition plan for blood glucose control.;Long Term: Attainment of HbA1C < 7%.    Hypertension Yes    Intervention Provide education on lifestyle modifcations including regular physical activity/exercise, weight management, moderate sodium restriction and increased consumption of fresh fruit, vegetables, and low fat dairy, alcohol moderation, and  smoking cessation.;Monitor prescription use compliance.    Expected Outcomes Short Term: Continued assessment and intervention until BP is < 140/32mm HG in hypertensive participants. < 130/47mm HG in hypertensive participants with diabetes, heart failure or chronic kidney disease.;Long Term: Maintenance of blood pressure at goal levels.    Lipids Yes    Intervention Provide education and support for participant on nutrition & aerobic/resistive exercise along with prescribed medications to achieve LDL 70mg , HDL >40mg .    Expected Outcomes Short Term: Participant states understanding of desired cholesterol values and is compliant with medications prescribed. Participant is following exercise prescription and nutrition guidelines.;Long Term: Cholesterol controlled  with medications as prescribed, with individualized exercise RX and with personalized nutrition plan. Value goals: LDL < 70mg , HDL > 40 mg.             Education:Diabetes - Individual verbal and written instruction to review signs/symptoms of diabetes, desired ranges of glucose level fasting, after meals and with exercise. Acknowledge that pre and post exercise glucose checks will be done for 3 sessions at entry of program. Flowsheet Row Cardiac Rehab from 10/05/2023 in [facility] Cardiac and Pulmonary Rehab  Date 09/28/23  Educator MB  Instruction Review Code 1- Verbalizes Understanding       Core Components/Risk Factors/Patient Goals Review:    Core Components/Risk Factors/Patient Goals at Discharge (Final Review):    ITP Comments:  ITP Comments     Row Name 09/22/23 1356 09/28/23 1044 10/05/23 0946 10/05/23 0950     ITP Comments Virtual orientation call completed today. he has an appointment on Date: 09/28/2023  for EP eval and gym Orientation.  Documentation of diagnosis can be found in [facility] 08/08/2023 . Completed and gym orientation for cardiac rehab. Initial ITP created and sent for review to Dr. Firman Hughes, Medical  Director. First full day of exercise!  Patient was oriented to gym and equipment including functions, settings, policies, and procedures.  Patient's individual exercise prescription and treatment plan were reviewed.  All starting workloads were established based on the results of the 6 minute walk test done at initial orientation visit.  The plan for exercise progression was also introduced and progression will be customized based on patient's performance and goals. 30 Day review completed. Medical Director ITP review done, changes made as directed, and signed approval by Medical Director.    new to program             Comments:

## 2023-10-06 ENCOUNTER — Encounter: Admitting: *Deleted

## 2023-10-06 DIAGNOSIS — Z951 Presence of aortocoronary bypass graft: Secondary | ICD-10-CM

## 2023-10-06 DIAGNOSIS — Z48812 Encounter for surgical aftercare following surgery on the circulatory system: Secondary | ICD-10-CM | POA: Diagnosis not present

## 2023-10-06 LAB — GLUCOSE, CAPILLARY
Glucose-Capillary: 198 mg/dL — ABNORMAL HIGH (ref 70–99)
Glucose-Capillary: 88 mg/dL (ref 70–99)

## 2023-10-06 NOTE — Progress Notes (Signed)
 Daily Session Note  Patient Details  Name: Adam Frost MRN: [id number] Date of Birth: [birth year] Referring Provider:   Flowsheet Row Cardiac Rehab from 09/28/2023 in [facility] Cardiac and Pulmonary Rehab  Referring Provider Percival Brace, MD       Encounter Date: 10/06/2023  Check In:  Session Check In - 10/06/23 1023       Check-In   Supervising physician immediately available to respond to emergencies See telemetry face sheet for immediately available ER MD    Location [facility]-Cardiac & Pulmonary Rehab    Staff Present Lyell Samuel, MS, Exercise Physiologist;Ekaterini Capitano, RN, BSN, CCRP;Maxon Conetta BS, Exercise Physiologist;Joseph Hood RCP,RRT,BSRT    Virtual Visit No    Medication changes reported     No    Fall or balance concerns reported    No    Warm-up and Cool-down Performed on first and last piece of equipment    Resistance Training Performed Yes    VAD Patient? No    PAD/SET Patient? No      Pain Assessment   Currently in Pain? No/denies                Social History   Tobacco Use  Smoking Status Never  Smokeless Tobacco Never    Goals Met:  Independence with exercise equipment Exercise tolerated well No report of concerns or symptoms today  Goals Unmet:  Not Applicable  Comments: Pt able to follow exercise prescription today without complaint.  Will continue to monitor for progression.    Dr. Firman Hughes is Medical Director for [facility] Cardiac Rehabilitation.  Dr. Fuad Aleskerov is Medical Director for [facility] Pulmonary Rehabilitation.

## 2023-10-10 ENCOUNTER — Encounter: Admitting: *Deleted

## 2023-10-10 DIAGNOSIS — Z951 Presence of aortocoronary bypass graft: Secondary | ICD-10-CM

## 2023-10-10 DIAGNOSIS — Z48812 Encounter for surgical aftercare following surgery on the circulatory system: Secondary | ICD-10-CM | POA: Diagnosis not present

## 2023-10-10 LAB — GLUCOSE, CAPILLARY
Glucose-Capillary: 151 mg/dL — ABNORMAL HIGH (ref 70–99)
Glucose-Capillary: 140 mg/dL — ABNORMAL HIGH (ref 70–99)

## 2023-10-10 NOTE — Progress Notes (Signed)
 Daily Session Note  Patient Details  Name: BRONCO MCGRORY MRN: [id number] Date of Birth: 03/01/1941 Referring Provider:   Flowsheet Row Cardiac Rehab from 09/28/2023 in [facility] Cardiac and Pulmonary Rehab  Referring Provider Percival Brace, MD       Encounter Date: 10/10/2023  Check In:  Session Check In - 10/10/23 1121       Check-In   Supervising physician immediately available to respond to emergencies See telemetry face sheet for immediately available ER MD    Location [facility]-Cardiac & Pulmonary Rehab    Staff Present Maud Sorenson, RN, BSN, CCRP;Meredith Manson Seitz RN,BSN;Maxon [place] BS, Exercise Physiologist;Kelly Bollinger [facility]    Virtual Visit No    Medication changes reported     No    Fall or balance concerns reported    No    Warm-up and Cool-down Performed on first and last piece of equipment    Resistance Training Performed Yes    VAD Patient? No    PAD/SET Patient? No      Pain Assessment   Currently in Pain? No/denies                Social History   Tobacco Use  Smoking Status Never  Smokeless Tobacco Never    Goals Met:  Independence with exercise equipment Exercise tolerated well No report of concerns or symptoms today  Goals Unmet:  Not Applicable  Comments: Pt able to follow exercise prescription today without complaint.  Will continue to monitor for progression.    Dr. Firman Hughes is Medical Director for [facility] Cardiac Rehabilitation.  Dr. Fuad Aleskerov is Medical Director for [facility] Pulmonary Rehabilitation.

## 2023-10-12 ENCOUNTER — Encounter: Admitting: *Deleted

## 2023-10-12 DIAGNOSIS — Z48812 Encounter for surgical aftercare following surgery on the circulatory system: Secondary | ICD-10-CM | POA: Diagnosis not present

## 2023-10-12 DIAGNOSIS — Z951 Presence of aortocoronary bypass graft: Secondary | ICD-10-CM

## 2023-10-12 NOTE — Progress Notes (Signed)
 Daily Session Note  Patient Details  Name: Adam Frost MRN: [id number] Date of Birth: October 15, 1940 Referring Provider:   Flowsheet Row Cardiac Rehab from 09/28/2023 in [facility] Cardiac and Pulmonary Rehab  Referring Provider Percival Brace, MD       Encounter Date: 10/12/2023  Check In:  Session Check In - 10/12/23 0940       Check-In   Supervising physician immediately available to respond to emergencies See telemetry face sheet for immediately available ER MD    Location [facility]-Cardiac & Pulmonary Rehab    Staff Present Maud Sorenson, RN, BSN, CCRP;Meredith Manson Seitz RN,BSN;Noah Tickle, BS, Exercise Physiologist;Kelly Bollinger [facility]    Virtual Visit No    Medication changes reported     No    Fall or balance concerns reported    No    Warm-up and Cool-down Performed on first and last piece of equipment    Resistance Training Performed Yes    VAD Patient? No    PAD/SET Patient? No      Pain Assessment   Currently in Pain? No/denies                Social History   Tobacco Use  Smoking Status Never  Smokeless Tobacco Never    Goals Met:  Independence with exercise equipment Exercise tolerated well No report of concerns or symptoms today  Goals Unmet:  Not Applicable  Comments: Pt able to follow exercise prescription today without complaint.  Will continue to monitor for progression.    Dr. Firman Hughes is Medical Director for [facility] Cardiac Rehabilitation.  Dr. Fuad Aleskerov is Medical Director for [facility] Pulmonary Rehabilitation.

## 2023-10-13 ENCOUNTER — Encounter: Admitting: *Deleted

## 2023-10-13 DIAGNOSIS — Z48812 Encounter for surgical aftercare following surgery on the circulatory system: Secondary | ICD-10-CM | POA: Diagnosis not present

## 2023-10-13 DIAGNOSIS — Z951 Presence of aortocoronary bypass graft: Secondary | ICD-10-CM

## 2023-10-13 NOTE — Progress Notes (Signed)
 Daily Session Note  Patient Details  Name: Adam Frost MRN: [id number] Date of Birth: [birth year] Referring Provider:   Flowsheet Row Cardiac Rehab from 09/28/2023 in [facility] Cardiac and Pulmonary Rehab  Referring Provider Percival Brace, MD    Encounter Date: 10/13/2023  Check In:  Session Check In - 10/13/23 0948       Check-In   Supervising physician immediately available to respond to emergencies See telemetry face sheet for immediately available ER MD    Location [facility]-Cardiac & Pulmonary Rehab    Staff Present Maud Sorenson, RN, BSN, CCRP;Noah Tickle, BS, Exercise Physiologist;Meredith Manson Seitz RN,BSN;Jason Martina Sledge RDN,LDN    Virtual Visit No    Medication changes reported     No    Fall or balance concerns reported    No    Warm-up and Cool-down Performed on first and last piece of equipment    Resistance Training Performed Yes    VAD Patient? No    PAD/SET Patient? No      Pain Assessment   Currently in Pain? No/denies             Social History   Tobacco Use  Smoking Status Never  Smokeless Tobacco Never    Goals Met:  Independence with exercise equipment Exercise tolerated well No report of concerns or symptoms today  Goals Unmet:  Not Applicable  Comments: Pt able to follow exercise prescription today without complaint.  Will continue to monitor for progression.    Dr. Firman Hughes is Medical Director for [facility] Cardiac Rehabilitation.  Dr. Fuad Aleskerov is Medical Director for [facility] Pulmonary Rehabilitation.

## 2023-10-17 ENCOUNTER — Encounter: Admitting: *Deleted

## 2023-10-17 DIAGNOSIS — Z48812 Encounter for surgical aftercare following surgery on the circulatory system: Secondary | ICD-10-CM | POA: Diagnosis not present

## 2023-10-17 DIAGNOSIS — Z951 Presence of aortocoronary bypass graft: Secondary | ICD-10-CM

## 2023-10-17 NOTE — Progress Notes (Signed)
 Daily Session Note  Patient Details  Name: Adam Frost MRN: [id number] Date of Birth: 1940-06-11 Referring Provider:   Flowsheet Row Cardiac Rehab from 09/28/2023 in [facility] Cardiac and Pulmonary Rehab  Referring Provider Percival Brace, MD    Encounter Date: 10/17/2023  Check In:  Session Check In - 10/17/23 0939       Check-In   Supervising physician immediately available to respond to emergencies See telemetry face sheet for immediately available ER MD    Location [facility]-Cardiac & Pulmonary Rehab    Staff Present Maud Sorenson, RN, BSN, CCRP;Joseph Hood RCP,RRT,BSRT;Maxon [place] BS, Exercise Physiologist;Kelly [employer], ACSM CEP, Exercise Physiologist    Virtual Visit No    Medication changes reported     No    Fall or balance concerns reported    No    Warm-up and Cool-down Performed on first and last piece of equipment    Resistance Training Performed Yes    VAD Patient? No    PAD/SET Patient? No      Pain Assessment   Currently in Pain? No/denies             Social History   Tobacco Use  Smoking Status Never  Smokeless Tobacco Never    Goals Met:  Independence with exercise equipment Exercise tolerated well No report of concerns or symptoms today  Goals Unmet:  Not Applicable  Comments: Pt able to follow exercise prescription today without complaint.  Will continue to monitor for progression.    Dr. Firman Hughes is Medical Director for [facility] Cardiac Rehabilitation.  Dr. Fuad Aleskerov is Medical Director for [facility] Pulmonary Rehabilitation.

## 2023-10-19 ENCOUNTER — Encounter: Admitting: *Deleted

## 2023-10-19 DIAGNOSIS — Z48812 Encounter for surgical aftercare following surgery on the circulatory system: Secondary | ICD-10-CM | POA: Diagnosis not present

## 2023-10-19 DIAGNOSIS — Z951 Presence of aortocoronary bypass graft: Secondary | ICD-10-CM

## 2023-10-19 NOTE — Progress Notes (Signed)
 Daily Session Note  Patient Details  Name: Adam Frost MRN: [id number] Date of Birth: 07-03-1940 Referring Provider:   Flowsheet Row Cardiac Rehab from 09/28/2023 in [facility] Cardiac and Pulmonary Rehab  Referring Provider Percival Brace, MD    Encounter Date: 10/19/2023  Check In:  Session Check In - 10/19/23 0947       Check-In   Supervising physician immediately available to respond to emergencies See telemetry face sheet for immediately available ER MD    Location [facility]-Cardiac & Pulmonary Rehab    Staff Present Maud Sorenson, RN, BSN, CCRP;Joseph Hood RCP,RRT,BSRT;Maxon [place] BS, Exercise Physiologist;Jason Martina Sledge RDN,LDN    Virtual Visit No    Medication changes reported     No    Fall or balance concerns reported    No    Warm-up and Cool-down Performed on first and last piece of equipment    Resistance Training Performed Yes    VAD Patient? No    PAD/SET Patient? No      Pain Assessment   Currently in Pain? No/denies             Social History   Tobacco Use  Smoking Status Never  Smokeless Tobacco Never    Goals Met:  Independence with exercise equipment Exercise tolerated well No report of concerns or symptoms today  Goals Unmet:  Not Applicable  Comments: Pt able to follow exercise prescription today without complaint.  Will continue to monitor for progression.    Dr. Firman Hughes is Medical Director for [facility] Cardiac Rehabilitation.  Dr. Fuad Aleskerov is Medical Director for [facility] Pulmonary Rehabilitation.

## 2023-10-20 ENCOUNTER — Encounter: Admitting: *Deleted

## 2023-10-20 DIAGNOSIS — Z48812 Encounter for surgical aftercare following surgery on the circulatory system: Secondary | ICD-10-CM | POA: Diagnosis not present

## 2023-10-20 DIAGNOSIS — Z951 Presence of aortocoronary bypass graft: Secondary | ICD-10-CM

## 2023-10-20 NOTE — Progress Notes (Signed)
 Daily Session Note  Patient Details  Name: Adam Frost MRN: [id number] Date of Birth: December 02, 1940 Referring Provider:   Flowsheet Row Cardiac Rehab from 09/28/2023 in [facility] Cardiac and Pulmonary Rehab  Referring Provider Percival Brace, MD    Encounter Date: 10/20/2023  Check In:  Session Check In - 10/20/23 0957       Check-In   Supervising physician immediately available to respond to emergencies See telemetry face sheet for immediately available ER MD    Location [facility]-Cardiac & Pulmonary Rehab    Staff Present Maud Sorenson, RN, BSN, CCRP;Joseph Hood RCP,RRT,BSRT;Maxon [place] BS, Exercise Physiologist;Krista Spencer RN,BSN    Virtual Visit No    Medication changes reported     No    Fall or balance concerns reported    No    Warm-up and Cool-down Performed on first and last piece of equipment    Resistance Training Performed Yes    VAD Patient? No    PAD/SET Patient? No      Pain Assessment   Currently in Pain? No/denies             Social History   Tobacco Use  Smoking Status Never  Smokeless Tobacco Never    Goals Met:  Independence with exercise equipment Exercise tolerated well No report of concerns or symptoms today  Goals Unmet:  Not Applicable  Comments: Pt able to follow exercise prescription today without complaint.  Will continue to monitor for progression.    Dr. Firman Hughes is Medical Director for [facility] Cardiac Rehabilitation.  Dr. Fuad Aleskerov is Medical Director for [facility] Pulmonary Rehabilitation.

## 2023-10-24 ENCOUNTER — Encounter: Admitting: *Deleted

## 2023-10-24 DIAGNOSIS — Z951 Presence of aortocoronary bypass graft: Secondary | ICD-10-CM

## 2023-10-24 DIAGNOSIS — Z48812 Encounter for surgical aftercare following surgery on the circulatory system: Secondary | ICD-10-CM | POA: Diagnosis not present

## 2023-10-24 NOTE — Progress Notes (Signed)
 Daily Session Note  Patient Details  Name: Adam Frost MRN: [id number] Date of Birth: Jun 17, 1940 Referring Provider:   Flowsheet Row Cardiac Rehab from 09/28/2023 in [facility] Cardiac and Pulmonary Rehab  Referring Provider Ammon Blunt, MD    Encounter Date: 10/24/2023  Check In:  Session Check In - 10/24/23 0936       Check-In   Supervising physician immediately available to respond to emergencies See telemetry face sheet for immediately available ER MD    Location [facility]-Cardiac & Pulmonary Rehab    Staff Present Othel Durand, RN, BSN, CCRP;Kelly Dyane BS, ACSM CEP, Exercise Physiologist;Kelly Bollinger [facility]    Virtual Visit No    Medication changes reported     No    Fall or balance concerns reported    No    Warm-up and Cool-down Performed on first and last piece of equipment    Resistance Training Performed Yes    VAD Patient? No    PAD/SET Patient? No      Pain Assessment   Currently in Pain? No/denies             Social History   Tobacco Use  Smoking Status Never  Smokeless Tobacco Never    Goals Met:  Independence with exercise equipment Exercise tolerated well No report of concerns or symptoms today  Goals Unmet:  Not Applicable  Comments: Pt able to follow exercise prescription today without complaint.  Will continue to monitor for progression.    Dr. Oneil Pinal is Medical Director for [facility] Cardiac Rehabilitation.  Dr. Fuad Aleskerov is Medical Director for [facility] Pulmonary Rehabilitation.

## 2023-10-26 ENCOUNTER — Encounter

## 2023-10-26 DIAGNOSIS — Z48812 Encounter for surgical aftercare following surgery on the circulatory system: Secondary | ICD-10-CM | POA: Diagnosis not present

## 2023-10-26 DIAGNOSIS — Z951 Presence of aortocoronary bypass graft: Secondary | ICD-10-CM

## 2023-10-26 NOTE — Progress Notes (Signed)
 Daily Session Note  Patient Details  Name: Adam Frost MRN: [id number] Date of Birth: 1941/01/25 Referring Provider:   Flowsheet Row Cardiac Rehab from 09/28/2023 in [facility] Cardiac and Pulmonary Rehab  Referring Provider Ammon Blunt, MD    Encounter Date: 10/26/2023  Check In:  Session Check In - 10/26/23 0924       Check-In   Supervising physician immediately available to respond to emergencies See telemetry face sheet for immediately available ER MD    Location [facility]-Cardiac & Pulmonary Rehab    Staff Present Rollene Paterson, MS, Exercise Physiologist;Jason Elnor RDN,LDN;Ramonia Mcclaran RN,BSN,MPA;Joseph Rolinda RCP,RRT,BSRT    Virtual Visit No    Medication changes reported     No    Fall or balance concerns reported    No    Warm-up and Cool-down Performed on first and last piece of equipment    Resistance Training Performed Yes    VAD Patient? No    PAD/SET Patient? No      Pain Assessment   Currently in Pain? No/denies             Social History   Tobacco Use  Smoking Status Never  Smokeless Tobacco Never    Goals Met:  Independence with exercise equipment Exercise tolerated well No report of concerns or symptoms today Strength training completed today  Goals Unmet:  Not Applicable  Comments: Pt able to follow exercise prescription today without complaint.  Will continue to monitor for progression.    Dr. Oneil Pinal is Medical Director for [facility] Cardiac Rehabilitation.  Dr. Fuad Aleskerov is Medical Director for [facility] Pulmonary Rehabilitation.

## 2023-10-27 ENCOUNTER — Encounter: Admitting: *Deleted

## 2023-10-27 DIAGNOSIS — Z951 Presence of aortocoronary bypass graft: Secondary | ICD-10-CM

## 2023-10-27 DIAGNOSIS — Z48812 Encounter for surgical aftercare following surgery on the circulatory system: Secondary | ICD-10-CM | POA: Diagnosis not present

## 2023-10-27 NOTE — Progress Notes (Signed)
 Daily Session Note  Patient Details  Name: Adam Frost MRN: [id number] Date of Birth: February 02, 1941 Referring Provider:   Flowsheet Row Cardiac Rehab from 09/28/2023 in [facility] Cardiac and Pulmonary Rehab  Referring Provider Ammon Blunt, MD    Encounter Date: 10/27/2023  Check In:  Session Check In - 10/27/23 1132       Check-In   Supervising physician immediately available to respond to emergencies See telemetry face sheet for immediately available ER MD    Location [facility]-Cardiac & Pulmonary Rehab    Staff Present Othel Durand, RN, BSN, CCRP;Meredith Tressa RN,BSN;Joseph Hood RCP,RRT,BSRT;Noah Tickle, [place], Exercise Physiologist    Virtual Visit No    Medication changes reported     No    Fall or balance concerns reported    No    Warm-up and Cool-down Performed on first and last piece of equipment    Resistance Training Performed Yes    VAD Patient? No    PAD/SET Patient? No      Pain Assessment   Currently in Pain? No/denies             Social History   Tobacco Use  Smoking Status Never  Smokeless Tobacco Never    Goals Met:  Independence with exercise equipment Exercise tolerated well No report of concerns or symptoms today  Goals Unmet:  Not Applicable  Comments: Pt able to follow exercise prescription today without complaint.  Will continue to monitor for progression.    Dr. Oneil Pinal is Medical Director for [facility] Cardiac Rehabilitation.  Dr. Fuad Aleskerov is Medical Director for [facility] Pulmonary Rehabilitation.

## 2023-10-31 ENCOUNTER — Encounter

## 2023-10-31 DIAGNOSIS — Z951 Presence of aortocoronary bypass graft: Secondary | ICD-10-CM

## 2023-10-31 DIAGNOSIS — Z48812 Encounter for surgical aftercare following surgery on the circulatory system: Secondary | ICD-10-CM | POA: Diagnosis not present

## 2023-10-31 NOTE — Progress Notes (Signed)
 Daily Session Note  Patient Details  Name: Adam Frost MRN: [id number] Date of Birth: [birth year] Referring Provider:   Flowsheet Row Cardiac Rehab from 09/28/2023 in [facility] Cardiac and Pulmonary Rehab  Referring Provider Ammon Blunt, MD    Encounter Date: 10/31/2023  Check In:  Session Check In - 10/31/23 0906       Check-In   Supervising physician immediately available to respond to emergencies See telemetry face sheet for immediately available ER MD    Location [facility]-Cardiac & Pulmonary Rehab    Staff Present Burnard Davenport RN,BSN,MPA;Maxon Conetta BS, Exercise Physiologist;Joseph Rolinda RCP,RRT,BSRT    Virtual Visit No    Medication changes reported     No    Fall or balance concerns reported    No    Warm-up and Cool-down Performed on first and last piece of equipment    Resistance Training Performed Yes    VAD Patient? No    PAD/SET Patient? No      Pain Assessment   Currently in Pain? No/denies             Social History   Tobacco Use  Smoking Status Never  Smokeless Tobacco Never    Goals Met:  Independence with exercise equipment Exercise tolerated well No report of concerns or symptoms today Strength training completed today  Goals Unmet:  Not Applicable  Comments: Pt able to follow exercise prescription today without complaint.  Will continue to monitor for progression.    Dr. Oneil Pinal is Medical Director for [facility] Cardiac Rehabilitation.  Dr. Fuad Aleskerov is Medical Director for [facility] Pulmonary Rehabilitation.

## 2023-11-02 ENCOUNTER — Encounter: Attending: Family Medicine

## 2023-11-02 DIAGNOSIS — Z48812 Encounter for surgical aftercare following surgery on the circulatory system: Secondary | ICD-10-CM | POA: Diagnosis present

## 2023-11-02 DIAGNOSIS — Z951 Presence of aortocoronary bypass graft: Secondary | ICD-10-CM | POA: Insufficient documentation

## 2023-11-02 NOTE — Progress Notes (Signed)
 Cardiac Individual Treatment Plan  Patient Details  Name: Adam Frost MRN: [id number] Date of Birth: [birth year] Referring Provider:   Flowsheet Row Cardiac Rehab from 09/28/2023 in [facility] Cardiac and Pulmonary Rehab  Referring Provider Ammon Blunt, MD    Initial Encounter Date:  Flowsheet Row Cardiac Rehab from 09/28/2023 in [facility] Cardiac and Pulmonary Rehab  Date 09/28/23    Visit Diagnosis: S/P CABG x 3  Patient's Home Medications on Admission:  Current Outpatient Medications:    aspirin  EC 325 MG tablet, Take 1 tablet (325 mg total) by mouth daily., Disp: , Rfl:    atorvastatin  (LIPITOR) 40 MG tablet, Take 1 tablet (40 mg total) by mouth daily., Disp: 90 tablet, Rfl: 4   glipiZIDE  (GLUCOTROL  XL) 5 MG 24 hr tablet, Take 5 mg by mouth daily with breakfast., Disp: , Rfl:    hydrochlorothiazide  (HYDRODIURIL ) 12.5 MG tablet, Take 12.5 mg by mouth daily., Disp: , Rfl:    metformin  (FORTAMET ) 1000 MG (OSM) 24 hr tablet, Take 1,000 mg by mouth daily with breakfast., Disp: , Rfl:    metoprolol  succinate (TOPROL -XL) 25 MG 24 hr tablet, Take 25 mg by mouth daily., Disp: , Rfl:    OVER THE COUNTER MEDICATION, Take 2 tablets by mouth daily. VitaFusion - Omega 3/EPA/DHEA/ALA, Disp: , Rfl:    traMADol  (ULTRAM ) 50 MG tablet, Take 1 tablet (50 mg total) by mouth every 6 (six) hours as needed for moderate pain (pain score 4-6). 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   Coronary artery disease    Diabetes mellitus without complication (HCC)    Hemorrhoid    Hyperlipidemia    Hypertension     Tobacco Use: Social History   Tobacco Use  Smoking Status Never  Smokeless Tobacco Never     Labs: Review Flowsheet  More data exists      Latest Ref Rng & Units 10/11/2017 04/13/2018 10/10/2018 08/05/2023 08/08/2023  Labs for ITP Cardiac and Pulmonary Rehab  Cholestrol 100 - 199 mg/dL 844  835  - - -  LDL (calc) 0 - 99 mg/dL - 898  - - -  HDL-C >60 mg/dL - 49  - - -  Trlycerides 0 - 149 mg/dL 63  72  - - -  Hemoglobin A1c 4.8 - 5.6 % 6.3  6.3  6.5  6.8  -  PH, Arterial 7.35 - 7.45 - - - - 7.354  7.399  7.390  7.399  7.427  7.302  7.385  7.46   PCO2 arterial 32 - 48 mmHg - - - - 35.9  35.6  36.0  39.1  36.5  43.1  30.1  25   Bicarbonate 20.0 - 28.0 mmol/L - - - - 20.4  22.4  22.1  24.1  24.0  35.2  21.3  18.0  17.8   TCO2 22 - 32 mmol/L - - - - 22  24  23  25  25  25  23   37  23  21  19  21    Acid-base deficit 0.0 - 2.0 mmol/L - - - - 5.0  3.0  3.0  1.0  5.0  6.0  4.4  O2 Saturation % - - - - 99  99  98  100  100  86  100  100  99.9     Details       Multiple values from one day are sorted in reverse-chronological order          Exercise Target Goals: Exercise Program Goal: Individual exercise prescription set using results from initial 6 min walk test and THRR while considering  patient's activity barriers and safety.   Exercise Prescription Goal: Initial exercise prescription builds to 30-45 minutes a day of aerobic activity, 2-3 days per week.  Home exercise guidelines will be given to patient during program as part of exercise prescription that the participant will acknowledge.   Education: Aerobic Exercise: - Group verbal and visual presentation on the components of exercise prescription. Introduces F.I.T.T principle from ACSM for exercise prescriptions.  Reviews F.I.T.T. principles of aerobic exercise including progression. Written material given at graduation.   Education: Resistance Exercise: - Group verbal and visual presentation on the components of exercise prescription. Introduces F.I.T.T principle from ACSM for exercise prescriptions  Reviews F.I.T.T.  principles of resistance exercise including progression. Written material given at graduation.    Education: Exercise & Equipment Safety: - Individual verbal instruction and demonstration of equipment use and safety with use of the equipment. Flowsheet Row Cardiac Rehab from 11/02/2023 in [facility] Cardiac and Pulmonary Rehab  Date 09/28/23  Educator MB  Instruction Review Code 1- Verbalizes Understanding    Education: Exercise Physiology & General Exercise Guidelines: - Group verbal and written instruction with models to review the exercise physiology of the cardiovascular system and associated critical values. Provides general exercise guidelines with specific guidelines to those with heart or lung disease.    Education: Flexibility, Balance, Mind/Body Relaxation: - Group verbal and visual presentation with interactive activity on the components of exercise prescription. Introduces F.I.T.T principle from ACSM for exercise prescriptions. Reviews F.I.T.T. principles of flexibility and balance exercise training including progression. Also discusses the mind body connection.  Reviews various relaxation techniques to help reduce and manage stress (i.e. Deep breathing, progressive muscle relaxation, and visualization). Balance handout provided to take home. Written material given at graduation.   Activity Barriers & Risk Stratification:  Activity Barriers & Cardiac Risk Stratification - 09/28/23 1045       Activity Barriers & Cardiac Risk Stratification   Activity Barriers Back Problems;Assistive Device;Other (comment)    Comments Lifting restriction of no more than 10 lbs until 11/07/2023. Wears a back brace and uses a cane in public    Cardiac Risk Stratification High          6 Minute Walk:  6 Minute Walk     Row Name 09/28/23 1044         6 Minute Walk   Phase Initial     Distance 1140 feet     Walk Time 6 minutes     # of Rest Breaks 0     MPH 2.16     METS 2.03     RPE 13      Perceived Dyspnea  0     VO2 Peak 7.09     Symptoms No     Resting HR 52 bpm     Resting BP 140/60     Resting Oxygen Saturation  98 %     Exercise Oxygen Saturation  during 6 min walk 97 %     Max Ex. HR 72 bpm     Max Ex.  BP 144/60     2 Minute Post BP 100/60        Oxygen Initial Assessment:   Oxygen Re-Evaluation:   Oxygen Discharge (Final Oxygen Re-Evaluation):   Initial Exercise Prescription:  Initial Exercise Prescription - 09/28/23 1000       Date of Initial Exercise RX and Referring Provider   Date 09/28/23    Referring Provider Ammon Blunt, MD      Oxygen   Maintain Oxygen Saturation 88% or higher      Treadmill   MPH 1.4    Grade 0    Minutes 15    METs 2.07      Recumbant Bike   Level 2    RPM 50    Watts 25    Minutes 15    METs 2.03      NuStep   Level 2   T4 and T6   SPM 80    Minutes 15    METs 2.03      T5 Nustep   Level 2    SPM 80    Minutes 15    METs 2.03      Track   Laps 22    Minutes 15    METs 2.2      Prescription Details   Frequency (times per week) 3    Duration Progress to 30 minutes of continuous aerobic without signs/symptoms of physical distress      Intensity   THRR 40-80% of Max Heartrate 85-119    Ratings of Perceived Exertion 11-13    Perceived Dyspnea 0-4      Progression   Progression Continue to progress workloads to maintain intensity without signs/symptoms of physical distress.      Resistance Training   Training Prescription Yes    Weight 3 lb    Reps 10-15          Perform Capillary Blood Glucose checks as needed.  Exercise Prescription Changes:   Exercise Prescription Changes     Row Name 09/28/23 1000 10/18/23 1000           Response to Exercise   Blood Pressure (Admit) 140/60 132/74      Blood Pressure (Exercise) 144/60 132/67      Blood Pressure (Exit) 100/60 104/64      Heart Rate (Admit) 52 bpm 68 bpm      Heart Rate (Exercise) 72 bpm 100 bpm      Heart Rate  (Exit) 56 bpm 60 bpm      Oxygen Saturation (Admit) 98 % --      Oxygen Saturation (Exercise) 97 % --      Oxygen Saturation (Exit) 98 % --      Rating of Perceived Exertion (Exercise) 13 --      Perceived Dyspnea (Exercise) 0 --      Symptoms none none      Comments results First 2 weeks of exercise sessions      Duration -- Continue with 30 min of aerobic exercise without signs/symptoms of physical distress.      Intensity THRR New THRR unchanged        Progression   Progression -- Continue to progress workloads to maintain intensity without signs/symptoms of physical distress.      Average METs 2.03 2.82        Resistance Training   Training Prescription -- Yes      Weight -- 3 lb      Reps -- 10-15  Interval Training   Interval Training -- No        Treadmill   MPH -- 3      Grade -- 0      Minutes -- 15      METs -- 3.3        Recumbant Bike   Level -- 2      Watts -- 19      Minutes -- 15      METs -- 3.06        NuStep   Level -- 4  T6      Minutes -- 15      METs -- 2.2        T5 Nustep   Level -- 1      Minutes -- 15      METs -- 2.1        Oxygen   Maintain Oxygen Saturation -- 88% or higher         Exercise Comments:   Exercise Comments     Row Name 10/05/23 0946           Exercise Comments First full day of exercise!  Patient was oriented to gym and equipment including functions, settings, policies, and procedures.  Patient's individual exercise prescription and treatment plan were reviewed.  All starting workloads were established based on the results of the 6 minute walk test done at initial orientation visit.  The plan for exercise progression was also introduced and progression will be customized based on patient's performance and goals.          Exercise Goals and Review:   Exercise Goals     Row Name 09/28/23 1049             Exercise Goals   Increase Physical Activity Yes       Intervention Provide advice,  education, support and counseling about physical activity/exercise needs.;Develop an individualized exercise prescription for aerobic and resistive training based on initial evaluation findings, risk stratification, comorbidities and participant's personal goals.       Expected Outcomes Short Term: Attend rehab on a regular basis to increase amount of physical activity.;Long Term: Add in home exercise to make exercise part of routine and to increase amount of physical activity.;Long Term: Exercising regularly at least 3-5 days a week.       Increase Strength and Stamina Yes       Intervention Provide advice, education, support and counseling about physical activity/exercise needs.;Develop an individualized exercise prescription for aerobic and resistive training based on initial evaluation findings, risk stratification, comorbidities and participant's personal goals.       Expected Outcomes Short Term: Increase workloads from initial exercise prescription for resistance, speed, and METs.;Short Term: Perform resistance training exercises routinely during rehab and add in resistance training at home;Long Term: Improve cardiorespiratory fitness, muscular endurance and strength as measured by increased METs and functional capacity ( )       Able to understand and use rate of perceived exertion (RPE) scale Yes       Intervention Provide education and explanation on how to use RPE scale       Expected Outcomes Short Term: Able to use RPE daily in rehab to express subjective intensity level;Long Term:  Able to use RPE to guide intensity level when exercising independently       Able to understand and use Dyspnea scale Yes       Intervention Provide education and explanation on how to use Dyspnea  scale       Expected Outcomes Short Term: Able to use Dyspnea scale daily in rehab to express subjective sense of shortness of breath during exertion;Long Term: Able to use Dyspnea scale to guide intensity level when  exercising independently       Knowledge and understanding of Target Heart Rate Range (THRR) Yes       Intervention Provide education and explanation of THRR including how the numbers were predicted and where they are located for reference       Expected Outcomes Short Term: Able to state/look up THRR;Short Term: Able to use daily as guideline for intensity in rehab;Long Term: Able to use THRR to govern intensity when exercising independently       Able to check pulse independently Yes       Intervention Provide education and demonstration on how to check pulse in carotid and radial arteries.;Review the importance of being able to check your own pulse for safety during independent exercise       Expected Outcomes Short Term: Able to explain why pulse checking is important during independent exercise;Long Term: Able to check pulse independently and accurately       Understanding of Exercise Prescription Yes       Intervention Provide education, explanation, and written materials on patient's individual exercise prescription       Expected Outcomes Short Term: Able to explain program exercise prescription;Long Term: Able to explain home exercise prescription to exercise independently          Exercise Goals Re-Evaluation :  Exercise Goals Re-Evaluation     Row Name 10/05/23 1115 10/18/23 1049 10/26/23 0922         Exercise Goal Re-Evaluation   Exercise Goals Review Increase Physical Activity;Able to understand and use rate of perceived exertion (RPE) scale;Knowledge and understanding of Target Heart Rate Range (THRR);Understanding of Exercise Prescription;Increase Strength and Stamina;Able to check pulse independently Increase Physical Activity;Understanding of Exercise Prescription;Increase Strength and Stamina Increase Physical Activity;Increase Strength and Stamina;Understanding of Exercise Prescription     Comments Reviewed RPE and dyspnea scale, THR and program prescription with pt today.   Pt voiced understanding and was given a copy of goals to take home. Adam Frost is off to a good start in the program and he completed his first 2 weeks of exercise sessions in this review. He tolerated his exercise prescription well. HE was able to increase his workload on the treadmill to a speed of 3 mph with no incline. He also already increased to level 4 on the T6 nustep. He did well with level 2 on the recumbent bike and level 1 on the T5 nustep. We will continue to monitor his progress in the program. Adam Frost is doing well at rehab, he reports his legs and arms are weak but he still comes because he knows this will help them become stronger. He walks a mile at least one day of the week. But it has been very hot lately.     Expected Outcomes Short: Use RPE daily to regulate intensity. Long: Follow program prescription in THR. Short: Continue to follow current exercise prescription. Long: Continue exercise to improve strength and stamina. STG: Continue to attend rehab. LTG: Continue exercise to improve strength and stamina.        Discharge Exercise Prescription (Final Exercise Prescription Changes):  Exercise Prescription Changes - 10/18/23 1000       Response to Exercise   Blood Pressure (Admit) 132/74    Blood Pressure (Exercise)  132/67    Blood Pressure (Exit) 104/64    Heart Rate (Admit) 68 bpm    Heart Rate (Exercise) 100 bpm    Heart Rate (Exit) 60 bpm    Symptoms none    Comments First 2 weeks of exercise sessions    Duration Continue with 30 min of aerobic exercise without signs/symptoms of physical distress.    Intensity THRR unchanged      Progression   Progression Continue to progress workloads to maintain intensity without signs/symptoms of physical distress.    Average METs 2.82      Resistance Training   Training Prescription Yes    Weight 3 lb    Reps 10-15      Interval Training   Interval Training No      Treadmill   MPH 3    Grade 0    Minutes 15    METs 3.3       Recumbant Bike   Level 2    Watts 19    Minutes 15    METs 3.06      NuStep   Level 4   T6   Minutes 15    METs 2.2      T5 Nustep   Level 1    Minutes 15    METs 2.1      Oxygen   Maintain Oxygen Saturation 88% or higher          Nutrition:  Target Goals: Understanding of nutrition guidelines, daily intake of sodium 1500mg , cholesterol 200mg , calories 30% from fat and 7% or less from saturated fats, daily to have 5 or more servings of fruits and vegetables.  Education: All About Nutrition: -Group instruction provided by verbal, written material, interactive activities, discussions, models, and posters to present general guidelines for heart healthy nutrition including fat, fiber, MyPlate, the role of sodium in heart healthy nutrition, utilization of the nutrition label, and utilization of this knowledge for meal planning. Follow up email sent as well. Written material given at graduation. Flowsheet Row Cardiac Rehab from 11/02/2023 in [facility] Cardiac and Pulmonary Rehab  Education need identified 09/28/23  Date 10/12/23  Educator jg part 2  Instruction Review Code 1- Verbalizes Understanding    Biometrics:  Pre Biometrics - 09/28/23 1050       Pre Biometrics   Height 5' 11.1 (1.806 m)    Weight 163 lb 14.4 oz (74.3 kg)    Waist Circumference 39.4 inches    Hip Circumference 38.5 inches    Waist to Hip Ratio 1.02 %    BMI (Calculated) 22.79    Single Leg Stand 4.3 seconds           Nutrition Therapy Plan and Nutrition Goals:  Nutrition Therapy & Goals - 09/28/23 1052       Personal Nutrition Goals   Nutrition Goal Wants to think about scheduling appointment with RD in the coming weeks      Intervention Plan   Intervention Prescribe, educate and counsel regarding individualized specific dietary modifications aiming towards targeted core components such as weight, hypertension, lipid management, diabetes, heart failure and other comorbidities.     Expected Outcomes Short Term Goal: Understand basic principles of dietary content, such as calories, fat, sodium, cholesterol and nutrients.          Nutrition Assessments:  MEDIFICTS Score Key: >=70 Need to make dietary changes  40-70 Heart Healthy Diet <= 40 Therapeutic Level Cholesterol Diet  Flowsheet Row Cardiac Rehab from 09/28/2023 in  [facility] Cardiac and Pulmonary Rehab  Picture Your Plate Total Score on Admission 55   Picture Your Plate Scores: <59 Unhealthy dietary pattern with much room for improvement. 41-50 Dietary pattern unlikely to meet recommendations for good health and room for improvement. 51-60 More healthful dietary pattern, with some room for improvement.  >60 Healthy dietary pattern, although there may be some specific behaviors that could be improved.    Nutrition Goals Re-Evaluation:  Nutrition Goals Re-Evaluation     Row Name 10/26/23 0931             Goals   Comment Viral reports he is trying to eat heart healthy. reports he is monitoring his salt intake and making sure to drink enough water throughout the day       Expected Outcome STG: read facts labels and stay below 1500mg  Sodium and drink 48-64oz of water daily. LTG: Follow a heart healthy lifestyle          Nutrition Goals Discharge (Final Nutrition Goals Re-Evaluation):  Nutrition Goals Re-Evaluation - 10/26/23 0931       Goals   Comment Adam Frost reports he is trying to eat heart healthy. reports he is monitoring his salt intake and making sure to drink enough water throughout the day    Expected Outcome STG: read facts labels and stay below 1500mg  Sodium and drink 48-64oz of water daily. LTG: Follow a heart healthy lifestyle          Psychosocial: Target Goals: Acknowledge presence or absence of significant depression and/or stress, maximize coping skills, provide positive support system. Participant is able to verbalize types and ability to use techniques and skills needed for reducing  stress and depression.   Education: Stress, Anxiety, and Depression - Group verbal and visual presentation to define topics covered.  Reviews how body is impacted by stress, anxiety, and depression.  Also discusses healthy ways to reduce stress and to treat/manage anxiety and depression.  Written material given at graduation.   Education: Sleep Hygiene -Provides group verbal and written instruction about how sleep can affect your health.  Define sleep hygiene, discuss sleep cycles and impact of sleep habits. Review good sleep hygiene tips.    Initial Review & Psychosocial Screening:  Initial Psych Review & Screening - 09/22/23 1343       Initial Review   Current issues with None Identified      Family Dynamics   Good Support System? Yes   wife, has problems off and on, daughter close by     Barriers   Psychosocial barriers to participate in program There are no identifiable barriers or psychosocial needs.      Screening Interventions   Interventions To provide support and resources with identified psychosocial needs;Provide feedback about the scores to participant    Expected Outcomes Short Term goal: Utilizing psychosocial counselor, staff and physician to assist with identification of specific Stressors or current issues interfering with healing process. Setting desired goal for each stressor or current issue identified.;Long Term Goal: Stressors or current issues are controlled or eliminated.;Short Term goal: Identification and review with participant of any Quality of Life or Depression concerns found by scoring the questionnaire.;Long Term goal: The participant improves quality of Life and PHQ9 Scores as seen by post scores and/or verbalization of changes          Quality of Life Scores:   Quality of Life - 09/28/23 1051       Quality of Life   Select Quality of Life  Quality of Life Scores   Health/Function Pre 22.77 %    Socioeconomic Pre 21.75 %    Psych/Spiritual  Pre 23.57 %    Family Pre 22.5 %    GLOBAL Pre 22.66 %         Scores of 19 and below usually indicate a poorer quality of life in these areas.  A difference of  2-3 points is a clinically meaningful difference.  A difference of 2-3 points in the total score of the Quality of Life Index has been associated with significant improvement in overall quality of life, self-image, physical symptoms, and general health in studies assessing change in quality of life.  PHQ-9: Review Flowsheet  More data exists      09/28/2023 04/16/2019 04/13/2018 10/11/2017 03/17/2017  Depression screen PHQ 2/9  Decreased Interest 0 0 0 0 0  Down, Depressed, Hopeless 0 0 0 0 0  PHQ - 2 Score 0 0 0 0 0  Altered sleeping 1 - - - 0  Tired, decreased energy 1 - - - 0  Change in appetite 1 - - - 0  Feeling bad or failure about yourself  0 - - - 0  Trouble concentrating 0 - - - 0  Moving slowly or fidgety/restless 0 - - - 0  Suicidal thoughts 0 - - - 0  PHQ-9 Score 3 - - - 0  Difficult doing work/chores Not difficult at all - - - Not difficult at all   Interpretation of Total Score  Total Score Depression Severity:  1-4 = Minimal depression, 5-9 = Mild depression, 10-14 = Moderate depression, 15-19 = Moderately severe depression, 20-27 = Severe depression   Psychosocial Evaluation and Intervention:  Psychosocial Evaluation - 09/22/23 1352       Psychosocial Evaluation & Interventions   Interventions Encouraged to exercise with the program and follow exercise prescription    Comments There are no barriers to attending the program. He live with hs wife; she and his daughter are his support. He said hi wife is off and on with her health .   he is hoping to gain back the 10 pounds he lost in the surgery/hospital process. He has heard about the program and is attendng with the recommendation of family and his Careers adviser.    Expected Outcomes STG attend all scheduled sessions, work on exercise progression. LTG able to  continue with exercise progression after discharge    Continue Psychosocial Services  Follow up required by staff          Psychosocial Re-Evaluation:  Psychosocial Re-Evaluation     Row Name 10/26/23 0929             Psychosocial Re-Evaluation   Current issues with None Identified       Comments Adam Frost denies any stress, anxiety, or depression at this time. he reports he sleeps well, though he wakes a few times to use bathroom       Expected Outcomes STG: Continue to attended rehab and focus on good sleep. LTG: Achieve and maintain and positive outlook on health and daily life       Interventions Encouraged to attend Cardiac Rehabilitation for the exercise       Continue Psychosocial Services  Follow up required by staff          Psychosocial Discharge (Final Psychosocial Re-Evaluation):  Psychosocial Re-Evaluation - 10/26/23 0929       Psychosocial Re-Evaluation   Current issues with None Identified    Comments  Adam Frost denies any stress, anxiety, or depression at this time. he reports he sleeps well, though he wakes a few times to use bathroom    Expected Outcomes STG: Continue to attended rehab and focus on good sleep. LTG: Achieve and maintain and positive outlook on health and daily life    Interventions Encouraged to attend Cardiac Rehabilitation for the exercise    Continue Psychosocial Services  Follow up required by staff          Vocational Rehabilitation: Provide vocational rehab assistance to qualifying candidates.   Vocational Rehab Evaluation & Intervention:   Education: Education Goals: Education classes will be provided on a variety of topics geared toward better understanding of heart health and risk factor modification. Participant will state understanding/return demonstration of topics presented as noted by education test scores.  Learning Barriers/Preferences:   General Cardiac Education Topics:  AED/CPR: - Group verbal and written instruction  with the use of models to demonstrate the basic use of the AED with the basic ABC's of resuscitation.   Anatomy and Cardiac Procedures: - Group verbal and visual presentation and models provide information about basic cardiac anatomy and function. Reviews the testing methods done to diagnose heart disease and the outcomes of the test results. Describes the treatment choices: Medical Management, Angioplasty, or Coronary Bypass Surgery for treating various heart conditions including Myocardial Infarction, Angina, Valve Disease, and Cardiac Arrhythmias.  Written material given at graduation.   Medication Safety: - Group verbal and visual instruction to review commonly prescribed medications for heart and lung disease. Reviews the medication, class of the drug, and side effects. Includes the steps to properly store meds and maintain the prescription regimen.  Written material given at graduation. Flowsheet Row Cardiac Rehab from 11/02/2023 in [facility] Cardiac and Pul[facility] Rehab  Date 10/26/23  Educator sb  Instruction Review Code 1- Verbalizes Understanding    Intimacy: - Group verbal instruction through game format to discuss how heart and lung disease can affect sexual intimacy. Written material given at graduation..   Know Your Numbers and Heart Failure: - Group verbal and visual instruction to discuss disease risk factors for cardiac and pulmonary disease and treatment options.  Reviews associated critical values for Overweight/Obesity, Hypertension, Cholesterol, and Diabetes.  Discusses basics of heart failure: signs/symptoms and treatments.  Introduces Heart Failure Zone chart for action plan for heart failure.  Written material given at graduation. Flowsheet Row Cardiac Rehab from 11/02/2023 in [facility] Cardiac and Pulmonary Rehab  Date 11/02/23  Educator sb  Instruction Review Code 1- Verbalizes Understanding    Infection Prevention: - Provides verbal and written material to individual with  discussion of infection control including proper hand washing and proper equipment cleaning during exercise session. Flowsheet Row Cardiac Rehab from 11/02/2023 in [facility] Cardiac and Pulmonary Rehab  Date 09/28/23  Educator MB  Instruction Review Code 1- Verbalizes Understanding    Falls Prevention: - Provides verbal and written material to individual with discussion of falls prevention and safety. Flowsheet Row Cardiac Rehab from 11/02/2023 in [facility] Cardiac and Pulmonary Rehab  Date 09/28/23  Educator MB  Instruction Review Code 1- Verbalizes Understanding    Other: -Provides group and verbal instruction on various topics (see comments)   Knowledge Questionnaire Score:  Knowledge Questionnaire Score - 09/28/23 1053       Knowledge Questionnaire Score   Pre Score 23/26          Core Components/Risk Factors/Patient Goals at Admission:  Personal Goals and Risk Factors at Admission -  09/28/23 1053       Core Components/Risk Factors/Patient Goals on Admission    Weight Management Yes;Weight Gain    Intervention Weight Management: Develop a combined nutrition and exercise program designed to reach desired caloric intake, while maintaining appropriate intake of nutrient and fiber, sodium and fats, and appropriate energy expenditure required for the weight goal.;Weight Management: Provide education and appropriate resources to help participant work on and attain dietary goals.;Weight Management/Obesity: Establish reasonable short term and long term weight goals.    Admit Weight 163 lb 14.4 oz (74.3 kg)    Goal Weight: Short Term 165 lb (74.8 kg)    Goal Weight: Long Term 170 lb (77.1 kg)    Expected Outcomes Short Term: Continue to assess and modify interventions until short term weight is achieved;Long Term: Adherence to nutrition and physical activity/exercise program aimed toward attainment of established weight goal;Understanding of distribution of calorie intake throughout the day with  the consumption of 4-5 meals/snacks;Weight Gain: Understanding of general recommendations for a high calorie, high protein meal plan that promotes weight gain by distributing calorie intake throughout the day with the consumption for 4-5 meals, snacks, and/or supplements;Understanding recommendations for meals to include 15-35% energy as protein, 25-35% energy from fat, 35-60% energy from carbohydrates, less than 200mg  of dietary cholesterol, 20-35 gm of total fiber daily    Diabetes Yes    Intervention Provide education about signs/symptoms and action to take for hypo/hyperglycemia.;Provide education about proper nutrition, including hydration, and aerobic/resistive exercise prescription along with prescribed medications to achieve blood glucose in normal ranges: Fasting glucose 65-99 mg/dL    Expected Outcomes Short Term: Participant verbalizes understanding of the signs/symptoms and immediate care of hyper/hypoglycemia, proper foot care and importance of medication, aerobic/resistive exercise and nutrition plan for blood glucose control.;Long Term: Attainment of HbA1C < 7%.    Hypertension Yes    Intervention Provide education on lifestyle modifcations including regular physical activity/exercise, weight management, moderate sodium restriction and increased consumption of fresh fruit, vegetables, and low fat dairy, alcohol moderation, and smoking cessation.;Monitor prescription use compliance.    Expected Outcomes Short Term: Continued assessment and intervention until BP is < 140/74mm HG in hypertensive participants. < 130/73mm HG in hypertensive participants with diabetes, heart failure or chronic kidney disease.;Long Term: Maintenance of blood pressure at goal levels.    Lipids Yes    Intervention Provide education and support for participant on nutrition & aerobic/resistive exercise along with prescribed medications to achieve LDL 70mg , HDL >40mg .    Expected Outcomes Short Term: Participant states  understanding of desired cholesterol values and is compliant with medications prescribed. Participant is following exercise prescription and nutrition guidelines.;Long Term: Cholesterol controlled with medications as prescribed, with individualized exercise RX and with personalized nutrition plan. Value goals: LDL < 70mg , HDL > 40 mg.          Education:Diabetes - Individual verbal and written instruction to review signs/symptoms of diabetes, desired ranges of glucose level fasting, after meals and with exercise. Acknowledge that pre and post exercise glucose checks will be done for 3 sessions at entry of program. Flowsheet Row Cardiac Rehab from 11/02/2023 in [facility] Cardiac and Pulmonary Rehab  Date 09/28/23  Educator MB  Instruction Review Code 1- Verbalizes Understanding    Core Components/Risk Factors/Patient Goals Review:   Goals and Risk Factor Review     Row Name 10/26/23 0932             Core Components/Risk Factors/Patient Goals Review   Personal Goals  Review Hypertension;Diabetes       Review Adam Frost reports he does not check his BP at home, but does check his blood sugars. He has been working to bring his A1C down below 7%. Based on his recent blood sugars he feels he is doing well and can bring his A1C down       Expected Outcomes STG: Check BP at home. LTG: manage risk factors independently          Core Components/Risk Factors/Patient Goals at Discharge (Final Review):   Goals and Risk Factor Review - 10/26/23 0932       Core Components/Risk Factors/Patient Goals Review   Personal Goals Review Hypertension;Diabetes    Review Adam Frost reports he does not check his BP at home, but does check his blood sugars. He has been working to bring his A1C down below 7%. Based on his recent blood sugars he feels he is doing well and can bring his A1C down    Expected Outcomes STG: Check BP at home. LTG: manage risk factors independently          ITP Comments:  ITP Comments      Row Name 09/22/23 1356 09/28/23 1044 10/05/23 0946 10/05/23 0950 11/02/23 0942   ITP Comments Virtual orientation call completed today. he has an appointment on Date: 09/28/2023  for EP eval and gym Orientation.  Documentation of diagnosis can be found in [facility] 08/08/2023 . Completed and gym orientation for cardiac rehab. Initial ITP created and sent for review to Dr. Oneil Pinal, Medical Director. First full day of exercise!  Patient was oriented to gym and equipment including functions, settings, policies, and procedures.  Patient's individual exercise prescription and treatment plan were reviewed.  All starting workloads were established based on the results of the 6 minute walk test done at initial orientation visit.  The plan for exercise progression was also introduced and progression will be customized based on patient's performance and goals. 30 Day review completed. Medical Director ITP review done, changes made as directed, and signed approval by Medical Director.    new to program 30 Day review completed. Medical Director ITP review done, changes made as directed, and signed approval by Medical Director.      Comments: 30 day review

## 2023-11-02 NOTE — Progress Notes (Signed)
 Daily Session Note  Patient Details  Name: Adam Frost MRN: [id number] Date of Birth: 02/08/1941 Referring Provider:   Flowsheet Row Cardiac Rehab from 09/28/2023 in [facility] Cardiac and Pulmonary Rehab  Referring Provider Ammon Blunt, MD    Encounter Date: 11/02/2023  Check In:  Session Check In - 11/02/23 0918       Check-In   Supervising physician immediately available to respond to emergencies See telemetry face sheet for immediately available ER MD    Location [facility]-Cardiac & Pulmonary Rehab    Staff Present Rollene Paterson, MS, Exercise Physiologist;Jakyra Kenealy RN,BSN,MPA;Maxon Conetta BS, Exercise Physiologist;Joseph Rolinda RCP,RRT,BSRT    Virtual Visit No    Medication changes reported     No    Fall or balance concerns reported    No    Warm-up and Cool-down Performed on first and last piece of equipment    Resistance Training Performed Yes    VAD Patient? No    PAD/SET Patient? No      Pain Assessment   Currently in Pain? No/denies             Social History   Tobacco Use  Smoking Status Never  Smokeless Tobacco Never    Goals Met:  Independence with exercise equipment Exercise tolerated well No report of concerns or symptoms today Strength training completed today  Goals Unmet:  Not Applicable  Comments: Pt able to follow exercise prescription today without complaint.  Will continue to monitor for progression.    Dr. Oneil Pinal is Medical Director for [facility] Cardiac Rehabilitation.  Dr. Fuad Aleskerov is Medical Director for [facility] Pulmonary Rehabilitation.

## 2023-11-02 NOTE — Progress Notes (Signed)
 30 Day review completed. Medical Director ITP review done, changes made as directed, and signed approval by Medical Director. ? ?

## 2023-11-03 ENCOUNTER — Encounter: Admitting: *Deleted

## 2023-11-03 DIAGNOSIS — Z48812 Encounter for surgical aftercare following surgery on the circulatory system: Secondary | ICD-10-CM | POA: Diagnosis not present

## 2023-11-03 DIAGNOSIS — Z951 Presence of aortocoronary bypass graft: Secondary | ICD-10-CM

## 2023-11-03 NOTE — Progress Notes (Signed)
 Daily Session Note  Patient Details  Name: Adam Frost MRN: [id number] Date of Birth: 02-Aug-1940 Referring Provider:   Flowsheet Row Cardiac Rehab from 09/28/2023 in [facility] Cardiac and Pulmonary Rehab  Referring Provider Ammon Blunt, MD    Encounter Date: 11/03/2023  Check In:  Session Check In - 11/03/23 0945       Check-In   Supervising physician immediately available to respond to emergencies See telemetry face sheet for immediately available ER MD    Location [facility]-Cardiac & Pulmonary Rehab    Staff Present Othel Durand, RN, BSN, CCRP;Maxon Conetta BS, Exercise Physiologist;Jason Elnor RDN,LDN;Joseph [employer]    Virtual Visit No    Medication changes reported     No    Fall or balance concerns reported    No    Warm-up and Cool-down Performed on first and last piece of equipment    Resistance Training Performed Yes    VAD Patient? No    PAD/SET Patient? No      Pain Assessment   Currently in Pain? No/denies             Social History   Tobacco Use  Smoking Status Never  Smokeless Tobacco Never    Goals Met:  Independence with exercise equipment Exercise tolerated well No report of concerns or symptoms today  Goals Unmet:  Not Applicable  Comments: Pt able to follow exercise prescription today without complaint.  Will continue to monitor for progression.    Dr. Oneil Pinal is Medical Director for [facility] Cardiac Rehabilitation.  Dr. Fuad Aleskerov is Medical Director for [facility] Pulmonary Rehabilitation.

## 2023-11-07 ENCOUNTER — Encounter

## 2023-11-07 DIAGNOSIS — Z48812 Encounter for surgical aftercare following surgery on the circulatory system: Secondary | ICD-10-CM | POA: Diagnosis not present

## 2023-11-07 DIAGNOSIS — Z951 Presence of aortocoronary bypass graft: Secondary | ICD-10-CM

## 2023-11-07 NOTE — Progress Notes (Signed)
 Daily Session Note  Patient Details  Name: HALLEY KINCER MRN: [id number] Date of Birth: 03-Apr-1941 Referring Provider:   Flowsheet Row Cardiac Rehab from 09/28/2023 in [facility] Cardiac and Pulmonary Rehab  Referring Provider Ammon Blunt, MD    Encounter Date: 11/07/2023  Check In:  Session Check In - 11/07/23 0901       Check-In   Supervising physician immediately available to respond to emergencies See telemetry face sheet for immediately available ER MD    Location [facility]-Cardiac & Pulmonary Rehab    Staff Present Hoy Rodney RN,BSN;Jason Elnor RDN,LDN;Dalilah Curlin RN,BSN,MPA;Joseph Rolinda RCP,RRT,BSRT    Virtual Visit No    Medication changes reported     No    Fall or balance concerns reported    No    Warm-up and Cool-down Performed on first and last piece of equipment    Resistance Training Performed Yes    VAD Patient? No    PAD/SET Patient? No      Pain Assessment   Currently in Pain? No/denies             Social History   Tobacco Use  Smoking Status Never  Smokeless Tobacco Never    Goals Met:  Independence with exercise equipment Exercise tolerated well No report of concerns or symptoms today Strength training completed today  Goals Unmet:  Not Applicable  Comments: Pt able to follow exercise prescription today without complaint.  Will continue to monitor for progression.   Reviewed home exercise with pt today.  Pt plans to walk and use hand weights for exercise.  Reviewed THR, pulse, RPE, sign and symptoms, pulse oximetery and when to call 911 or MD.  Also discussed weather considerations and indoor options.  Pt voiced understanding.   Dr. Oneil Pinal is Medical Director for [facility] Cardiac Rehabilitation.  Dr. Fuad Aleskerov is Medical Director for [facility] Pulmonary Rehabilitation.

## 2023-11-09 ENCOUNTER — Encounter

## 2023-11-09 DIAGNOSIS — Z951 Presence of aortocoronary bypass graft: Secondary | ICD-10-CM

## 2023-11-09 DIAGNOSIS — Z48812 Encounter for surgical aftercare following surgery on the circulatory system: Secondary | ICD-10-CM | POA: Diagnosis not present

## 2023-11-09 NOTE — Progress Notes (Signed)
 Daily Session Note  Patient Details  Name: Adam Frost MRN: [id number] Date of Birth: Mar 09, 1941 Referring Provider:   Flowsheet Row Cardiac Rehab from 09/28/2023 in [facility] Cardiac and Pulmonary Rehab  Referring Provider Ammon Blunt, MD    Encounter Date: 11/09/2023  Check In:  Session Check In - 11/09/23 0918       Check-In   Supervising physician immediately available to respond to emergencies See telemetry face sheet for immediately available ER MD    Location [facility]-Cardiac & Pulmonary Rehab    Staff Present Burnard Davenport RN,BSN,MPA;Joseph Rolinda RCP,RRT,BSRT;Jason Elnor RDN,LDN;Margaret Best, MS, Exercise Physiologist    Virtual Visit No    Medication changes reported     No    Fall or balance concerns reported    No    Warm-up and Cool-down Performed on first and last piece of equipment    Resistance Training Performed Yes    VAD Patient? No    PAD/SET Patient? No      Pain Assessment   Currently in Pain? No/denies             Social History   Tobacco Use  Smoking Status Never  Smokeless Tobacco Never    Goals Met:  Independence with exercise equipment Exercise tolerated well No report of concerns or symptoms today Strength training completed today  Goals Unmet:  Not Applicable  Comments: Pt able to follow exercise prescription today without complaint.  Will continue to monitor for progression.    Dr. Oneil Pinal is Medical Director for [facility] Cardiac Rehabilitation.  Dr. Fuad Aleskerov is Medical Director for [facility] Pulmonary Rehabilitation.

## 2023-11-10 ENCOUNTER — Encounter: Admitting: *Deleted

## 2023-11-10 DIAGNOSIS — Z48812 Encounter for surgical aftercare following surgery on the circulatory system: Secondary | ICD-10-CM | POA: Diagnosis not present

## 2023-11-10 DIAGNOSIS — Z951 Presence of aortocoronary bypass graft: Secondary | ICD-10-CM

## 2023-11-10 NOTE — Progress Notes (Signed)
 Daily Session Note  Patient Details  Name: Adam Frost MRN: [id number] Date of Birth: 23-Oct-1940 Referring Provider:   Flowsheet Row Cardiac Rehab from 09/28/2023 in [facility] Cardiac and Pulmonary Rehab  Referring Provider Ammon Blunt, MD    Encounter Date: 11/10/2023  Check In:  Session Check In - 11/10/23 9077       Check-In   Supervising physician immediately available to respond to emergencies See telemetry face sheet for immediately available ER MD    Location [facility]-Cardiac & Pulmonary Rehab    Staff Present Othel Durand, RN, BSN, CCRP;Joseph Hood RCP,RRT,BSRT;Laureen Norway, [place], RRT, CPFT;Meredith Tressa RN,BSN;Jason Elnor RDN,LDN    Virtual Visit No    Medication changes reported     No    Fall or balance concerns reported    No    Warm-up and Cool-down Performed on first and last piece of equipment    Resistance Training Performed Yes    VAD Patient? No    PAD/SET Patient? No      Pain Assessment   Currently in Pain? No/denies             Social History   Tobacco Use  Smoking Status Never  Smokeless Tobacco Never    Goals Met:  Independence with exercise equipment Exercise tolerated well No report of concerns or symptoms today  Goals Unmet:  Not Applicable  Comments: Pt able to follow exercise prescription today without complaint.  Will continue to monitor for progression.    Dr. Oneil Pinal is Medical Director for [facility] Cardiac Rehabilitation.  Dr. Fuad Aleskerov is Medical Director for [facility] Pulmonary Rehabilitation.

## 2023-11-14 ENCOUNTER — Encounter

## 2023-11-14 DIAGNOSIS — Z951 Presence of aortocoronary bypass graft: Secondary | ICD-10-CM

## 2023-11-14 DIAGNOSIS — Z48812 Encounter for surgical aftercare following surgery on the circulatory system: Secondary | ICD-10-CM | POA: Diagnosis not present

## 2023-11-14 NOTE — Progress Notes (Signed)
 Daily Session Note  Patient Details  Name: TYTUS STRAHLE MRN: [id number] Date of Birth: May 09, 1940 Referring Provider:   Flowsheet Row Cardiac Rehab from 09/28/2023 in [facility] Cardiac and Pulmonary Rehab  Referring Provider Ammon Blunt, MD    Encounter Date: 11/14/2023  Check In:  Session Check In - 11/14/23 1118       Check-In   Supervising physician immediately available to respond to emergencies See telemetry face sheet for immediately available ER MD    Location [facility]-Cardiac & Pulmonary Rehab    Staff Present Hoy Rodney RN,BSN;Jason Elnor RDN,LDN;Temari Schooler RN,BSN,MPA;Joseph Rolinda RCP,RRT,BSRT;Kristen Coble RN,BC,MSN    Virtual Visit No    Medication changes reported     No    Fall or balance concerns reported    No    Warm-up and Cool-down Performed on first and last piece of equipment    Resistance Training Performed Yes    VAD Patient? No    PAD/SET Patient? No      Pain Assessment   Currently in Pain? No/denies             Social History   Tobacco Use  Smoking Status Never  Smokeless Tobacco Never    Goals Met:  Independence with exercise equipment Exercise tolerated well No report of concerns or symptoms today Strength training completed today  Goals Unmet:  Not Applicable  Comments: Pt able to follow exercise prescription today without complaint.  Will continue to monitor for progression.    Dr. Oneil Pinal is Medical Director for [facility] Cardiac Rehabilitation.  Dr. Fuad Aleskerov is Medical Director for [facility] Pulmonary Rehabilitation.

## 2023-11-16 ENCOUNTER — Encounter

## 2023-11-16 DIAGNOSIS — Z48812 Encounter for surgical aftercare following surgery on the circulatory system: Secondary | ICD-10-CM | POA: Diagnosis not present

## 2023-11-16 DIAGNOSIS — Z951 Presence of aortocoronary bypass graft: Secondary | ICD-10-CM

## 2023-11-16 NOTE — Progress Notes (Signed)
 Daily Session Note  Patient Details  Name: Adam Frost MRN: [id number] Date of Birth: [birth year] Referring Provider:   Flowsheet Row Cardiac Rehab from 09/28/2023 in [facility] Cardiac and Pulmonary Rehab  Referring Provider Ammon Blunt, MD    Encounter Date: 11/16/2023  Check In:  Session Check In - 11/16/23 0914       Check-In   Supervising physician immediately available to respond to emergencies See telemetry face sheet for immediately available ER MD    Location [facility]-Cardiac & Pulmonary Rehab    Staff Present Burnard Davenport RN,BSN,MPA;Maxon Burnell BS, Exercise Physiologist;Joseph [facility] Dyane BS, ACSM CEP, Exercise Physiologist;Jason Elnor RDN,LDN    Virtual Visit No    Medication changes reported     No    Fall or balance concerns reported    No    Warm-up and Cool-down Performed on first and last piece of equipment    Resistance Training Performed Yes    VAD Patient? No    PAD/SET Patient? No      Pain Assessment   Currently in Pain? No/denies             Social History   Tobacco Use  Smoking Status Never  Smokeless Tobacco Never    Goals Met:  Independence with exercise equipment Exercise tolerated well No report of concerns or symptoms today Strength training completed today  Goals Unmet:  Not Applicable  Comments: Pt able to follow exercise prescription today without complaint.  Will continue to monitor for progression.    Dr. Oneil Pinal is Medical Director for [facility] Cardiac Rehabilitation.  Dr. Fuad Aleskerov is Medical Director for [facility] Pulmonary Rehabilitation.

## 2023-11-17 ENCOUNTER — Encounter: Admitting: *Deleted

## 2023-11-17 DIAGNOSIS — Z951 Presence of aortocoronary bypass graft: Secondary | ICD-10-CM

## 2023-11-17 DIAGNOSIS — Z48812 Encounter for surgical aftercare following surgery on the circulatory system: Secondary | ICD-10-CM | POA: Diagnosis not present

## 2023-11-17 NOTE — Progress Notes (Signed)
 Daily Session Note  Patient Details  Name: Adam Frost MRN: [id number] Date of Birth: 03/29/1941 Referring Provider:   Flowsheet Row Cardiac Rehab from 09/28/2023 in [facility] Cardiac and Pulmonary Rehab  Referring Provider Ammon Blunt, MD    Encounter Date: 11/17/2023  Check In:  Session Check In - 11/17/23 1102       Check-In   Supervising physician immediately available to respond to emergencies See telemetry face sheet for immediately available ER MD    Location [facility]-Cardiac & Pulmonary Rehab    Staff Present Othel Durand, RN, BSN, CCRP;Maxon Conetta BS, Exercise Physiologist;Jason Elnor RDN,LDN;Joseph Hood RCP,RRT,BSRT;Meredith Tressa RN,BSN    Virtual Visit No    Medication changes reported     No    Warm-up and Cool-down Performed on first and last piece of equipment    Resistance Training Performed Yes    VAD Patient? No    PAD/SET Patient? No      Pain Assessment   Currently in Pain? No/denies             Social History   Tobacco Use  Smoking Status Never  Smokeless Tobacco Never    Goals Met:  Independence with exercise equipment Exercise tolerated well No report of concerns or symptoms today  Goals Unmet:  Not Applicable  Comments: Pt able to follow exercise prescription today without complaint.  Will continue to monitor for progression.    Dr. Oneil Pinal is Medical Director for [facility] Cardiac Rehabilitation.  Dr. Fuad Aleskerov is Medical Director for [facility] Pulmonary Rehabilitation.

## 2023-11-21 ENCOUNTER — Encounter

## 2023-11-21 DIAGNOSIS — Z951 Presence of aortocoronary bypass graft: Secondary | ICD-10-CM

## 2023-11-21 DIAGNOSIS — Z48812 Encounter for surgical aftercare following surgery on the circulatory system: Secondary | ICD-10-CM | POA: Diagnosis not present

## 2023-11-21 NOTE — Progress Notes (Signed)
 Daily Session Note  Patient Details  Name: Adam Frost MRN: [id number] Date of Birth: Oct 23, 1940 Referring Provider:   Flowsheet Row Cardiac Rehab from 09/28/2023 in [facility] Cardiac and Pulmonary Rehab  Referring Provider Ammon Blunt, MD    Encounter Date: 11/21/2023  Check In:  Session Check In - 11/21/23 0909       Check-In   Supervising physician immediately available to respond to emergencies See telemetry face sheet for immediately available ER MD    Location [facility]-Cardiac & Pulmonary Rehab    Staff Present Burnard Davenport RN,BSN,MPA;Maxon Burnell BS, Exercise Physiologist;Joseph [facility] Dyane BS, ACSM CEP, Exercise Physiologist    Virtual Visit No    Medication changes reported     No    Fall or balance concerns reported    No    Warm-up and Cool-down Performed on first and last piece of equipment    Resistance Training Performed Yes    VAD Patient? No    PAD/SET Patient? No      Pain Assessment   Currently in Pain? No/denies             Social History   Tobacco Use  Smoking Status Never  Smokeless Tobacco Never    Goals Met:  Independence with exercise equipment Exercise tolerated well No report of concerns or symptoms today Strength training completed today  Goals Unmet:  Not Applicable  Comments: Pt able to follow exercise prescription today without complaint.  Will continue to monitor for progression.    Dr. Oneil Pinal is Medical Director for [facility] Cardiac Rehabilitation.  Dr. Fuad Aleskerov is Medical Director for [facility] Pulmonary Rehabilitation.

## 2023-11-23 ENCOUNTER — Encounter

## 2023-11-23 VITALS — Ht 71.1 in | Wt 170.0 lb

## 2023-11-23 DIAGNOSIS — Z48812 Encounter for surgical aftercare following surgery on the circulatory system: Secondary | ICD-10-CM | POA: Diagnosis not present

## 2023-11-23 DIAGNOSIS — Z951 Presence of aortocoronary bypass graft: Secondary | ICD-10-CM

## 2023-11-23 NOTE — Patient Instructions (Signed)
 Discharge Patient Instructions  Patient Details  Name: Adam Frost MRN: [id number] Date of Birth: 02/19/1941 Referring Provider:  Diedra Lame, MD   Number of Visits: 4  Reason for Discharge:  Patient reached a stable level of exercise. Patient independent in their exercise. Patient has met program and personal goals.  Diagnosis:  S/P CABG x 3  Initial Exercise Prescription:  Initial Exercise Prescription - 09/28/23 1000       Date of Initial Exercise RX and Referring Provider   Date 09/28/23    Referring Provider Ammon Blunt, MD      Oxygen   Maintain Oxygen Saturation 88% or higher      Treadmill   MPH 1.4    Grade 0    Minutes 15    METs 2.07      Recumbant Bike   Level 2    RPM 50    Watts 25    Minutes 15    METs 2.03      NuStep   Level 2   T4 and T6   SPM 80    Minutes 15    METs 2.03      T5 Nustep   Level 2    SPM 80    Minutes 15    METs 2.03      Track   Laps 22    Minutes 15    METs 2.2      Prescription Details   Frequency (times per week) 3    Duration Progress to 30 minutes of continuous aerobic without signs/symptoms of physical distress      Intensity   THRR 40-80% of Max Heartrate 85-119    Ratings of Perceived Exertion 11-13    Perceived Dyspnea 0-4      Progression   Progression Continue to progress workloads to maintain intensity without signs/symptoms of physical distress.      Resistance Training   Training Prescription Yes    Weight 3 lb    Reps 10-15          Discharge Exercise Prescription (Final Exercise Prescription Changes):  Exercise Prescription Changes - 11/16/23 1100       Response to Exercise   Blood Pressure (Admit) 114/68    Blood Pressure (Exit) 124/58    Heart Rate (Admit) 80 bpm    Heart Rate (Exercise) 99 bpm    Heart Rate (Exit) 57 bpm    Rating of Perceived Exertion (Exercise) 14    Symptoms none    Duration Continue with 30 min of aerobic exercise without signs/symptoms  of physical distress.    Intensity THRR unchanged      Progression   Progression Continue to progress workloads to maintain intensity without signs/symptoms of physical distress.    Average METs 3.34      Resistance Training   Training Prescription Yes    Weight 3 lb    Reps 10-15      Interval Training   Interval Training No      Treadmill   MPH 3    Grade 0    Minutes 15    METs 3.3      Recumbant Bike   Level 4    Watts 30    Minutes 15    METs 3.26      NuStep   Level 6    Minutes 15    METs 6.4      T5 Nustep   Level 4   T5 and T6  Minutes 15    METs 3.2      Home Exercise Plan   Plans to continue exercise at Home (comment)   walk and use hand weights   Frequency Add 2 additional days to program exercise sessions.    Initial Home Exercises Provided 11/07/23      Oxygen   Maintain Oxygen Saturation 88% or higher          Functional Capacity:  6 Minute Walk     Row Name 09/28/23 1044 11/23/23 0934       6 Minute Walk   Phase Initial Discharge    Distance 1140 feet 1400 feet    Distance % Change -- 22.8 %    Distance Feet Change -- 260 ft    Walk Time 6 minutes 6 minutes    # of Rest Breaks 0 0    MPH 2.16 2.65    METS 2.03 2.48    RPE 13 12    Perceived Dyspnea  0 1    VO2 Peak 7.09 8.67    Symptoms No No    Resting HR 52 bpm 51 bpm    Resting BP 140/60 144/64    Resting Oxygen Saturation  98 % 98 %    Exercise Oxygen Saturation  during 6 min walk 97 % 98 %    Max Ex. HR 72 bpm 75 bpm    Max Ex. BP 144/60 148/64    2 Minute Post BP 100/60 --       Nutrition & Weight - Outcomes:  Pre Biometrics - 09/28/23 1050       Pre Biometrics   Height 5' 11.1 (1.806 m)    Weight 163 lb 14.4 oz (74.3 kg)    Waist Circumference 39.4 inches    Hip Circumference 38.5 inches    Waist to Hip Ratio 1.02 %    BMI (Calculated) 22.79    Single Leg Stand 4.3 seconds          Post Biometrics - 11/23/23 0938        Post  Biometrics   Height  5' 11.1 (1.806 m)    Weight 170 lb (77.1 kg)    Waist Circumference 35.5 inches    Hip Circumference 38 inches    Waist to Hip Ratio 0.93 %    BMI (Calculated) 23.64    Single Leg Stand 5.9 seconds          Nutrition:  Nutrition Therapy & Goals - 09/28/23 1052       Personal Nutrition Goals   Nutrition Goal Wants to think about scheduling appointment with RD in the coming weeks      Intervention Plan   Intervention Prescribe, educate and counsel regarding individualized specific dietary modifications aiming towards targeted core components such as weight, hypertension, lipid management, diabetes, heart failure and other comorbidities.    Expected Outcomes Short Term Goal: Understand basic principles of dietary content, such as calories, fat, sodium, cholesterol and nutrients.

## 2023-11-23 NOTE — Progress Notes (Signed)
 Daily Session Note  Patient Details  Name: Adam Frost MRN: [id number] Date of Birth: April 16, 1941 Referring Provider:   Flowsheet Row Cardiac Rehab from 09/28/2023 in [facility] Cardiac and Pulmonary Rehab  Referring Provider Ammon Blunt, MD    Encounter Date: 11/23/2023  Check In:  Session Check In - 11/23/23 9072       Check-In   Supervising physician immediately available to respond to emergencies See telemetry face sheet for immediately available ER MD    Location [facility]-Cardiac & Pulmonary Rehab    Staff Present Burnard Davenport RN,BSN,MPA;Joseph [facility] RCP,RRT,BSRT;Margaret Best, MS, Exercise Physiologist;Jason Elnor RDN,LDN;Noah Tickle, BS, Exercise Physiologist    Virtual Visit No    Medication changes reported     No    Fall or balance concerns reported    No    Warm-up and Cool-down Performed on first and last piece of equipment    Resistance Training Performed Yes    VAD Patient? No    PAD/SET Patient? No      Pain Assessment   Currently in Pain? No/denies             Social History   Tobacco Use  Smoking Status Never  Smokeless Tobacco Never    Goals Met:  Independence with exercise equipment Exercise tolerated well No report of concerns or symptoms today Strength training completed today  Goals Unmet:  Not Applicable  Comments: Pt able to follow exercise prescription today without complaint.  Will continue to monitor for progression.    Dr. Oneil Pinal is Medical Director for [facility] Cardiac Rehabilitation.  Dr. Fuad Aleskerov is Medical Director for [facility] Pulmonary Rehabilitation.

## 2023-11-24 ENCOUNTER — Encounter

## 2023-11-24 DIAGNOSIS — Z48812 Encounter for surgical aftercare following surgery on the circulatory system: Secondary | ICD-10-CM | POA: Diagnosis not present

## 2023-11-24 DIAGNOSIS — Z951 Presence of aortocoronary bypass graft: Secondary | ICD-10-CM

## 2023-11-24 NOTE — Progress Notes (Signed)
 Daily Session Note  Patient Details  Name: Adam Frost MRN: [id number] Date of Birth: Jul 14, 1940 Referring Provider:   Flowsheet Row Cardiac Rehab from 09/28/2023 in [facility] Cardiac and Pulmonary Rehab  Referring Provider Ammon Blunt, MD    Encounter Date: 11/24/2023  Check In:  Session Check In - 11/24/23 1106       Check-In   Supervising physician immediately available to respond to emergencies See telemetry face sheet for immediately available ER MD    Location [facility]-Cardiac & Pulmonary Rehab    Staff Present Burnard Davenport RN,BSN,MPA;Maxon Conetta BS, Exercise Physiologist;Kristen Coble RN,BC,MSN;Margaret Best, MS, Exercise Physiologist    Virtual Visit No    Medication changes reported     No    Fall or balance concerns reported    No    Warm-up and Cool-down Performed on first and last piece of equipment    Resistance Training Performed Yes    VAD Patient? No    PAD/SET Patient? No      Pain Assessment   Currently in Pain? No/denies             Social History   Tobacco Use  Smoking Status Never  Smokeless Tobacco Never    Goals Met:  Independence with exercise equipment Exercise tolerated well No report of concerns or symptoms today Strength training completed today  Goals Unmet:  Not Applicable  Comments: Pt able to follow exercise prescription today without complaint.  Will continue to monitor for progression.    Dr. Oneil Pinal is Medical Director for [facility] Cardiac Rehabilitation.  Dr. Fuad Aleskerov is Medical Director for [facility] Pulmonary Rehabilitation.

## 2023-11-28 ENCOUNTER — Encounter

## 2023-11-28 DIAGNOSIS — Z48812 Encounter for surgical aftercare following surgery on the circulatory system: Secondary | ICD-10-CM | POA: Diagnosis not present

## 2023-11-28 DIAGNOSIS — Z951 Presence of aortocoronary bypass graft: Secondary | ICD-10-CM

## 2023-11-28 NOTE — Progress Notes (Signed)
 Daily Session Note  Patient Details  Name: Adam Frost MRN: [id number] Date of Birth: Jan 20, 1941 Referring Provider:   Flowsheet Row Cardiac Rehab from 09/28/2023 in [facility] Cardiac and Pulmonary Rehab  Referring Provider Ammon Blunt, MD    Encounter Date: 11/28/2023  Check In:  Session Check In - 11/28/23 0908       Check-In   Supervising physician immediately available to respond to emergencies See telemetry face sheet for immediately available ER MD    Location [facility]-Cardiac & Pulmonary Rehab    Staff Present Burnard Davenport RN,BSN,MPA;Maxon Burnell BS, Exercise Physiologist;Joseph [facility] Dyane BS, ACSM CEP, Exercise Physiologist    Virtual Visit No    Medication changes reported     No    Fall or balance concerns reported    No    Warm-up and Cool-down Performed on first and last piece of equipment    Resistance Training Performed Yes    VAD Patient? No    PAD/SET Patient? No      Pain Assessment   Currently in Pain? No/denies             Social History   Tobacco Use  Smoking Status Never  Smokeless Tobacco Never    Goals Met:  Independence with exercise equipment Exercise tolerated well No report of concerns or symptoms today Strength training completed today  Goals Unmet:  Not Applicable  Comments: Pt able to follow exercise prescription today without complaint.  Will continue to monitor for progression.    Dr. Oneil Pinal is Medical Director for [facility] Cardiac Rehabilitation.  Dr. Fuad Aleskerov is Medical Director for [facility] Pulmonary Rehabilitation.

## 2023-11-30 ENCOUNTER — Encounter

## 2023-11-30 DIAGNOSIS — Z951 Presence of aortocoronary bypass graft: Secondary | ICD-10-CM

## 2023-11-30 DIAGNOSIS — Z48812 Encounter for surgical aftercare following surgery on the circulatory system: Secondary | ICD-10-CM | POA: Diagnosis not present

## 2023-11-30 NOTE — Progress Notes (Signed)
 Cardiac Individual Treatment Plan  Patient Details  Name: Adam Frost MRN: [id number] Date of Birth: 09/22/1940 Referring Provider:   Flowsheet Row Cardiac Rehab from 09/28/2023 in [facility] Cardiac and Pulmonary Rehab  Referring Provider Ammon Blunt, MD    Initial Encounter Date:  Flowsheet Row Cardiac Rehab from 09/28/2023 in [facility] Cardiac and Pulmonary Rehab  Date 09/28/23    Visit Diagnosis: S/P CABG x 3  Patient's Home Medications on Admission:  Current Outpatient Medications:    aspirin  EC 325 MG tablet, Take 1 tablet (325 mg total) by mouth daily., Disp: , Rfl:    atorvastatin  (LIPITOR) 40 MG tablet, Take 1 tablet (40 mg total) by mouth daily., Disp: 90 tablet, Rfl: 4   glipiZIDE  (GLUCOTROL  XL) 5 MG 24 hr tablet, Take 5 mg by mouth daily with breakfast., Disp: , Rfl:    hydrochlorothiazide  (HYDRODIURIL ) 12.5 MG tablet, Take 12.5 mg by mouth daily., Disp: , Rfl:    lisinopril (ZESTRIL) 5 MG tablet, Take 5 mg by mouth daily., Disp: , Rfl:    metformin  (FORTAMET ) 1000 MG (OSM) 24 hr tablet, Take 1,000 mg by mouth daily with breakfast., Disp: , Rfl:    metoprolol  succinate (TOPROL -XL) 25 MG 24 hr tablet, Take 25 mg by mouth daily., Disp: , Rfl:    OVER THE COUNTER MEDICATION, Take 2 tablets by mouth daily. VitaFusion - Omega 3/EPA/DHEA/ALA, Disp: , Rfl:    Pediatric Multiple Vitamins (FLINSTONES GUMMIES OMEGA-3 DHA) CHEW, Chew 2 tablets by mouth daily., Disp: , Rfl:    traMADol  (ULTRAM ) 50 MG tablet, Take 1 tablet (50 mg total) by mouth every 6 (six) hours as needed for moderate pain (pain score 4-6). 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   Coronary artery disease    Diabetes  mellitus without complication (HCC)    Hemorrhoid    Hyperlipidemia    Hypertension     Tobacco Use: Social History   Tobacco Use  Smoking Status Never  Smokeless Tobacco Never    Labs: Review Flowsheet  More data exists      Latest Ref Rng & Units 10/11/2017 04/13/2018 10/10/2018 08/05/2023 08/08/2023  Labs for ITP Cardiac and Pulmonary Rehab  Cholestrol 100 - 199 mg/dL 844  835  - - -  LDL (calc) 0 - 99 mg/dL - 898  - - -  HDL-C >60 mg/dL - 49  - - -  Trlycerides 0 - 149 mg/dL 63  72  - - -  Hemoglobin A1c 4.8 - 5.6 % 6.3  6.3  6.5  6.8  -  PH, Arterial 7.35 - 7.45 - - - - 7.354  7.399  7.390  7.399  7.427  7.302  7.385  7.46   PCO2 arterial 32 - 48 mmHg - - - - 35.9  35.6  36.0  39.1  36.5  43.1  30.1  25   Bicarbonate 20.0 - 28.0 mmol/L - - - - 20.4  22.4  22.1  24.1  24.0  35.2  21.3  18.0  17.8   TCO2 22 - 32 mmol/L - - - - 22  24  23  25  25   25  23  37  23  21  19  21    Acid-base deficit 0.0 - 2.0 mmol/L - - - - 5.0  3.0  3.0  1.0  5.0  6.0  4.4   O2 Saturation % - - - - 99  99  98  100  100  86  100  100  99.9     Details       Multiple values from one day are sorted in reverse-chronological order          Exercise Target Goals: Exercise Program Goal: Individual exercise prescription set using results from initial 6 min walk test and THRR while considering  patient's activity barriers and safety.   Exercise Prescription Goal: Initial exercise prescription builds to 30-45 minutes a day of aerobic activity, 2-3 days per week.  Home exercise guidelines will be given to patient during program as part of exercise prescription that the participant will acknowledge.   Education: Aerobic Exercise: - Group verbal and visual presentation on the components of exercise prescription. Introduces F.I.T.T principle from ACSM for exercise prescriptions.  Reviews F.I.T.T. principles of aerobic exercise including progression. Written material given at graduation.   Education:  Resistance Exercise: - Group verbal and visual presentation on the components of exercise prescription. Introduces F.I.T.T principle from ACSM for exercise prescriptions  Reviews F.I.T.T. principles of resistance exercise including progression. Written material given at graduation.    Education: Exercise & Equipment Safety: - Individual verbal instruction and demonstration of equipment use and safety with use of the equipment. Flowsheet Row Cardiac Rehab from 11/24/2023 in [facility] Cardiac and Pulmonary Rehab  Date 09/28/23  Educator MB  Instruction Review Code 1- Verbalizes Understanding    Education: Exercise Physiology & General Exercise Guidelines: - Group verbal and written instruction with models to review the exercise physiology of the cardiovascular system and associated critical values. Provides general exercise guidelines with specific guidelines to those with heart or lung disease.  Flowsheet Row Cardiac Rehab from 11/24/2023 in [facility] Cardiac and Pulmonary Rehab  Date 11/23/23  Educator nt  Instruction Review Code 1- TEFL teacher Understanding    Education: Flexibility, Balance, Mind/Body Relaxation: - Group verbal and visual presentation with interactive activity on the components of exercise prescription. Introduces F.I.T.T principle from ACSM for exercise prescriptions. Reviews F.I.T.T. principles of flexibility and balance exercise training including progression. Also discusses the mind body connection.  Reviews various relaxation techniques to help reduce and manage stress (i.e. Deep breathing, progressive muscle relaxation, and visualization). Balance handout provided to take home. Written material given at graduation.   Activity Barriers & Risk Stratification:  Activity Barriers & Cardiac Risk Stratification - 09/28/23 1045       Activity Barriers & Cardiac Risk Stratification   Activity Barriers Back Problems;Assistive Device;Other (comment)    Comments Lifting restriction of  no more than 10 lbs until 11/07/2023. Wears a back brace and uses a cane in public    Cardiac Risk Stratification High          6 Minute Walk:  6 Minute Walk     Row Name 09/28/23 1044 11/23/23 0934       6 Minute Walk   Phase Initial Discharge    Distance 1140 feet 1400 feet    Distance % Change -- 22.8 %    Distance Feet Change -- 260 ft    Walk Time 6 minutes 6 minutes    # of Rest Breaks 0 0    MPH 2.16 2.65  METS 2.03 2.48    RPE 13 12    Perceived Dyspnea  0 1    VO2 Peak 7.09 8.67    Symptoms No No    Resting HR 52 bpm 51 bpm    Resting BP 140/60 144/64    Resting Oxygen Saturation  98 % 98 %    Exercise Oxygen Saturation  during 6 min walk 97 % 98 %    Max Ex. HR 72 bpm 75 bpm    Max Ex. BP 144/60 148/64    2 Minute Post BP 100/60 --       Oxygen Initial Assessment:   Oxygen Re-Evaluation:   Oxygen Discharge (Final Oxygen Re-Evaluation):   Initial Exercise Prescription:  Initial Exercise Prescription - 09/28/23 1000       Date of Initial Exercise RX and Referring Provider   Date 09/28/23    Referring Provider Ammon Blunt, MD      Oxygen   Maintain Oxygen Saturation 88% or higher      Treadmill   MPH 1.4    Grade 0    Minutes 15    METs 2.07      Recumbant Bike   Level 2    RPM 50    Watts 25    Minutes 15    METs 2.03      NuStep   Level 2   T4 and T6   SPM 80    Minutes 15    METs 2.03      T5 Nustep   Level 2    SPM 80    Minutes 15    METs 2.03      Track   Laps 22    Minutes 15    METs 2.2      Prescription Details   Frequency (times per week) 3    Duration Progress to 30 minutes of continuous aerobic without signs/symptoms of physical distress      Intensity   THRR 40-80% of Max Heartrate 85-119    Ratings of Perceived Exertion 11-13    Perceived Dyspnea 0-4      Progression   Progression Continue to progress workloads to maintain intensity without signs/symptoms of physical distress.       Resistance Training   Training Prescription Yes    Weight 3 lb    Reps 10-15          Perform Capillary Blood Glucose checks as needed.  Exercise Prescription Changes:   Exercise Prescription Changes     Row Name 09/28/23 1000 10/18/23 1000 11/03/23 1500 11/07/23 0900 11/16/23 1100     Response to Exercise   Blood Pressure (Admit) 140/60 132/74 128/76 -- 114/68   Blood Pressure (Exercise) 144/60 132/67 -- -- --   Blood Pressure (Exit) 100/60 104/64 98/60 -- 124/58   Heart Rate (Admit) 52 bpm 68 bpm 72 bpm -- 80 bpm   Heart Rate (Exercise) 72 bpm 100 bpm 97 bpm -- 99 bpm   Heart Rate (Exit) 56 bpm 60 bpm 59 bpm -- 57 bpm   Oxygen Saturation (Admit) 98 % -- -- -- --   Oxygen Saturation (Exercise) 97 % -- -- -- --   Oxygen Saturation (Exit) 98 % -- -- -- --   Rating of Perceived Exertion (Exercise) 13 -- 15 -- 14   Perceived Dyspnea (Exercise) 0 -- -- -- --   Symptoms none none none -- none   Comments results First 2 weeks of exercise sessions -- -- --  Duration -- Continue with 30 min of aerobic exercise without signs/symptoms of physical distress. Continue with 30 min of aerobic exercise without signs/symptoms of physical distress. Continue with 30 min of aerobic exercise without signs/symptoms of physical distress. Continue with 30 min of aerobic exercise without signs/symptoms of physical distress.   Intensity THRR New THRR unchanged THRR unchanged THRR unchanged THRR unchanged     Progression   Progression -- Continue to progress workloads to maintain intensity without signs/symptoms of physical distress. Continue to progress workloads to maintain intensity without signs/symptoms of physical distress. Continue to progress workloads to maintain intensity without signs/symptoms of physical distress. Continue to progress workloads to maintain intensity without signs/symptoms of physical distress.   Average METs 2.03 2.82 3.07 3.07 3.34     Resistance Training   Training  Prescription -- Yes Yes Yes Yes   Weight -- 3 lb 3 lb 3 lb 3 lb   Reps -- 10-15 10-15 10-15 10-15     Interval Training   Interval Training -- No No No No     Treadmill   MPH -- 3 3 3 3    Grade -- 0 0 0 0   Minutes -- 15 15 15 15    METs -- 3.3 3.3 3.3 3.3     Recumbant Bike   Level -- 2 4 4 4    Watts -- 19 30 30 30    Minutes -- 15 15 15 15    METs -- 3.06 3.26 3.26 3.26     NuStep   Level -- 4  T6 5  T6: 4 5  T6: 4 6   Minutes -- 15 15 15 15    METs -- 2.2 4.7  T6: 2.5 4.7  T6: 2.5 6.4     T5 Nustep   Level -- 1 4 4 4   T5 and T6   Minutes -- 15 15 15 15    METs -- 2.1 2.5 2.5 3.2     Home Exercise Plan   Plans to continue exercise at -- -- -- Home (comment)  walk and use hand weights Home (comment)  walk and use hand weights   Frequency -- -- -- Add 2 additional days to program exercise sessions. Add 2 additional days to program exercise sessions.   Initial Home Exercises Provided -- -- -- 11/07/23 11/07/23     Oxygen   Maintain Oxygen Saturation -- 88% or higher 88% or higher 88% or higher 88% or higher    Row Name 11/29/23 1300             Response to Exercise   Blood Pressure (Admit) 146/58       Blood Pressure (Exercise) 138/52       Blood Pressure (Exit) 138/60       Heart Rate (Admit) 63 bpm       Heart Rate (Exercise) 92 bpm       Heart Rate (Exit) 59 bpm       Oxygen Saturation (Admit) 98 %       Oxygen Saturation (Exercise) 95 %       Oxygen Saturation (Exit) 95 %       Rating of Perceived Exertion (Exercise) 14       Symptoms none       Duration Continue with 30 min of aerobic exercise without signs/symptoms of physical distress.       Intensity THRR unchanged         Progression   Progression Continue to progress workloads to  maintain intensity without signs/symptoms of physical distress.       Average METs 3.43         Resistance Training   Training Prescription Yes       Weight 3 lb       Reps 10-15         Interval Training   Interval  Training No         Treadmill   MPH 3.1       Grade 0       Minutes 15       METs 3.37         Recumbant Bike   Level 5       Watts 37       Minutes 15       METs 3.22         NuStep   Level 5       Minutes 15       METs 4.4         T5 Nustep   Level 5  T5 and T6       Minutes 15       METs 2.9         Home Exercise Plan   Plans to continue exercise at Home (comment)  walk and use hand weights       Frequency Add 2 additional days to program exercise sessions.       Initial Home Exercises Provided 11/07/23         Oxygen   Maintain Oxygen Saturation 88% or higher          Exercise Comments:   Exercise Comments     Row Name 10/05/23 0946           Exercise Comments First full day of exercise!  Patient was oriented to gym and equipment including functions, settings, policies, and procedures.  Patient's individual exercise prescription and treatment plan were reviewed.  All starting workloads were established based on the results of the 6 minute walk test done at initial orientation visit.  The plan for exercise progression was also introduced and progression will be customized based on patient's performance and goals.          Exercise Goals and Review:   Exercise Goals     Row Name 09/28/23 1049             Exercise Goals   Increase Physical Activity Yes       Intervention Provide advice, education, support and counseling about physical activity/exercise needs.;Develop an individualized exercise prescription for aerobic and resistive training based on initial evaluation findings, risk stratification, comorbidities and participant's personal goals.       Expected Outcomes Short Term: Attend rehab on a regular basis to increase amount of physical activity.;Long Term: Add in home exercise to make exercise part of routine and to increase amount of physical activity.;Long Term: Exercising regularly at least 3-5 days a week.       Increase Strength and Stamina  Yes       Intervention Provide advice, education, support and counseling about physical activity/exercise needs.;Develop an individualized exercise prescription for aerobic and resistive training based on initial evaluation findings, risk stratification, comorbidities and participant's personal goals.       Expected Outcomes Short Term: Increase workloads from initial exercise prescription for resistance, speed, and METs.;Short Term: Perform resistance training exercises routinely during rehab and add in resistance training at home;Long Term: Improve cardiorespiratory fitness, muscular endurance  and strength as measured by increased METs and functional capacity ( )       Able to understand and use rate of perceived exertion (RPE) scale Yes       Intervention Provide education and explanation on how to use RPE scale       Expected Outcomes Short Term: Able to use RPE daily in rehab to express subjective intensity level;Long Term:  Able to use RPE to guide intensity level when exercising independently       Able to understand and use Dyspnea scale Yes       Intervention Provide education and explanation on how to use Dyspnea scale       Expected Outcomes Short Term: Able to use Dyspnea scale daily in rehab to express subjective sense of shortness of breath during exertion;Long Term: Able to use Dyspnea scale to guide intensity level when exercising independently       Knowledge and understanding of Target Heart Rate Range (THRR) Yes       Intervention Provide education and explanation of THRR including how the numbers were predicted and where they are located for reference       Expected Outcomes Short Term: Able to state/look up THRR;Short Term: Able to use daily as guideline for intensity in rehab;Long Term: Able to use THRR to govern intensity when exercising independently       Able to check pulse independently Yes       Intervention Provide education and demonstration on how to check pulse in  carotid and radial arteries.;Review the importance of being able to check your own pulse for safety during independent exercise       Expected Outcomes Short Term: Able to explain why pulse checking is important during independent exercise;Long Term: Able to check pulse independently and accurately       Understanding of Exercise Prescription Yes       Intervention Provide education, explanation, and written materials on patient's individual exercise prescription       Expected Outcomes Short Term: Able to explain program exercise prescription;Long Term: Able to explain home exercise prescription to exercise independently          Exercise Goals Re-Evaluation :  Exercise Goals Re-Evaluation     Row Name 10/05/23 1115 10/18/23 1049 10/26/23 0922 11/03/23 1504 11/07/23 0947     Exercise Goal Re-Evaluation   Exercise Goals Review Increase Physical Activity;Able to understand and use rate of perceived exertion (RPE) scale;Knowledge and understanding of Target Heart Rate Range (THRR);Understanding of Exercise Prescription;Increase Strength and Stamina;Able to check pulse independently Increase Physical Activity;Understanding of Exercise Prescription;Increase Strength and Stamina Increase Physical Activity;Increase Strength and Stamina;Understanding of Exercise Prescription Increase Physical Activity;Increase Strength and Stamina;Understanding of Exercise Prescription Increase Physical Activity;Able to understand and use rate of perceived exertion (RPE) scale;Knowledge and understanding of Target Heart Rate Range (THRR);Understanding of Exercise Prescription;Increase Strength and Stamina;Able to understand and use Dyspnea scale;Able to check pulse independently   Comments Reviewed RPE and dyspnea scale, THR and program prescription with pt today.  Pt voiced understanding and was given a copy of goals to take home. Ora is off to a good start in the program and he completed his first 2 weeks of exercise  sessions in this review. He tolerated his exercise prescription well. HE was able to increase his workload on the treadmill to a speed of 3 mph with no incline. He also already increased to level 4 on the T6 nustep. He did well with level 2  on the recumbent bike and level 1 on the T5 nustep. We will continue to monitor his progress in the program. Kirill is doing well at rehab, he reports his legs and arms are weak but he still comes because he knows this will help them become stronger. He walks a mile at least one day of the week. But it has been very hot lately. Lyndal is doing well in rehab. He has continued to walk on the treadmill at a speed of 3 mph with no incline. He also improved to level 5 on the T4 nustep machine and level 5 on the T5 nustep machine. We will continue to monitor his progress in the program. Reviewed home exercise with pt today.  Pt plans to walk and use hand weights for exercise.  Reviewed THR, pulse, RPE, sign and symptoms, pulse oximetery and when to call 911 or MD.  Also discussed weather considerations and indoor options.  Pt voiced understanding.   Expected Outcomes Short: Use RPE daily to regulate intensity. Long: Follow program prescription in THR. Short: Continue to follow current exercise prescription. Long: Continue exercise to improve strength and stamina. STG: Continue to attend rehab. LTG: Continue exercise to improve strength and stamina. Short: Continue to progressively increase treadmill workload. Long: Continue exercise to improve strength and stamina. Short: add 1-2 days a week of exercise at home on off days of cardiac rehab. Long: maintain independent exercise routine upon graduation from cardiac rehab.    Row Name 11/09/23 0932 11/16/23 1147 11/29/23 1338         Exercise Goal Re-Evaluation   Exercise Goals Review Increase Physical Activity;Increase Strength and Stamina;Understanding of Exercise Prescription Increase Physical Activity;Increase Strength and  Stamina;Understanding of Exercise Prescription Increase Physical Activity;Increase Strength and Stamina;Understanding of Exercise Prescription     Comments Tytus is doing well here at rehab, he is not doing much at home, walks some. He plans to do more once he finishes rehab. Cranford continues to do well in rehab. He is due for his post soon and hopes to improve. He increased to level 6 on the T4 nustep. He maintained level 4 on the T5 and T6 nusteps and recumbent bike. He also maintained his workload on the treadmill with a speed of 3 mph and no incline. We will continue to monitor his progress in the program. Kaiea continues to do well in rehab and will graduate soon. He completed his post and improved by 22.8% with 1434ft. He increased his speed on the treadmill to 3.1 mph with no incline. He also increased to level 5 on the recumbent bike and T6 nustep. We will continue to monitor his progress in the program until graduation.     Expected Outcomes Short: add 1-2 days a week of exercise at home on off days of cardiac rehab. Long: maintain independent exercise routine upon graduation from cardiac rehab. Short: Improve on post . Long: Continue to increase overall METs and stamina. Short: Graduate. Long: Continue to exercise independently.        Discharge Exercise Prescription (Final Exercise Prescription Changes):  Exercise Prescription Changes - 11/29/23 1300       Response to Exercise   Blood Pressure (Admit) 146/58    Blood Pressure (Exercise) 138/52    Blood Pressure (Exit) 138/60    Heart Rate (Admit) 63 bpm    Heart Rate (Exercise) 92 bpm    Heart Rate (Exit) 59 bpm    Oxygen Saturation (Admit) 98 %  Oxygen Saturation (Exercise) 95 %    Oxygen Saturation (Exit) 95 %    Rating of Perceived Exertion (Exercise) 14    Symptoms none    Duration Continue with 30 min of aerobic exercise without signs/symptoms of physical distress.    Intensity THRR unchanged       Progression   Progression Continue to progress workloads to maintain intensity without signs/symptoms of physical distress.    Average METs 3.43      Resistance Training   Training Prescription Yes    Weight 3 lb    Reps 10-15      Interval Training   Interval Training No      Treadmill   MPH 3.1    Grade 0    Minutes 15    METs 3.37      Recumbant Bike   Level 5    Watts 37    Minutes 15    METs 3.22      NuStep   Level 5    Minutes 15    METs 4.4      T5 Nustep   Level 5   T5 and T6   Minutes 15    METs 2.9      Home Exercise Plan   Plans to continue exercise at Home (comment)   walk and use hand weights   Frequency Add 2 additional days to program exercise sessions.    Initial Home Exercises Provided 11/07/23      Oxygen   Maintain Oxygen Saturation 88% or higher          Nutrition:  Target Goals: Understanding of nutrition guidelines, daily intake of sodium 1500mg , cholesterol 200mg , calories 30% from fat and 7% or less from saturated fats, daily to have 5 or more servings of fruits and vegetables.  Education: All About Nutrition: -Group instruction provided by verbal, written material, interactive activities, discussions, models, and posters to present general guidelines for heart healthy nutrition including fat, fiber, MyPlate, the role of sodium in heart healthy nutrition, utilization of the nutrition label, and utilization of this knowledge for meal planning. Follow up email sent as well. Written material given at graduation. Flowsheet Row Cardiac Rehab from 11/24/2023 in [facility] Cardiac and Pulmonary Rehab  Education need identified 09/28/23  Date 10/12/23  Educator jg part 2  Instruction Review Code 1- Verbalizes Understanding    Biometrics:  Pre Biometrics - 09/28/23 1050       Pre Biometrics   Height 5' 11.1 (1.806 m)    Weight 163 lb 14.4 oz (74.3 kg)    Waist Circumference 39.4 inches    Hip Circumference 38.5 inches    Waist to Hip  Ratio 1.02 %    BMI (Calculated) 22.79    Single Leg Stand 4.3 seconds          Post Biometrics - 11/23/23 9061        Post  Biometrics   Height 5' 11.1 (1.806 m)    Weight 170 lb (77.1 kg)    Waist Circumference 35.5 inches    Hip Circumference 38 inches    Waist to Hip Ratio 0.93 %    BMI (Calculated) 23.64    Single Leg Stand 5.9 seconds          Nutrition Therapy Plan and Nutrition Goals:  Nutrition Therapy & Goals - 09/28/23 1052       Personal Nutrition Goals   Nutrition Goal Wants to think about scheduling appointment with RD in the  coming weeks      Intervention Plan   Intervention Prescribe, educate and counsel regarding individualized specific dietary modifications aiming towards targeted core components such as weight, hypertension, lipid management, diabetes, heart failure and other comorbidities.    Expected Outcomes Short Term Goal: Understand basic principles of dietary content, such as calories, fat, sodium, cholesterol and nutrients.          Nutrition Assessments:  MEDIFICTS Score Key: >=70 Need to make dietary changes  40-70 Heart Healthy Diet <= 40 Therapeutic Level Cholesterol Diet  Flowsheet Row Cardiac Rehab from 09/28/2023 in [facility] Cardiac and Pulmonary Rehab  Picture Your Plate Total Score on Admission 55   Picture Your Plate Scores: <59 Unhealthy dietary pattern with much room for improvement. 41-50 Dietary pattern unlikely to meet recommendations for good health and room for improvement. 51-60 More healthful dietary pattern, with some room for improvement.  >60 Healthy dietary pattern, although there may be some specific behaviors that could be improved.    Nutrition Goals Re-Evaluation:  Nutrition Goals Re-Evaluation     Row Name 10/26/23 0931 11/09/23 0934           Goals   Comment Gianny reports he is trying to eat heart healthy. reports he is monitoring his salt intake and making sure to drink enough water throughout the  day Jehiel says he is watching his salt intake and drinking ~48-64oz of water most days.      Expected Outcome STG: read facts labels and stay below 1500mg  Sodium and drink 48-64oz of water daily. LTG: Follow a heart healthy lifestyle STG: read facts labels and stay below 1500mg  Sodium and drink 48-64oz of water daily. LTG: Follow a heart healthy lifestyle         Nutrition Goals Discharge (Final Nutrition Goals Re-Evaluation):  Nutrition Goals Re-Evaluation - 11/09/23 0934       Goals   Comment Toluwani says he is watching his salt intake and drinking ~48-64oz of water most days.    Expected Outcome STG: read facts labels and stay below 1500mg  Sodium and drink 48-64oz of water daily. LTG: Follow a heart healthy lifestyle          Psychosocial: Target Goals: Acknowledge presence or absence of significant depression and/or stress, maximize coping skills, provide positive support system. Participant is able to verbalize types and ability to use techniques and skills needed for reducing stress and depression.   Education: Stress, Anxiety, and Depression - Group verbal and visual presentation to define topics covered.  Reviews how body is impacted by stress, anxiety, and depression.  Also discusses healthy ways to reduce stress and to treat/manage anxiety and depression.  Written material given at graduation. Flowsheet Row Cardiac Rehab from 11/24/2023 in [facility] Cardiac and Pulmonary Rehab  Date 11/16/23  Educator sb  Instruction Review Code 1- [employer] Understanding    Education: Sleep Hygiene -Provides group verbal and written instruction about how sleep can affect your health.  Define sleep hygiene, discuss sleep cycles and impact of sleep habits. Review good sleep hygiene tips.    Initial Review & Psychosocial Screening:  Initial Psych Review & Screening - 09/22/23 1343       Initial Review   Current issues with None Identified      Family Dynamics   Good Support System? Yes    wife, has problems off and on, daughter close by     Barriers   Psychosocial barriers to participate in program There are no identifiable barriers or psychosocial  needs.      Screening Interventions   Interventions To provide support and resources with identified psychosocial needs;Provide feedback about the scores to participant    Expected Outcomes Short Term goal: Utilizing psychosocial counselor, staff and physician to assist with identification of specific Stressors or current issues interfering with healing process. Setting desired goal for each stressor or current issue identified.;Long Term Goal: Stressors or current issues are controlled or eliminated.;Short Term goal: Identification and review with participant of any Quality of Life or Depression concerns found by scoring the questionnaire.;Long Term goal: The participant improves quality of Life and PHQ9 Scores as seen by post scores and/or verbalization of changes          Quality of Life Scores:   Quality of Life - 09/28/23 1051       Quality of Life   Select Quality of Life      Quality of Life Scores   Health/Function Pre 22.77 %    Socioeconomic Pre 21.75 %    Psych/Spiritual Pre 23.57 %    Family Pre 22.5 %    GLOBAL Pre 22.66 %         Scores of 19 and below usually indicate a poorer quality of life in these areas.  A difference of  2-3 points is a clinically meaningful difference.  A difference of 2-3 points in the total score of the Quality of Life Index has been associated with significant improvement in overall quality of life, self-image, physical symptoms, and general health in studies assessing change in quality of life.  PHQ-9: Review Flowsheet  More data exists      09/28/2023 04/16/2019 04/13/2018 10/11/2017 03/17/2017  Depression screen PHQ 2/9  Decreased Interest 0 0 0 0 0  Down, Depressed, Hopeless 0 0 0 0 0  PHQ - 2 Score 0 0 0 0 0  Altered sleeping 1 - - - 0  Tired, decreased energy 1 - - - 0   Change in appetite 1 - - - 0  Feeling bad or failure about yourself  0 - - - 0  Trouble concentrating 0 - - - 0  Moving slowly or fidgety/restless 0 - - - 0  Suicidal thoughts 0 - - - 0  PHQ-9 Score 3 - - - 0  Difficult doing work/chores Not difficult at all - - - Not difficult at all   Interpretation of Total Score  Total Score Depression Severity:  1-4 = Minimal depression, 5-9 = Mild depression, 10-14 = Moderate depression, 15-19 = Moderately severe depression, 20-27 = Severe depression   Psychosocial Evaluation and Intervention:  Psychosocial Evaluation - 09/22/23 1352       Psychosocial Evaluation & Interventions   Interventions Encouraged to exercise with the program and follow exercise prescription    Comments There are no barriers to attending the program. He live with hs wife; she and his daughter are his support. He said hi wife is off and on with her health .   he is hoping to gain back the 10 pounds he lost in the surgery/hospital process. He has heard about the program and is attendng with the recommendation of family and his Careers adviser.    Expected Outcomes STG attend all scheduled sessions, work on exercise progression. LTG able to continue with exercise progression after discharge    Continue Psychosocial Services  Follow up required by staff          Psychosocial Re-Evaluation:  Psychosocial Re-Evaluation     Row  Name 10/26/23 9070 11/09/23 0933           Psychosocial Re-Evaluation   Current issues with None Identified None Identified      Comments Waylen denies any stress, anxiety, or depression at this time. he reports he sleeps well, though he wakes a few times to use bathroom Kenric denies any stress, anxiety or depression at this time. He says he has been sleeping well.      Expected Outcomes STG: Continue to attended rehab and focus on good sleep. LTG: Achieve and maintain and positive outlook on health and daily life STG: Continue to attended rehab and  focus on good sleep. LTG: Achieve and maintain and positive outlook on health and daily life      Interventions Encouraged to attend Cardiac Rehabilitation for the exercise Encouraged to attend Cardiac Rehabilitation for the exercise      Continue Psychosocial Services  Follow up required by staff Follow up required by staff         Psychosocial Discharge (Final Psychosocial Re-Evaluation):  Psychosocial Re-Evaluation - 11/09/23 0933       Psychosocial Re-Evaluation   Current issues with None Identified    Comments Peighton denies any stress, anxiety or depression at this time. He says he has been sleeping well.    Expected Outcomes STG: Continue to attended rehab and focus on good sleep. LTG: Achieve and maintain and positive outlook on health and daily life    Interventions Encouraged to attend Cardiac Rehabilitation for the exercise    Continue Psychosocial Services  Follow up required by staff          Vocational Rehabilitation: Provide vocational rehab assistance to qualifying candidates.   Vocational Rehab Evaluation & Intervention:   Education: Education Goals: Education classes will be provided on a variety of topics geared toward better understanding of heart health and risk factor modification. Participant will state understanding/return demonstration of topics presented as noted by education test scores.  Learning Barriers/Preferences:   General Cardiac Education Topics:  AED/CPR: - Group verbal and written instruction with the use of models to demonstrate the basic use of the AED with the basic ABC's of resuscitation.   Anatomy and Cardiac Procedures: - Group verbal and visual presentation and models provide information about basic cardiac anatomy and function. Reviews the testing methods done to diagnose heart disease and the outcomes of the test results. Describes the treatment choices: Medical Management, Angioplasty, or Coronary Bypass Surgery for treating  various heart conditions including Myocardial Infarction, Angina, Valve Disease, and Cardiac Arrhythmias.  Written material given at graduation. Flowsheet Row Cardiac Rehab from 11/24/2023 in [facility] Cardiac and Pulmonary Rehab  Date 10/19/23  Educator mc  Instruction Review Code 1- Verbalizes Understanding    Medication Safety: - Group verbal and visual instruction to review commonly prescribed medications for heart and lung disease. Reviews the medication, class of the drug, and side effects. Includes the steps to properly store meds and maintain the prescription regimen.  Written material given at graduation. Flowsheet Row Cardiac Rehab from 11/24/2023 in [facility] Cardiac and Pulmonary Rehab  Date 10/26/23  Educator sb  Instruction Review Code 1- Verbalizes Understanding    Intimacy: - Group verbal instruction through game format to discuss how heart and lung disease can affect sexual intimacy. Written material given at graduation..   Know Your Numbers and Heart Failure: - Group verbal and visual instruction to discuss disease risk factors for cardiac and pulmonary disease and treatment options.  Reviews associated critical  values for Overweight/Obesity, Hypertension, Cholesterol, and Diabetes.  Discusses basics of heart failure: signs/symptoms and treatments.  Introduces Heart Failure Zone chart for action plan for heart failure.  Written material given at graduation. Flowsheet Row Cardiac Rehab from 11/24/2023 in [facility] Cardiac and Pulmonary Rehab  Date 11/02/23  Educator sb  Instruction Review Code 1- Verbalizes Understanding    Infection Prevention: - Provides verbal and written material to individual with discussion of infection control including proper hand washing and proper equipment cleaning during exercise session. Flowsheet Row Cardiac Rehab from 11/24/2023 in [facility] Cardiac and Pulmonary Rehab  Date 09/28/23  Educator MB  Instruction Review Code 1- Verbalizes Understanding    Falls  Prevention: - Provides verbal and written material to individual with discussion of falls prevention and safety. Flowsheet Row Cardiac Rehab from 11/24/2023 in [facility] Cardiac and Pulmonary Rehab  Date 09/28/23  Educator MB  Instruction Review Code 1- Verbalizes Understanding    Other: -Provides group and verbal instruction on various topics (see comments)   Knowledge Questionnaire Score:  Knowledge Questionnaire Score - 09/28/23 1053       Knowledge Questionnaire Score   Pre Score 23/26          Core Components/Risk Factors/Patient Goals at Admission:  Personal Goals and Risk Factors at Admission - 09/28/23 1053       Core Components/Risk Factors/Patient Goals on Admission    Weight Management Yes;Weight Gain    Intervention Weight Management: Develop a combined nutrition and exercise program designed to reach desired caloric intake, while maintaining appropriate intake of nutrient and fiber, sodium and fats, and appropriate energy expenditure required for the weight goal.;Weight Management: Provide education and appropriate resources to help participant work on and attain dietary goals.;Weight Management/Obesity: Establish reasonable short term and long term weight goals.    Admit Weight 163 lb 14.4 oz (74.3 kg)    Goal Weight: Short Term 165 lb (74.8 kg)    Goal Weight: Long Term 170 lb (77.1 kg)    Expected Outcomes Short Term: Continue to assess and modify interventions until short term weight is achieved;Long Term: Adherence to nutrition and physical activity/exercise program aimed toward attainment of established weight goal;Understanding of distribution of calorie intake throughout the day with the consumption of 4-5 meals/snacks;Weight Gain: Understanding of general recommendations for a high calorie, high protein meal plan that promotes weight gain by distributing calorie intake throughout the day with the consumption for 4-5 meals, snacks, and/or supplements;Understanding  recommendations for meals to include 15-35% energy as protein, 25-35% energy from fat, 35-60% energy from carbohydrates, less than 200mg  of dietary cholesterol, 20-35 gm of total fiber daily    Diabetes Yes    Intervention Provide education about signs/symptoms and action to take for hypo/hyperglycemia.;Provide education about proper nutrition, including hydration, and aerobic/resistive exercise prescription along with prescribed medications to achieve blood glucose in normal ranges: Fasting glucose 65-99 mg/dL    Expected Outcomes Short Term: Participant verbalizes understanding of the signs/symptoms and immediate care of hyper/hypoglycemia, proper foot care and importance of medication, aerobic/resistive exercise and nutrition plan for blood glucose control.;Long Term: Attainment of HbA1C < 7%.    Hypertension Yes    Intervention Provide education on lifestyle modifcations including regular physical activity/exercise, weight management, moderate sodium restriction and increased consumption of fresh fruit, vegetables, and low fat dairy, alcohol moderation, and smoking cessation.;Monitor prescription use compliance.    Expected Outcomes Short Term: Continued assessment and intervention until BP is < 140/65mm HG in hypertensive participants. < 130/73mm  HG in hypertensive participants with diabetes, heart failure or chronic kidney disease.;Long Term: Maintenance of blood pressure at goal levels.    Lipids Yes    Intervention Provide education and support for participant on nutrition & aerobic/resistive exercise along with prescribed medications to achieve LDL 70mg , HDL >40mg .    Expected Outcomes Short Term: Participant states understanding of desired cholesterol values and is compliant with medications prescribed. Participant is following exercise prescription and nutrition guidelines.;Long Term: Cholesterol controlled with medications as prescribed, with individualized exercise RX and with personalized  nutrition plan. Value goals: LDL < 70mg , HDL > 40 mg.          Education:Diabetes - Individual verbal and written instruction to review signs/symptoms of diabetes, desired ranges of glucose level fasting, after meals and with exercise. Acknowledge that pre and post exercise glucose checks will be done for 3 sessions at entry of program. Flowsheet Row Cardiac Rehab from 11/24/2023 in [facility] Cardiac and Pulmonary Rehab  Date 09/28/23  Educator MB  Instruction Review Code 1- Verbalizes Understanding    Core Components/Risk Factors/Patient Goals Review:   Goals and Risk Factor Review     Row Name 10/26/23 0932 11/09/23 0935           Core Components/Risk Factors/Patient Goals Review   Personal Goals Review Hypertension;Diabetes Hypertension      Review Nelso reports he does not check his BP at home, but does check his blood sugars. He has been working to bring his A1C down below 7%. Based on his recent blood sugars he feels he is doing well and can bring his A1C down Jesten reports he is still working on building the habit of checking his BP at home more consisitently.      Expected Outcomes STG: Check BP at home. LTG: manage risk factors independently STG: Check BP at home. LTG: manage risk factors independently         Core Components/Risk Factors/Patient Goals at Discharge (Final Review):   Goals and Risk Factor Review - 11/09/23 0935       Core Components/Risk Factors/Patient Goals Review   Personal Goals Review Hypertension    Review Kellon reports he is still working on building the habit of checking his BP at home more consisitently.    Expected Outcomes STG: Check BP at home. LTG: manage risk factors independently          ITP Comments:  ITP Comments     Row Name 09/22/23 1356 09/28/23 1044 10/05/23 0946 10/05/23 0950 11/02/23 0942   ITP Comments Virtual orientation call completed today. he has an appointment on Date: 09/28/2023  for EP eval and gym Orientation.   Documentation of diagnosis can be found in [facility] 08/08/2023 . Completed and gym orientation for cardiac rehab. Initial ITP created and sent for review to Dr. Oneil Pinal, Medical Director. First full day of exercise!  Patient was oriented to gym and equipment including functions, settings, policies, and procedures.  Patient's individual exercise prescription and treatment plan were reviewed.  All starting workloads were established based on the results of the 6 minute walk test done at initial orientation visit.  The plan for exercise progression was also introduced and progression will be customized based on patient's performance and goals. 30 Day review completed. Medical Director ITP review done, changes made as directed, and signed approval by Medical Director.    new to program 30 Day review completed. Medical Director ITP review done, changes made as directed, and signed approval by  Medical Director.    Row Name 11/30/23 0823           ITP Comments 30 Day review completed. Medical Director ITP review done, changes made as directed, and signed approval by Medical Director.          Comments: 30 day review

## 2023-11-30 NOTE — Progress Notes (Signed)
 Daily Session Note  Patient Details  Name: Adam Frost MRN: [id number] Date of Birth: Sep 26, 1940 Referring Provider:   Flowsheet Row Cardiac Rehab from 09/28/2023 in [facility] Cardiac and Pulmonary Rehab  Referring Provider Ammon Blunt, MD    Encounter Date: 11/30/2023  Check In:  Session Check In - 11/30/23 0915       Check-In   Supervising physician immediately available to respond to emergencies See telemetry face sheet for immediately available ER MD    Location [facility]-Cardiac & Pulmonary Rehab    Staff Present Burnard Davenport RN,BSN,MPA;Joseph [facility] RCP,RRT,BSRT;Margaret Best, MS, Exercise Physiologist;Jason Elnor RDN,LDN;Noah Tickle, BS, Exercise Physiologist    Virtual Visit No    Medication changes reported     No    Fall or balance concerns reported    No    Warm-up and Cool-down Performed on first and last piece of equipment    Resistance Training Performed Yes    VAD Patient? No    PAD/SET Patient? No      Pain Assessment   Currently in Pain? No/denies             Social History   Tobacco Use  Smoking Status Never  Smokeless Tobacco Never    Goals Met:  Independence with exercise equipment Exercise tolerated well No report of concerns or symptoms today Strength training completed today  Goals Unmet:  Not Applicable  Comments: Pt able to follow exercise prescription today without complaint.  Will continue to monitor for progression.    Dr. Oneil Pinal is Medical Director for [facility].  Dr. Fuad Aleskerov is Medical Director for [facility] Pulmonary Rehabilitation.

## 2023-12-01 ENCOUNTER — Encounter: Admitting: *Deleted

## 2023-12-01 DIAGNOSIS — Z951 Presence of aortocoronary bypass graft: Secondary | ICD-10-CM

## 2023-12-01 DIAGNOSIS — Z48812 Encounter for surgical aftercare following surgery on the circulatory system: Secondary | ICD-10-CM | POA: Diagnosis not present

## 2023-12-01 NOTE — Progress Notes (Signed)
 Discharge Summary: Adam Frost DOB: [birth year]  Adam graduated today from  rehab with 36 sessions completed.  Details of the patient's exercise prescription and what He needs to do in order to continue the prescription and progress were discussed with patient.  Patient was given a copy of prescription and goals.  Patient verbalized understanding. [name] plans to continue to exercise by walking and using hand weights at home.   6 Minute Walk     Row Name 09/28/23 1044 11/23/23 0934       6 Minute Walk   Phase Initial Discharge    Distance 1140 feet 1400 feet    Distance % Change -- 22.8 %    Distance Feet Change -- 260 ft    Walk Time 6 minutes 6 minutes    # of Rest Breaks 0 0    MPH 2.16 2.65    METS 2.03 2.48    RPE 13 12    Perceived Dyspnea  0 1    VO2 Peak 7.09 8.67    Symptoms No No    Resting HR 52 bpm 51 bpm    Resting BP 140/60 144/64    Resting Oxygen Saturation  98 % 98 %    Exercise Oxygen Saturation  during 6 min walk 97 % 98 %    Max Ex. HR 72 bpm 75 bpm    Max Ex. BP 144/60 148/64    2 Minute Post BP 100/60 --

## 2023-12-01 NOTE — Progress Notes (Signed)
 Cardiac Individual Treatment Plan  Patient Details  Name: Adam Frost MRN: [id number] Date of Birth: [birth year] Referring Provider:   Flowsheet Row Cardiac Rehab from 09/28/2023 in [facility] Cardiac and Pulmonary Rehab  Referring Provider Ammon Blunt, MD    Initial Encounter Date:  Flowsheet Row Cardiac Rehab from 09/28/2023 in [facility] Cardiac and Pulmonary Rehab  Date 09/28/23    Visit Diagnosis: S/P CABG x 3  Patient's Home Medications on Admission:  Current Outpatient Medications:    aspirin  EC 325 MG tablet, Take 1 tablet (325 mg total) by mouth daily., Disp: , Rfl:    atorvastatin  (LIPITOR) 40 MG tablet, Take 1 tablet (40 mg total) by mouth daily., Disp: 90 tablet, Rfl: 4   glipiZIDE  (GLUCOTROL  XL) 5 MG 24 hr tablet, Take 5 mg by mouth daily with breakfast., Disp: , Rfl:    hydrochlorothiazide  (HYDRODIURIL ) 12.5 MG tablet, Take 12.5 mg by mouth daily., Disp: , Rfl:    lisinopril (ZESTRIL) 5 MG tablet, Take 5 mg by mouth daily., Disp: , Rfl:    metformin  (FORTAMET ) 1000 MG (OSM) 24 hr tablet, Take 1,000 mg by mouth daily with breakfast., Disp: , Rfl:    metoprolol  succinate (TOPROL -XL) 25 MG 24 hr tablet, Take 25 mg by mouth daily., Disp: , Rfl:    OVER THE COUNTER MEDICATION, Take 2 tablets by mouth daily. VitaFusion - Omega 3/EPA/DHEA/ALA, Disp: , Rfl:    Pediatric Multiple Vitamins (FLINSTONES GUMMIES OMEGA-3 DHA) CHEW, Chew 2 tablets by mouth daily., Disp: , Rfl:    traMADol  (ULTRAM ) 50 MG tablet, Take 1 tablet (50 mg total) by mouth every 6 (six) hours as needed for moderate pain (pain score 4-6). 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   Coronary artery disease    Diabetes  mellitus without complication (HCC)    Hemorrhoid    Hyperlipidemia    Hypertension     Tobacco Use: Social History   Tobacco Use  Smoking Status Never  Smokeless Tobacco Never    Labs: Review Flowsheet  More data exists      Latest Ref Rng & Units 10/11/2017 04/13/2018 10/10/2018 08/05/2023 08/08/2023  Labs for ITP Cardiac and Pulmonary Rehab  Cholestrol 100 - 199 mg/dL 844  835  - - -  LDL (calc) 0 - 99 mg/dL - 898  - - -  HDL-C >60 mg/dL - 49  - - -  Trlycerides 0 - 149 mg/dL 63  72  - - -  Hemoglobin A1c 4.8 - 5.6 % 6.3  6.3  6.5  6.8  -  PH, Arterial 7.35 - 7.45 - - - - 7.354  7.399  7.390  7.399  7.427  7.302  7.385  7.46   PCO2 arterial 32 - 48 mmHg - - - - 35.9  35.6  36.0  39.1  36.5  43.1  30.1  25   Bicarbonate 20.0 - 28.0 mmol/L - - - - 20.4  22.4  22.1  24.1  24.0  35.2  21.3  18.0  17.8   TCO2 22 - 32 mmol/L - - - - 22  24  23  25  25   25  23  37  23  21  19  21    Acid-base deficit 0.0 - 2.0 mmol/L - - - - 5.0  3.0  3.0  1.0  5.0  6.0  4.4   O2 Saturation % - - - - 99  99  98  100  100  86  100  100  99.9     Details       Multiple values from one day are sorted in reverse-chronological order          Exercise Target Goals: Exercise Program Goal: Individual exercise prescription set using results from initial 6 min walk test and THRR while considering  patient's activity barriers and safety.   Exercise Prescription Goal: Initial exercise prescription builds to 30-45 minutes a day of aerobic activity, 2-3 days per week.  Home exercise guidelines will be given to patient during program as part of exercise prescription that the participant will acknowledge.   Education: Aerobic Exercise: - Group verbal and visual presentation on the components of exercise prescription. Introduces F.I.T.T principle from ACSM for exercise prescriptions.  Reviews F.I.T.T. principles of aerobic exercise including progression. Written material given at graduation.   Education:  Resistance Exercise: - Group verbal and visual presentation on the components of exercise prescription. Introduces F.I.T.T principle from ACSM for exercise prescriptions  Reviews F.I.T.T. principles of resistance exercise including progression. Written material given at graduation. Flowsheet Row Cardiac Rehab from 11/30/2023 in [facility] Cardiac and Pulmonary Rehab  Date 11/30/23  Educator nt  Instruction Review Code 1- TEFL teacher Understanding     Education: Exercise & Equipment Safety: - Individual verbal instruction and demonstration of equipment use and safety with use of the equipment. Flowsheet Row Cardiac Rehab from 11/30/2023 in [facility] Cardiac and Pulmonary Rehab  Date 09/28/23  Educator MB  Instruction Review Code 1- Verbalizes Understanding    Education: Exercise Physiology & General Exercise Guidelines: - Group verbal and written instruction with models to review the exercise physiology of the cardiovascular system and associated critical values. Provides general exercise guidelines with specific guidelines to those with heart or lung disease.  Flowsheet Row Cardiac Rehab from 11/30/2023 in [facility] Cardiac and Pulmonary Rehab  Date 11/23/23  Educator nt  Instruction Review Code 1- TEFL teacher Understanding    Education: Flexibility, Balance, Mind/Body Relaxation: - Group verbal and visual presentation with interactive activity on the components of exercise prescription. Introduces F.I.T.T principle from ACSM for exercise prescriptions. Reviews F.I.T.T. principles of flexibility and balance exercise training including progression. Also discusses the mind body connection.  Reviews various relaxation techniques to help reduce and manage stress (i.e. Deep breathing, progressive muscle relaxation, and visualization). Balance handout provided to take home. Written material given at graduation. Flowsheet Row Cardiac Rehab from 11/30/2023 in [facility] Cardiac and Pulmonary Rehab  Date 11/30/23  Educator  nt  Instruction Review Code 1- Verbalizes Understanding    Activity Barriers & Risk Stratification:  Activity Barriers & Cardiac Risk Stratification - 09/28/23 1045       Activity Barriers & Cardiac Risk Stratification   Activity Barriers Back Problems;Assistive Device;Other (comment)    Comments Lifting restriction of no more than 10 lbs until 11/07/2023. Wears a back brace and uses a cane in public    Cardiac Risk Stratification High          6 Minute Walk:  6 Minute Walk     Row Name 09/28/23 1044 11/23/23 0934       6 Minute Walk   Phase Initial Discharge  Distance 1140 feet 1400 feet    Distance % Change -- 22.8 %    Distance Feet Change -- 260 ft    Walk Time 6 minutes 6 minutes    # of Rest Breaks 0 0    MPH 2.16 2.65    METS 2.03 2.48    RPE 13 12    Perceived Dyspnea  0 1    VO2 Peak 7.09 8.67    Symptoms No No    Resting HR 52 bpm 51 bpm    Resting BP 140/60 144/64    Resting Oxygen Saturation  98 % 98 %    Exercise Oxygen Saturation  during 6 min walk 97 % 98 %    Max Ex. HR 72 bpm 75 bpm    Max Ex. BP 144/60 148/64    2 Minute Post BP 100/60 --       Oxygen Initial Assessment:   Oxygen Re-Evaluation:   Oxygen Discharge (Final Oxygen Re-Evaluation):   Initial Exercise Prescription:  Initial Exercise Prescription - 09/28/23 1000       Date of Initial Exercise RX and Referring Provider   Date 09/28/23    Referring Provider Ammon Blunt, MD      Oxygen   Maintain Oxygen Saturation 88% or higher      Treadmill   MPH 1.4    Grade 0    Minutes 15    METs 2.07      Recumbant Bike   Level 2    RPM 50    Watts 25    Minutes 15    METs 2.03      NuStep   Level 2   T4 and T6   SPM 80    Minutes 15    METs 2.03      T5 Nustep   Level 2    SPM 80    Minutes 15    METs 2.03      Track   Laps 22    Minutes 15    METs 2.2      Prescription Details   Frequency (times per week) 3    Duration Progress to 30 minutes of  continuous aerobic without signs/symptoms of physical distress      Intensity   THRR 40-80% of Max Heartrate 85-119    Ratings of Perceived Exertion 11-13    Perceived Dyspnea 0-4      Progression   Progression Continue to progress workloads to maintain intensity without signs/symptoms of physical distress.      Resistance Training   Training Prescription Yes    Weight 3 lb    Reps 10-15          Perform Capillary Blood Glucose checks as needed.  Exercise Prescription Changes:   Exercise Prescription Changes     Row Name 09/28/23 1000 10/18/23 1000 11/03/23 1500 11/07/23 0900 11/16/23 1100     Response to Exercise   Blood Pressure (Admit) 140/60 132/74 128/76 -- 114/68   Blood Pressure (Exercise) 144/60 132/67 -- -- --   Blood Pressure (Exit) 100/60 104/64 98/60 -- 124/58   Heart Rate (Admit) 52 bpm 68 bpm 72 bpm -- 80 bpm   Heart Rate (Exercise) 72 bpm 100 bpm 97 bpm -- 99 bpm   Heart Rate (Exit) 56 bpm 60 bpm 59 bpm -- 57 bpm   Oxygen Saturation (Admit) 98 % -- -- -- --   Oxygen Saturation (Exercise) 97 % -- -- -- --   Oxygen  Saturation (Exit) 98 % -- -- -- --   Rating of Perceived Exertion (Exercise) 13 -- 15 -- 14   Perceived Dyspnea (Exercise) 0 -- -- -- --   Symptoms none none none -- none   Comments results First 2 weeks of exercise sessions -- -- --   Duration -- Continue with 30 min of aerobic exercise without signs/symptoms of physical distress. Continue with 30 min of aerobic exercise without signs/symptoms of physical distress. Continue with 30 min of aerobic exercise without signs/symptoms of physical distress. Continue with 30 min of aerobic exercise without signs/symptoms of physical distress.   Intensity THRR New THRR unchanged THRR unchanged THRR unchanged THRR unchanged     Progression   Progression -- Continue to progress workloads to maintain intensity without signs/symptoms of physical distress. Continue to progress workloads to maintain  intensity without signs/symptoms of physical distress. Continue to progress workloads to maintain intensity without signs/symptoms of physical distress. Continue to progress workloads to maintain intensity without signs/symptoms of physical distress.   Average METs 2.03 2.82 3.07 3.07 3.34     Resistance Training   Training Prescription -- Yes Yes Yes Yes   Weight -- 3 lb 3 lb 3 lb 3 lb   Reps -- 10-15 10-15 10-15 10-15     Interval Training   Interval Training -- No No No No     Treadmill   MPH -- 3 3 3 3    Grade -- 0 0 0 0   Minutes -- 15 15 15 15    METs -- 3.3 3.3 3.3 3.3     Recumbant Bike   Level -- 2 4 4 4    Watts -- 19 30 30 30    Minutes -- 15 15 15 15    METs -- 3.06 3.26 3.26 3.26     NuStep   Level -- 4  T6 5  T6: 4 5  T6: 4 6   Minutes -- 15 15 15 15    METs -- 2.2 4.7  T6: 2.5 4.7  T6: 2.5 6.4     T5 Nustep   Level -- 1 4 4 4   T5 and T6   Minutes -- 15 15 15 15    METs -- 2.1 2.5 2.5 3.2     Home Exercise Plan   Plans to continue exercise at -- -- -- Home (comment)  walk and use hand weights Home (comment)  walk and use hand weights   Frequency -- -- -- Add 2 additional days to program exercise sessions. Add 2 additional days to program exercise sessions.   Initial Home Exercises Provided -- -- -- 11/07/23 11/07/23     Oxygen   Maintain Oxygen Saturation -- 88% or higher 88% or higher 88% or higher 88% or higher    Row Name 11/29/23 1300             Response to Exercise   Blood Pressure (Admit) 146/58       Blood Pressure (Exercise) 138/52       Blood Pressure (Exit) 138/60       Heart Rate (Admit) 63 bpm       Heart Rate (Exercise) 92 bpm       Heart Rate (Exit) 59 bpm       Oxygen Saturation (Admit) 98 %       Oxygen Saturation (Exercise) 95 %       Oxygen Saturation (Exit) 95 %       Rating of Perceived Exertion (Exercise)  14       Symptoms none       Duration Continue with 30 min of aerobic exercise without signs/symptoms of physical distress.        Intensity THRR unchanged         Progression   Progression Continue to progress workloads to maintain intensity without signs/symptoms of physical distress.       Average METs 3.43         Resistance Training   Training Prescription Yes       Weight 3 lb       Reps 10-15         Interval Training   Interval Training No         Treadmill   MPH 3.1       Grade 0       Minutes 15       METs 3.37         Recumbant Bike   Level 5       Watts 37       Minutes 15       METs 3.22         NuStep   Level 5       Minutes 15       METs 4.4         T5 Nustep   Level 5  T5 and T6       Minutes 15       METs 2.9         Home Exercise Plan   Plans to continue exercise at Home (comment)  walk and use hand weights       Frequency Add 2 additional days to program exercise sessions.       Initial Home Exercises Provided 11/07/23         Oxygen   Maintain Oxygen Saturation 88% or higher          Exercise Comments:   Exercise Comments     Row Name 10/05/23 0946           Exercise Comments First full day of exercise!  Patient was oriented to gym and equipment including functions, settings, policies, and procedures.  Patient's individual exercise prescription and treatment plan were reviewed.  All starting workloads were established based on the results of the 6 minute walk test done at initial orientation visit.  The plan for exercise progression was also introduced and progression will be customized based on patient's performance and goals.          Exercise Goals and Review:   Exercise Goals     Row Name 09/28/23 1049             Exercise Goals   Increase Physical Activity Yes       Intervention Provide advice, education, support and counseling about physical activity/exercise needs.;Develop an individualized exercise prescription for aerobic and resistive training based on initial evaluation findings, risk stratification, comorbidities and participant's  personal goals.       Expected Outcomes Short Term: Attend rehab on a regular basis to increase amount of physical activity.;Long Term: Add in home exercise to make exercise part of routine and to increase amount of physical activity.;Long Term: Exercising regularly at least 3-5 days a week.       Increase Strength and Stamina Yes       Intervention Provide advice, education, support and counseling about physical activity/exercise needs.;Develop an individualized exercise prescription for aerobic and resistive training  based on initial evaluation findings, risk stratification, comorbidities and participant's personal goals.       Expected Outcomes Short Term: Increase workloads from initial exercise prescription for resistance, speed, and METs.;Short Term: Perform resistance training exercises routinely during rehab and add in resistance training at home;Long Term: Improve cardiorespiratory fitness, muscular endurance and strength as measured by increased METs and functional capacity ( )       Able to understand and use rate of perceived exertion (RPE) scale Yes       Intervention Provide education and explanation on how to use RPE scale       Expected Outcomes Short Term: Able to use RPE daily in rehab to express subjective intensity level;Long Term:  Able to use RPE to guide intensity level when exercising independently       Able to understand and use Dyspnea scale Yes       Intervention Provide education and explanation on how to use Dyspnea scale       Expected Outcomes Short Term: Able to use Dyspnea scale daily in rehab to express subjective sense of shortness of breath during exertion;Long Term: Able to use Dyspnea scale to guide intensity level when exercising independently       Knowledge and understanding of Target Heart Rate Range (THRR) Yes       Intervention Provide education and explanation of THRR including how the numbers were predicted and where they are located for reference        Expected Outcomes Short Term: Able to state/look up THRR;Short Term: Able to use daily as guideline for intensity in rehab;Long Term: Able to use THRR to govern intensity when exercising independently       Able to check pulse independently Yes       Intervention Provide education and demonstration on how to check pulse in carotid and radial arteries.;Review the importance of being able to check your own pulse for safety during independent exercise       Expected Outcomes Short Term: Able to explain why pulse checking is important during independent exercise;Long Term: Able to check pulse independently and accurately       Understanding of Exercise Prescription Yes       Intervention Provide education, explanation, and written materials on patient's individual exercise prescription       Expected Outcomes Short Term: Able to explain program exercise prescription;Long Term: Able to explain home exercise prescription to exercise independently          Exercise Goals Re-Evaluation :  Exercise Goals Re-Evaluation     Row Name 10/05/23 1115 10/18/23 1049 10/26/23 0922 11/03/23 1504 11/07/23 0947     Exercise Goal Re-Evaluation   Exercise Goals Review Increase Physical Activity;Able to understand and use rate of perceived exertion (RPE) scale;Knowledge and understanding of Target Heart Rate Range (THRR);Understanding of Exercise Prescription;Increase Strength and Stamina;Able to check pulse independently Increase Physical Activity;Understanding of Exercise Prescription;Increase Strength and Stamina Increase Physical Activity;Increase Strength and Stamina;Understanding of Exercise Prescription Increase Physical Activity;Increase Strength and Stamina;Understanding of Exercise Prescription Increase Physical Activity;Able to understand and use rate of perceived exertion (RPE) scale;Knowledge and understanding of Target Heart Rate Range (THRR);Understanding of Exercise Prescription;Increase Strength and  Stamina;Able to understand and use Dyspnea scale;Able to check pulse independently   Comments Reviewed RPE and dyspnea scale, THR and program prescription with pt today.  Pt voiced understanding and was given a copy of goals to take home. Adam Frost is off to a good start in the program and  he completed his first 2 weeks of exercise sessions in this review. He tolerated his exercise prescription well. HE was able to increase his workload on the treadmill to a speed of 3 mph with no incline. He also already increased to level 4 on the T6 nustep. He did well with level 2 on the recumbent bike and level 1 on the T5 nustep. We will continue to monitor his progress in the program. Adam Frost is doing well at rehab, he reports his legs and arms are weak but he still comes because he knows this will help them become stronger. He walks a mile at least one day of the week. But it has been very hot lately. Adam Frost is doing well in rehab. He has continued to walk on the treadmill at a speed of 3 mph with no incline. He also improved to level 5 on the T4 nustep machine and level 5 on the T5 nustep machine. We will continue to monitor his progress in the program. Reviewed home exercise with pt today.  Pt plans to walk and use hand weights for exercise.  Reviewed THR, pulse, RPE, sign and symptoms, pulse oximetery and when to call 911 or MD.  Also discussed weather considerations and indoor options.  Pt voiced understanding.   Expected Outcomes Short: Use RPE daily to regulate intensity. Long: Follow program prescription in THR. Short: Continue to follow current exercise prescription. Long: Continue exercise to improve strength and stamina. STG: Continue to attend rehab. LTG: Continue exercise to improve strength and stamina. Short: Continue to progressively increase treadmill workload. Long: Continue exercise to improve strength and stamina. Short: add 1-2 days a week of exercise at home on off days of cardiac rehab. Long: maintain  independent exercise routine upon graduation from cardiac rehab.    Row Name 11/09/23 0932 11/16/23 1147 11/29/23 1338         Exercise Goal Re-Evaluation   Exercise Goals Review Increase Physical Activity;Increase Strength and Stamina;Understanding of Exercise Prescription Increase Physical Activity;Increase Strength and Stamina;Understanding of Exercise Prescription Increase Physical Activity;Increase Strength and Stamina;Understanding of Exercise Prescription     Comments Adam Frost is doing well here at rehab, he is not doing much at home, walks some. He plans to do more once he finishes rehab. Adam Frost continues to do well in rehab. He is due for his post soon and hopes to improve. He increased to level 6 on the T4 nustep. He maintained level 4 on the T5 and T6 nusteps and recumbent bike. He also maintained his workload on the treadmill with a speed of 3 mph and no incline. We will continue to monitor his progress in the program. Adam Frost continues to do well in rehab and will graduate soon. He completed his post and improved by 22.8% with 1439ft. He increased his speed on the treadmill to 3.1 mph with no incline. He also increased to level 5 on the recumbent bike and T6 nustep. We will continue to monitor his progress in the program until graduation.     Expected Outcomes Short: add 1-2 days a week of exercise at home on off days of cardiac rehab. Long: maintain independent exercise routine upon graduation from cardiac rehab. Short: Improve on post . Long: Continue to increase overall METs and stamina. Short: Graduate. Long: Continue to exercise independently.        Discharge Exercise Prescription (Final Exercise Prescription Changes):  Exercise Prescription Changes - 11/29/23 1300       Response to Exercise  Blood Pressure (Admit) 146/58    Blood Pressure (Exercise) 138/52    Blood Pressure (Exit) 138/60    Heart Rate (Admit) 63 bpm    Heart Rate (Exercise) 92 bpm    Heart Rate  (Exit) 59 bpm    Oxygen Saturation (Admit) 98 %    Oxygen Saturation (Exercise) 95 %    Oxygen Saturation (Exit) 95 %    Rating of Perceived Exertion (Exercise) 14    Symptoms none    Duration Continue with 30 min of aerobic exercise without signs/symptoms of physical distress.    Intensity THRR unchanged      Progression   Progression Continue to progress workloads to maintain intensity without signs/symptoms of physical distress.    Average METs 3.43      Resistance Training   Training Prescription Yes    Weight 3 lb    Reps 10-15      Interval Training   Interval Training No      Treadmill   MPH 3.1    Grade 0    Minutes 15    METs 3.37      Recumbant Bike   Level 5    Watts 37    Minutes 15    METs 3.22      NuStep   Level 5    Minutes 15    METs 4.4      T5 Nustep   Level 5   T5 and T6   Minutes 15    METs 2.9      Home Exercise Plan   Plans to continue exercise at Home (comment)   walk and use hand weights   Frequency Add 2 additional days to program exercise sessions.    Initial Home Exercises Provided 11/07/23      Oxygen   Maintain Oxygen Saturation 88% or higher          Nutrition:  Target Goals: Understanding of nutrition guidelines, daily intake of sodium 1500mg , cholesterol 200mg , calories 30% from fat and 7% or less from saturated fats, daily to have 5 or more servings of fruits and vegetables.  Education: All About Nutrition: -Group instruction provided by verbal, written material, interactive activities, discussions, models, and posters to present general guidelines for heart healthy nutrition including fat, fiber, MyPlate, the role of sodium in heart healthy nutrition, utilization of the nutrition label, and utilization of this knowledge for meal planning. Follow up email sent as well. Written material given at graduation. Flowsheet Row Cardiac Rehab from 11/30/2023 in [facility] Cardiac and Pulmonary Rehab  Education need identified 09/28/23   Date 10/12/23  Educator jg part 2  Instruction Review Code 1- Verbalizes Understanding    Biometrics:  Pre Biometrics - 09/28/23 1050       Pre Biometrics   Height 5' 11.1 (1.806 m)    Weight 163 lb 14.4 oz (74.3 kg)    Waist Circumference 39.4 inches    Hip Circumference 38.5 inches    Waist to Hip Ratio 1.02 %    BMI (Calculated) 22.79    Single Leg Stand 4.3 seconds          Post Biometrics - 11/23/23 0938        Post  Biometrics   Height 5' 11.1 (1.806 m)    Weight 170 lb (77.1 kg)    Waist Circumference 35.5 inches    Hip Circumference 38 inches    Waist to Hip Ratio 0.93 %    BMI (Calculated) 23.64  Single Leg Stand 5.9 seconds          Nutrition Therapy Plan and Nutrition Goals:  Nutrition Therapy & Goals - 09/28/23 1052       Personal Nutrition Goals   Nutrition Goal Wants to think about scheduling appointment with RD in the coming weeks      Intervention Plan   Intervention Prescribe, educate and counsel regarding individualized specific dietary modifications aiming towards targeted core components such as weight, hypertension, lipid management, diabetes, heart failure and other comorbidities.    Expected Outcomes Short Term Goal: Understand basic principles of dietary content, such as calories, fat, sodium, cholesterol and nutrients.          Nutrition Assessments:  MEDIFICTS Score Key: >=70 Need to make dietary changes  40-70 Heart Healthy Diet <= 40 Therapeutic Level Cholesterol Diet  Flowsheet Row Cardiac Rehab from 09/28/2023 in [facility] Cardiac and Pulmonary Rehab  Picture Your Plate Total Score on Admission 55   Picture Your Plate Scores: <59 Unhealthy dietary pattern with much room for improvement. 41-50 Dietary pattern unlikely to meet recommendations for good health and room for improvement. 51-60 More healthful dietary pattern, with some room for improvement.  >60 Healthy dietary pattern, although there may be some specific  behaviors that could be improved.    Nutrition Goals Re-Evaluation:  Nutrition Goals Re-Evaluation     Row Name 10/26/23 0931 11/09/23 0934           Goals   Comment Adam Frost reports he is trying to eat heart healthy. reports he is monitoring his salt intake and making sure to drink enough water throughout the day Adam Frost says he is watching his salt intake and drinking ~48-64oz of water most days.      Expected Outcome STG: read facts labels and stay below 1500mg  Sodium and drink 48-64oz of water daily. LTG: Follow a heart healthy lifestyle STG: read facts labels and stay below 1500mg  Sodium and drink 48-64oz of water daily. LTG: Follow a heart healthy lifestyle         Nutrition Goals Discharge (Final Nutrition Goals Re-Evaluation):  Nutrition Goals Re-Evaluation - 11/09/23 0934       Goals   Comment Adam Frost says he is watching his salt intake and drinking ~48-64oz of water most days.    Expected Outcome STG: read facts labels and stay below 1500mg  Sodium and drink 48-64oz of water daily. LTG: Follow a heart healthy lifestyle          Psychosocial: Target Goals: Acknowledge presence or absence of significant depression and/or stress, maximize coping skills, provide positive support system. Participant is able to verbalize types and ability to use techniques and skills needed for reducing stress and depression.   Education: Stress, Anxiety, and Depression - Group verbal and visual presentation to define topics covered.  Reviews how body is impacted by stress, anxiety, and depression.  Also discusses healthy ways to reduce stress and to treat/manage anxiety and depression.  Written material given at graduation. Flowsheet Row Cardiac Rehab from 11/30/2023 in [facility] Cardiac and Pulmonary Rehab  Date 11/16/23  Educator sb  Instruction Review Code 1- [employer] Understanding    Education: Sleep Hygiene -Provides group verbal and written instruction about how sleep can affect your  health.  Define sleep hygiene, discuss sleep cycles and impact of sleep habits. Review good sleep hygiene tips.    Initial Review & Psychosocial Screening:  Initial Psych Review & Screening - 09/22/23 1343       Initial Review  Current issues with None Identified      Family Dynamics   Good Support System? Yes   wife, has problems off and on, daughter close by     Barriers   Psychosocial barriers to participate in program There are no identifiable barriers or psychosocial needs.      Screening Interventions   Interventions To provide support and resources with identified psychosocial needs;Provide feedback about the scores to participant    Expected Outcomes Short Term goal: Utilizing psychosocial counselor, staff and physician to assist with identification of specific Stressors or current issues interfering with healing process. Setting desired goal for each stressor or current issue identified.;Long Term Goal: Stressors or current issues are controlled or eliminated.;Short Term goal: Identification and review with participant of any Quality of Life or Depression concerns found by scoring the questionnaire.;Long Term goal: The participant improves quality of Life and PHQ9 Scores as seen by post scores and/or verbalization of changes          Quality of Life Scores:   Quality of Life - 09/28/23 1051       Quality of Life   Select Quality of Life      Quality of Life Scores   Health/Function Pre 22.77 %    Socioeconomic Pre 21.75 %    Psych/Spiritual Pre 23.57 %    Family Pre 22.5 %    GLOBAL Pre 22.66 %         Scores of 19 and below usually indicate a poorer quality of life in these areas.  A difference of  2-3 points is a clinically meaningful difference.  A difference of 2-3 points in the total score of the Quality of Life Index has been associated with significant improvement in overall quality of life, self-image, physical symptoms, and general health in studies  assessing change in quality of life.  PHQ-9: Review Flowsheet  More data exists      09/28/2023 04/16/2019 04/13/2018 10/11/2017 03/17/2017  Depression screen PHQ 2/9  Decreased Interest 0 0 0 0 0  Down, Depressed, Hopeless 0 0 0 0 0  PHQ - 2 Score 0 0 0 0 0  Altered sleeping 1 - - - 0  Tired, decreased energy 1 - - - 0  Change in appetite 1 - - - 0  Feeling bad or failure about yourself  0 - - - 0  Trouble concentrating 0 - - - 0  Moving slowly or fidgety/restless 0 - - - 0  Suicidal thoughts 0 - - - 0  PHQ-9 Score 3 - - - 0  Difficult doing work/chores Not difficult at all - - - Not difficult at all   Interpretation of Total Score  Total Score Depression Severity:  1-4 = Minimal depression, 5-9 = Mild depression, 10-14 = Moderate depression, 15-19 = Moderately severe depression, 20-27 = Severe depression   Psychosocial Evaluation and Intervention:  Psychosocial Evaluation - 09/22/23 1352       Psychosocial Evaluation & Interventions   Interventions Encouraged to exercise with the program and follow exercise prescription    Comments There are no barriers to attending the program. He live with hs wife; she and his daughter are his support. He said hi wife is off and on with her health .   he is hoping to gain back the 10 pounds he lost in the surgery/hospital process. He has heard about the program and is attendng with the recommendation of family and his Careers adviser.    Expected Outcomes  STG attend all scheduled sessions, work on exercise progression. LTG able to continue with exercise progression after discharge    Continue Psychosocial Services  Follow up required by staff          Psychosocial Re-Evaluation:  Psychosocial Re-Evaluation     Row Name 10/26/23 0929 11/09/23 0933           Psychosocial Re-Evaluation   Current issues with None Identified None Identified      Comments Adam Frost denies any stress, anxiety, or depression at this time. he reports he sleeps well,  though he wakes a few times to use bathroom Adam Frost denies any stress, anxiety or depression at this time. He says he has been sleeping well.      Expected Outcomes STG: Continue to attended rehab and focus on good sleep. LTG: Achieve and maintain and positive outlook on health and daily life STG: Continue to attended rehab and focus on good sleep. LTG: Achieve and maintain and positive outlook on health and daily life      Interventions Encouraged to attend Cardiac Rehabilitation for the exercise Encouraged to attend Cardiac Rehabilitation for the exercise      Continue Psychosocial Services  Follow up required by staff Follow up required by staff         Psychosocial Discharge (Final Psychosocial Re-Evaluation):  Psychosocial Re-Evaluation - 11/09/23 0933       Psychosocial Re-Evaluation   Current issues with None Identified    Comments Adam Frost denies any stress, anxiety or depression at this time. He says he has been sleeping well.    Expected Outcomes STG: Continue to attended rehab and focus on good sleep. LTG: Achieve and maintain and positive outlook on health and daily life    Interventions Encouraged to attend Cardiac Rehabilitation for the exercise    Continue Psychosocial Services  Follow up required by staff          Vocational Rehabilitation: Provide vocational rehab assistance to qualifying candidates.   Vocational Rehab Evaluation & Intervention:   Education: Education Goals: Education classes will be provided on a variety of topics geared toward better understanding of heart health and risk factor modification. Participant will state understanding/return demonstration of topics presented as noted by education test scores.  Learning Barriers/Preferences:   General Cardiac Education Topics:  AED/CPR: - Group verbal and written instruction with the use of models to demonstrate the basic use of the AED with the basic ABC's of resuscitation.   Anatomy and Cardiac  Procedures: - Group verbal and visual presentation and models provide information about basic cardiac anatomy and function. Reviews the testing methods done to diagnose heart disease and the outcomes of the test results. Describes the treatment choices: Medical Management, Angioplasty, or Coronary Bypass Surgery for treating various heart conditions including Myocardial Infarction, Angina, Valve Disease, and Cardiac Arrhythmias.  Written material given at graduation. Flowsheet Row Cardiac Rehab from 11/30/2023 in [facility] Cardiac and Pulmonary Rehab  Date 10/19/23  Educator mc  Instruction Review Code 1- Verbalizes Understanding    Medication Safety: - Group verbal and visual instruction to review commonly prescribed medications for heart and lung disease. Reviews the medication, class of the drug, and side effects. Includes the steps to properly store meds and maintain the prescription regimen.  Written material given at graduation. Flowsheet Row Cardiac Rehab from 11/30/2023 in [facility] Cardiac and Pulmonary Rehab  Date 10/26/23  Educator sb  Instruction Review Code 1- Verbalizes Understanding    Intimacy: - Group verbal instruction through  game format to discuss how heart and lung disease can affect sexual intimacy. Written material given at graduation..   Know Your Numbers and Heart Failure: - Group verbal and visual instruction to discuss disease risk factors for cardiac and pulmonary disease and treatment options.  Reviews associated critical values for Overweight/Obesity, Hypertension, Cholesterol, and Diabetes.  Discusses basics of heart failure: signs/symptoms and treatments.  Introduces Heart Failure Zone chart for action plan for heart failure.  Written material given at graduation. Flowsheet Row Cardiac Rehab from 11/30/2023 in [facility] Cardiac and Pulmonary Rehab  Date 11/02/23  Educator sb  Instruction Review Code 1- Verbalizes Understanding    Infection Prevention: - Provides verbal and  written material to individual with discussion of infection control including proper hand washing and proper equipment cleaning during exercise session. Flowsheet Row Cardiac Rehab from 11/30/2023 in [facility] Cardiac and Pulmonary Rehab  Date 09/28/23  Educator MB  Instruction Review Code 1- Verbalizes Understanding    Falls Prevention: - Provides verbal and written material to individual with discussion of falls prevention and safety. Flowsheet Row Cardiac Rehab from 11/30/2023 in [facility] Cardiac and Pulmonary Rehab  Date 09/28/23  Educator MB  Instruction Review Code 1- Verbalizes Understanding    Other: -Provides group and verbal instruction on various topics (see comments)   Knowledge Questionnaire Score:  Knowledge Questionnaire Score - 09/28/23 1053       Knowledge Questionnaire Score   Pre Score 23/26          Core Components/Risk Factors/Patient Goals at Admission:  Personal Goals and Risk Factors at Admission - 09/28/23 1053       Core Components/Risk Factors/Patient Goals on Admission    Weight Management Yes;Weight Gain    Intervention Weight Management: Develop a combined nutrition and exercise program designed to reach desired caloric intake, while maintaining appropriate intake of nutrient and fiber, sodium and fats, and appropriate energy expenditure required for the weight goal.;Weight Management: Provide education and appropriate resources to help participant work on and attain dietary goals.;Weight Management/Obesity: Establish reasonable short term and long term weight goals.    Admit Weight 163 lb 14.4 oz (74.3 kg)    Goal Weight: Short Term 165 lb (74.8 kg)    Goal Weight: Long Term 170 lb (77.1 kg)    Expected Outcomes Short Term: Continue to assess and modify interventions until short term weight is achieved;Long Term: Adherence to nutrition and physical activity/exercise program aimed toward attainment of established weight goal;Understanding of distribution of  calorie intake throughout the day with the consumption of 4-5 meals/snacks;Weight Gain: Understanding of general recommendations for a high calorie, high protein meal plan that promotes weight gain by distributing calorie intake throughout the day with the consumption for 4-5 meals, snacks, and/or supplements;Understanding recommendations for meals to include 15-35% energy as protein, 25-35% energy from fat, 35-60% energy from carbohydrates, less than 200mg  of dietary cholesterol, 20-35 gm of total fiber daily    Diabetes Yes    Intervention Provide education about signs/symptoms and action to take for hypo/hyperglycemia.;Provide education about proper nutrition, including hydration, and aerobic/resistive exercise prescription along with prescribed medications to achieve blood glucose in normal ranges: Fasting glucose 65-99 mg/dL    Expected Outcomes Short Term: Participant verbalizes understanding of the signs/symptoms and immediate care of hyper/hypoglycemia, proper foot care and importance of medication, aerobic/resistive exercise and nutrition plan for blood glucose control.;Long Term: Attainment of HbA1C < 7%.    Hypertension Yes    Intervention Provide education on lifestyle modifcations including  regular physical activity/exercise, weight management, moderate sodium restriction and increased consumption of fresh fruit, vegetables, and low fat dairy, alcohol moderation, and smoking cessation.;Monitor prescription use compliance.    Expected Outcomes Short Term: Continued assessment and intervention until BP is < 140/13mm HG in hypertensive participants. < 130/32mm HG in hypertensive participants with diabetes, heart failure or chronic kidney disease.;Long Term: Maintenance of blood pressure at goal levels.    Lipids Yes    Intervention Provide education and support for participant on nutrition & aerobic/resistive exercise along with prescribed medications to achieve LDL 70mg , HDL >40mg .    Expected  Outcomes Short Term: Participant states understanding of desired cholesterol values and is compliant with medications prescribed. Participant is following exercise prescription and nutrition guidelines.;Long Term: Cholesterol controlled with medications as prescribed, with individualized exercise RX and with personalized nutrition plan. Value goals: LDL < 70mg , HDL > 40 mg.          Education:Diabetes - Individual verbal and written instruction to review signs/symptoms of diabetes, desired ranges of glucose level fasting, after meals and with exercise. Acknowledge that pre and post exercise glucose checks will be done for 3 sessions at entry of program. Flowsheet Row Cardiac Rehab from 11/30/2023 in [facility] Cardiac and Pulmonary Rehab  Date 09/28/23  Educator MB  Instruction Review Code 1- Verbalizes Understanding    Core Components/Risk Factors/Patient Goals Review:   Goals and Risk Factor Review     Row Name 10/26/23 0932 11/09/23 0935           Core Components/Risk Factors/Patient Goals Review   Personal Goals Review Hypertension;Diabetes Hypertension      Review Adam Frost reports he does not check his BP at home, but does check his blood sugars. He has been working to bring his A1C down below 7%. Based on his recent blood sugars he feels he is doing well and can bring his A1C down Adam Frost reports he is still working on building the habit of checking his BP at home more consisitently.      Expected Outcomes STG: Check BP at home. LTG: manage risk factors independently STG: Check BP at home. LTG: manage risk factors independently         Core Components/Risk Factors/Patient Goals at Discharge (Final Review):   Goals and Risk Factor Review - 11/09/23 0935       Core Components/Risk Factors/Patient Goals Review   Personal Goals Review Hypertension    Review Adam Frost reports he is still working on building the habit of checking his BP at home more consisitently.    Expected Outcomes STG:  Check BP at home. LTG: manage risk factors independently          ITP Comments:  ITP Comments     Row Name 09/22/23 1356 09/28/23 1044 10/05/23 0946 10/05/23 0950 11/02/23 0942   ITP Comments Virtual orientation call completed today. he has an appointment on Date: 09/28/2023  for EP eval and gym Orientation.  Documentation of diagnosis can be found in [facility] 08/08/2023 . Completed and gym orientation for cardiac rehab. Initial ITP created and sent for review to Dr. Oneil Pinal, Medical Director. First full day of exercise!  Patient was oriented to gym and equipment including functions, settings, policies, and procedures.  Patient's individual exercise prescription and treatment plan were reviewed.  All starting workloads were established based on the results of the 6 minute walk test done at initial orientation visit.  The plan for exercise progression was also introduced and progression will be  customized based on patient's performance and goals. 30 Day review completed. Medical Director ITP review done, changes made as directed, and signed approval by Medical Director.    new to program 30 Day review completed. Medical Director ITP review done, changes made as directed, and signed approval by Medical Director.    Row Name 11/30/23 [phone] 12/01/23 1042         ITP Comments 30 Day review completed. Medical Director ITP review done, changes made as directed, and signed approval by Medical Director. Adam Frost graduated today from  rehab with 36 sessions completed.  Details of the patient's exercise prescription and what He needs to do in order to continue the prescription and progress were discussed with patient.  Patient was given a copy of prescription and goals.  Patient verbalized understanding. Adam Frost plans to continue to exercise by walking and using hand weights at home.         Comments: Discharge ITP

## 2023-12-01 NOTE — Progress Notes (Signed)
 Daily Session Note  Patient Details  Name: LEVANDER KATZENSTEIN MRN: [id number] Date of Birth: 25-Aug-1940 Referring Provider:   Flowsheet Row Cardiac Rehab from 09/28/2023 in [facility] Cardiac and Pulmonary Rehab  Referring Provider Ammon Blunt, MD    Encounter Date: 12/01/2023  Check In:  Session Check In - 12/01/23 1039       Check-In   Supervising physician immediately available to respond to emergencies See telemetry face sheet for immediately available ER MD    Location [facility]-Cardiac & Pulmonary Rehab    Staff Present Rollene Paterson, MS, Exercise Physiologist;Maxon Conetta BS, Exercise Physiologist;Joseph Rolinda RCP,RRT,BSRT;Jerrod Damiano RN,BSN    Virtual Visit No    Medication changes reported     No    Fall or balance concerns reported    No    Warm-up and Cool-down Performed on first and last piece of equipment    Resistance Training Performed Yes    VAD Patient? No    PAD/SET Patient? No      Pain Assessment   Currently in Pain? No/denies             Social History   Tobacco Use  Smoking Status Never  Smokeless Tobacco Never    Goals Met:  Independence with exercise equipment Exercise tolerated well No report of concerns or symptoms today Strength training completed today  Goals Unmet:  Not Applicable  Comments:  Dickey graduated today from  rehab with 36 sessions completed.  Details of the patient's exercise prescription and what He needs to do in order to continue the prescription and progress were discussed with patient.  Patient was given a copy of prescription and goals.  Patient verbalized understanding. Jeremiyah plans to continue to exercise by walking and using hand weights at home.    Dr. Oneil Pinal is Medical Director for [facility] Cardiac Rehabilitation.  Dr. Fuad Aleskerov is Medical Director for [facility] Pulmonary Rehabilitation.

## 2023-12-05 ENCOUNTER — Encounter

## 2023-12-07 ENCOUNTER — Encounter

## 2023-12-08 ENCOUNTER — Encounter

## 2023-12-12 ENCOUNTER — Encounter

## 2023-12-14 ENCOUNTER — Encounter

## 2023-12-15 ENCOUNTER — Encounter

## 2023-12-19 ENCOUNTER — Encounter

## 2023-12-21 ENCOUNTER — Encounter

## 2023-12-22 ENCOUNTER — Encounter

## 2023-12-26 ENCOUNTER — Encounter

## 2023-12-28 ENCOUNTER — Encounter

## 2023-12-29 ENCOUNTER — Encounter
